# Patient Record
Sex: Female | Born: 1995 | Race: Black or African American | Hispanic: Refuse to answer | Smoking: Current every day smoker
Health system: Southern US, Community
[De-identification: ages and names within clinical notes are randomized; demographics above are authoritative.]

## PROBLEM LIST (undated history)

## (undated) ENCOUNTER — Inpatient Hospital Stay (HOSPITAL_COMMUNITY): Payer: Self-pay

## (undated) ENCOUNTER — Emergency Department (HOSPITAL_COMMUNITY): Discharge status: Absent without leave | Payer: Medicaid Other

## (undated) DIAGNOSIS — O99345 Other mental disorders complicating the puerperium: Secondary | ICD-10-CM

## (undated) DIAGNOSIS — A64 Unspecified sexually transmitted disease: Secondary | ICD-10-CM

## (undated) DIAGNOSIS — F53 Postpartum depression: Secondary | ICD-10-CM

## (undated) DIAGNOSIS — Z889 Allergy status to unspecified drugs, medicaments and biological substances status: Secondary | ICD-10-CM

## (undated) HISTORY — PX: COSMETIC SURGERY: SHX468

---
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
## 2013-07-18 HISTORY — PX: WISDOM TOOTH EXTRACTION: SHX21

## 2013-08-19 ENCOUNTER — Emergency Department (HOSPITAL_COMMUNITY)
Admission: EM | Admit: 2013-08-19 | Discharge: 2013-08-20 | Disposition: A | Discharge status: Home / Self Care | Payer: Medicaid Other | Attending: Emergency Medicine | Admitting: Emergency Medicine

## 2013-08-19 ENCOUNTER — Encounter (HOSPITAL_COMMUNITY): Payer: Self-pay | Admitting: Emergency Medicine

## 2013-08-19 DIAGNOSIS — R109 Unspecified abdominal pain: Secondary | ICD-10-CM | POA: Insufficient documentation

## 2013-08-19 DIAGNOSIS — K08109 Complete loss of teeth, unspecified cause, unspecified class: Secondary | ICD-10-CM | POA: Insufficient documentation

## 2013-08-19 DIAGNOSIS — Z79899 Other long term (current) drug therapy: Secondary | ICD-10-CM | POA: Insufficient documentation

## 2013-08-19 DIAGNOSIS — K089 Disorder of teeth and supporting structures, unspecified: Secondary | ICD-10-CM | POA: Insufficient documentation

## 2013-08-19 DIAGNOSIS — K5900 Constipation, unspecified: Secondary | ICD-10-CM | POA: Insufficient documentation

## 2013-08-19 DIAGNOSIS — Z3202 Encounter for pregnancy test, result negative: Secondary | ICD-10-CM | POA: Insufficient documentation

## 2013-08-19 LAB — PREGNANCY, URINE: PREG TEST UR: NEGATIVE

## 2013-08-19 NOTE — ED Provider Notes (Signed)
CSN: [id number]     Arrival date & time 08/19/13  2233 History   First MD Initiated Contact with Patient 08/19/13 2317     Chief Complaint  Patient presents with  . Abdominal Pain  . Constipation     (Consider location/radiation/quality/duration/timing/severity/associated sxs/prior Treatment) HPI Hx per PT - presents with on and off ABD pains, sharp and aching, mid ABD and sometimes R sided, lasts for 1-2 minutes at a time, strong pain when it is presents.  Has had constipation for a long time, takes medications per PCP.  PT expresses worry about using Miralax daily.  She also expresses concern about her Gallbladder b/c her mother had her GB removed.  No F/C. Some intermittent nausea no emesis. Last BM 3 days ago with not a hard stool. No blood in stools. No pain right now. Taking vicodin currently for dental pain, recently had wisdom teeth removed.    Past Medical History  Diagnosis Date  . Postpartum depression    Past Surgical History  Procedure Laterality Date  . Wisdom tooth extraction Bilateral 07/2013    x4   History reviewed. No pertinent family history. History  Substance Use Topics  . Smoking status: Never Smoker   . Smokeless tobacco: Never Used  . Alcohol Use: No   OB History   Grav Para Term Preterm Abortions TAB SAB Ect Mult Living   1 1 1  0      1     Review of Systems  Constitutional: Negative for fever and chills.  Eyes: Negative for visual disturbance.  Respiratory: Negative for shortness of breath.   Cardiovascular: Negative for chest pain.  Gastrointestinal: Positive for abdominal pain. Negative for vomiting.  Genitourinary: Negative for dysuria.  Musculoskeletal: Negative for back pain.  Skin: Negative for rash.  Neurological: Negative for headaches.  All other systems reviewed and are negative.      Allergies  Review of patient's allergies indicates no known allergies.  Home Medications   Current Outpatient Rx  Name  Route  Sig  Dispense   Refill  . HYDROcodone-acetaminophen (NORCO/VICODIN) 5-325 MG per tablet   Oral   Take 1 tablet by mouth every 6 (six) hours as needed for moderate pain.         Marland Kitchen. ibuprofen (ADVIL,MOTRIN) 200 MG tablet   Oral   Take 800 mg by mouth every 6 (six) hours as needed for moderate pain.          BP 121/82  Pulse 74  Temp(Src) 98.9 F (37.2 C) (Oral)  Resp 16  SpO2 100% Physical Exam  Constitutional: She is oriented to person, place, and time. She appears well-developed and well-nourished.  HENT:  Head: Normocephalic and atraumatic.  Mouth/Throat: Oropharynx is clear and moist.  Eyes: EOM are normal. Pupils are equal, round, and reactive to light. No scleral icterus.  Neck: Neck supple.  Cardiovascular: Normal rate, regular rhythm and intact distal pulses.   Pulmonary/Chest: Effort normal and breath sounds normal. No respiratory distress.  Abdominal: Soft. Bowel sounds are normal. She exhibits no distension and no mass. There is no tenderness. There is no rebound and no guarding.  No CVAT  Musculoskeletal: Normal range of motion. She exhibits no edema.  Neurological: She is alert and oriented to person, place, and time. No cranial nerve deficit.  Skin: Skin is warm and dry.    ED Course  Procedures (including critical care time) Labs Review Labs Reviewed  URINALYSIS, ROUTINE W REFLEX MICROSCOPIC - Abnormal; Notable for  the following:    Hgb urine dipstick MODERATE (*)    All other components within normal limits  PREGNANCY, URINE  URINE MICROSCOPIC-ADD ON   Imaging Review No results found.  No current pain, abdominal tenderness or acute ABD.   Plan d/c home, continue Miralax as needed, PT agrees to ween herself from vicodin and take motrin as needed.  Return precautions provided and verbalized as understood.   MDM   Dx: Abdominal pain, h/o constipation  Urinalysis obtained and reviewed, no UTI.  No acute abdomen or indication for imaging at this time.  Vital signs  and nursing notes reviewed and considered.   Sunnie Nielsen, MD 08/20/13 [phone]

## 2013-08-19 NOTE — ED Notes (Signed)
MD at bedside. 

## 2013-08-19 NOTE — ED Notes (Signed)
Pt reports "shocking" pain to lower abdomen with constipation, states LBM unknown "but it's been a long while" was given medication by PCP for relief but has been unable to have a bowel movement. Pt reports nausea, denies vomiting. Pt also reports painful "bumps that pop and come back" to vaginal area. Pt a&o x4, NAD noted at this time.

## 2013-08-20 LAB — URINALYSIS, ROUTINE W REFLEX MICROSCOPIC
Bilirubin Urine: NEGATIVE
GLUCOSE, UA: NEGATIVE mg/dL
Ketones, ur: NEGATIVE mg/dL
Leukocytes, UA: NEGATIVE
Nitrite: NEGATIVE
PH: 7.5 (ref 5.0–8.0)
Protein, ur: NEGATIVE mg/dL
SPECIFIC GRAVITY, URINE: 1.022 (ref 1.005–1.030)
UROBILINOGEN UA: 1 mg/dL (ref 0.0–1.0)

## 2013-08-20 LAB — URINE MICROSCOPIC-ADD ON

## 2013-08-20 NOTE — Discharge Instructions (Signed)
Abdominal Pain, Adult °Many things can cause abdominal pain. Usually, abdominal pain is not caused by a disease and will improve without treatment. It can often be observed and treated at home. Your health care provider will do a physical exam and possibly order blood tests and X-rays to help determine the seriousness of your pain. However, in many cases, more time must pass before a clear cause of the pain can be found. Before that point, your health care provider may not know if you need more testing or further treatment. °HOME CARE INSTRUCTIONS  °Monitor your abdominal pain for any changes. The following actions may help to alleviate any discomfort you are experiencing: °· Only take over-the-counter or prescription medicines as directed by your health care provider. °· Do not take laxatives unless directed to do so by your health care provider. °· Try a clear liquid diet (broth, tea, or water) as directed by your health care provider. Slowly move to a bland diet as tolerated. °SEEK MEDICAL CARE IF: °· You have unexplained abdominal pain. °· You have abdominal pain associated with nausea or diarrhea. °· You have pain when you urinate or have a bowel movement. °· You experience abdominal pain that wakes you in the night. °· You have abdominal pain that is worsened or improved by eating food. °· You have abdominal pain that is worsened with eating fatty foods. °SEEK IMMEDIATE MEDICAL CARE IF:  °· Your pain does not go away within 2 hours. °· You have a fever. °· You keep throwing up (vomiting). °· Your pain is felt only in portions of the abdomen, such as the right side or the left lower portion of the abdomen. °· You pass bloody or black tarry stools. °MAKE SURE YOU: °· Understand these instructions.   °· Will watch your condition.   °· Will get help right away if you are not doing well or get worse.   °Document Released: 02/13/2005 Document Revised: 02/24/2013 Document Reviewed: 01/13/2013 °Exit[employer]® Patient  Information ©2014 [employer]. ° °Abdominal Pain, Women °Abdominal (stomach, pelvic, or belly) pain can be caused by many things. It is important to tell your doctor: °· The location of the pain. °· Does it come and go or is it present all the time? °· Are there things that start the pain (eating certain foods, exercise)? °· Are there other symptoms associated with the pain (fever, nausea, vomiting, diarrhea)? °All of this is helpful to know when trying to find the cause of the pain. °CAUSES  °· Stomach: virus or bacteria infection, or ulcer. °· Intestine: appendicitis (inflamed appendix), regional ileitis (Crohn's disease), ulcerative colitis (inflamed colon), irritable bowel syndrome, diverticulitis (inflamed diverticulum of the colon), or cancer of the stomach or intestine. °· Gallbladder disease or stones in the gallbladder. °· Kidney disease, kidney stones, or infection. °· Pancreas infection or cancer. °· Fibromyalgia (pain disorder). °· Diseases of the female organs: °· Uterus: fibroid (non-cancerous) tumors or infection. °· Fallopian tubes: infection or tubal pregnancy. °· Ovary: cysts or tumors. °· Pelvic adhesions (scar tissue). °· Endometriosis (uterus lining tissue growing in the pelvis and on the pelvic organs). °· Pelvic congestion syndrome (female organs filling up with blood just before the menstrual period). °· Pain with the menstrual period. °· Pain with ovulation (producing an egg). °· Pain with an IUD (intrauterine device, birth control) in the uterus. °· Cancer of the female organs. °· Functional pain (pain not caused by a disease, may improve without treatment). °· Psychological pain. °· Depression. °DIAGNOSIS  °Your doctor   will decide the seriousness of your pain by doing an examination. °· Blood tests. °· X-rays. °· Ultrasound. °· CT scan (computed tomography, special type of X-ray). °· MRI (magnetic resonance imaging). °· Cultures, for infection. °· Barium enema (dye inserted in the large  intestine, to better view it with X-rays). °· Colonoscopy (looking in intestine with a lighted tube). °· Laparoscopy (minor surgery, looking in abdomen with a lighted tube). °· Major abdominal exploratory surgery (looking in abdomen with a large incision). °TREATMENT  °The treatment will depend on the cause of the pain.  °· Many cases can be observed and treated at home. °· Over-the-counter medicines recommended by your caregiver. °· Prescription medicine. °· Antibiotics, for infection. °· Birth control pills, for painful periods or for ovulation pain. °· Hormone treatment, for endometriosis. °· Nerve blocking injections. °· Physical therapy. °· Antidepressants. °· Counseling with a psychologist or psychiatrist. °· Minor or major surgery. °HOME CARE INSTRUCTIONS  °· Do not take laxatives, unless directed by your caregiver. °· Take over-the-counter pain medicine only if ordered by your caregiver. Do not take aspirin because it can cause an upset stomach or bleeding. °· Try a clear liquid diet (broth or water) as ordered by your caregiver. Slowly move to a bland diet, as tolerated, if the pain is related to the stomach or intestine. °· Have a thermometer and take your temperature several times a day, and record it. °· Bed rest and sleep, if it helps the pain. °· Avoid sexual intercourse, if it causes pain. °· Avoid stressful situations. °· Keep your follow-up appointments and tests, as your caregiver orders. °· If the pain does not go away with medicine or surgery, you may try: °· Acupuncture. °· Relaxation exercises (yoga, meditation). °· Group therapy. °· Counseling. °SEEK MEDICAL CARE IF:  °· You notice certain foods cause stomach pain. °· Your home care treatment is not helping your pain. °· You need stronger pain medicine. °· You want your IUD removed. °· You feel faint or lightheaded. °· You develop nausea and vomiting. °· You develop a rash. °· You are having side effects or an allergy to your medicine. °SEEK  IMMEDIATE MEDICAL CARE IF:  °· Your pain does not go away or gets worse. °· You have a fever. °· Your pain is felt only in portions of the abdomen. The right side could possibly be appendicitis. The left lower portion of the abdomen could be colitis or diverticulitis. °· You are passing blood in your stools (bright red or black tarry stools, with or without vomiting). °· You have blood in your urine. °· You develop chills, with or without a fever. °· You pass out. °MAKE SURE YOU:  °· Understand these instructions. °· Will watch your condition. °· Will get help right away if you are not doing well or get worse. °Document Released: 03/03/2007 Document Revised: 07/29/2011 Document Reviewed: 03/23/2009 °ExitCare® Patient Information ©2014 [employer]. ° °

## 2014-01-05 ENCOUNTER — Encounter: Payer: Self-pay | Admitting: Obstetrics & Gynecology

## 2014-01-05 ENCOUNTER — Ambulatory Visit (INDEPENDENT_AMBULATORY_CARE_PROVIDER_SITE_OTHER): Payer: Medicaid Other | Admitting: Obstetrics & Gynecology

## 2014-01-05 VITALS — BP 137/81 | HR 76 | Ht 61.0 in | Wt 196.7 lb

## 2014-01-05 DIAGNOSIS — Z3009 Encounter for other general counseling and advice on contraception: Secondary | ICD-10-CM

## 2014-01-05 DIAGNOSIS — Z3046 Encounter for surveillance of implantable subdermal contraceptive: Secondary | ICD-10-CM

## 2014-01-05 NOTE — Progress Notes (Signed)
Subjective:     Patient ID: Heidi Estes, female   DOB: [birth year], 18 y.o.   MRN: [id number]  HPINexplanon in place for 12 months. Doesn't feel like herself, has recent bleeding, wants removal and no contraceptive for now. Extensively counseled to have expectant management but is adamant about her request so procedure done.   Review of Systems  Constitutional: Negative.   Genitourinary: Positive for menstrual problem. Negative for vaginal discharge and pelvic pain.  Psychiatric/Behavioral: Positive for behavioral problems.       Objective:   Physical Exam  Vitals reviewed. Constitutional: She is oriented to person, place, and time. She appears well-developed. No distress.  Pulmonary/Chest: Effort normal. No respiratory distress.  Neurological: She is alert and oriented to person, place, and time.  Skin: Skin is warm and dry.  Psychiatric: She has a normal mood and affect. Her behavior is normal.   Patient given informed consent for removal of her Nexplanon, time out was performed.  Signed copy in the chart.  Appropriate time out taken. Nexplanon site identified.  Area prepped in usual sterile fashon. One cc of 1% lidocaine was used to anesthetize the area at the distal end of the implant. A small stab incision was made right beside the implant on the distal portion.  The Nexplanon rod was grasped using hemostats and removed without difficulty.  There was less than 3 cc blood loss. There were no complications.   Steri-strips were applied over the small incision.  A pressure bandage was applied to reduce any bruising.  The patient tolerated the procedure well and was given post procedure instructions.    Assessment:     nexplanon removal     Plan:     Report problems or need for Rx contraception.  Adam PhenixJames G Catelyn Friel, MD 01/05/2014

## 2014-01-05 NOTE — Patient Instructions (Signed)
Etonogestrel implant What is this medicine? ETONOGESTREL (et oh noe JES trel) is a contraceptive (birth control) device. It is used to prevent pregnancy. It can be used for up to 3 years. This medicine may be used for other purposes; ask your health care provider or pharmacist if you have questions. COMMON BRAND NAME(S): Implanon, Nexplanon What should I tell my health care provider before I take this medicine? They need to know if you have any of these conditions: -abnormal vaginal bleeding -blood vessel disease or blood clots -cancer of the breast, cervix, or liver -depression -diabetes -gallbladder disease -headaches -heart disease or recent heart attack -high blood pressure -high cholesterol -kidney disease -liver disease -renal disease -seizures -tobacco smoker -an unusual or allergic reaction to etonogestrel, other hormones, anesthetics or antiseptics, medicines, foods, dyes, or preservatives -pregnant or trying to get pregnant -breast-feeding How should I use this medicine? This device is inserted just under the skin on the inner side of your upper arm by a health care professional. Talk to your pediatrician regarding the use of this medicine in children. Special care may be needed. Overdosage: If you think you've taken too much of this medicine contact a poison control center or emergency room at once. Overdosage: If you think you have taken too much of this medicine contact a poison control center or emergency room at once. NOTE: This medicine is only for you. Do not share this medicine with others. What if I miss a dose? This does not apply. What may interact with this medicine? Do not take this medicine with any of the following medications: -amprenavir -bosentan -fosamprenavir This medicine may also interact with the following medications: -barbiturate medicines for inducing sleep or treating seizures -certain medicines for fungal infections like ketoconazole and  itraconazole -griseofulvin -medicines to treat seizures like carbamazepine, felbamate, oxcarbazepine, phenytoin, topiramate -modafinil -phenylbutazone -rifampin -some medicines to treat HIV infection like atazanavir, indinavir, lopinavir, nelfinavir, tipranavir, ritonavir -St. John's wort This list may not describe all possible interactions. Give your health care provider a list of all the medicines, herbs, non-prescription drugs, or dietary supplements you use. Also tell them if you smoke, drink alcohol, or use illegal drugs. Some items may interact with your medicine. What should I watch for while using this medicine? This product does not protect you against HIV infection (AIDS) or other sexually transmitted diseases. You should be able to feel the implant by pressing your fingertips over the skin where it was inserted. Tell your doctor if you cannot feel the implant. What side effects may I notice from receiving this medicine? Side effects that you should report to your doctor or health care professional as soon as possible: -allergic reactions like skin rash, itching or hives, swelling of the face, lips, or tongue -breast lumps -changes in vision -confusion, trouble speaking or understanding -dark urine -depressed mood -general ill feeling or flu-like symptoms -light-colored stools -loss of appetite, nausea -right upper belly pain -severe headaches -severe pain, swelling, or tenderness in the abdomen -shortness of breath, chest pain, swelling in a leg -signs of pregnancy -sudden numbness or weakness of the face, arm or leg -trouble walking, dizziness, loss of balance or coordination -unusual vaginal bleeding, discharge -unusually weak or tired -yellowing of the eyes or skin Side effects that usually do not require medical attention (Report these to your doctor or health care professional if they continue or are bothersome.): -acne -breast pain -changes in  weight -cough -fever or chills -headache -irregular menstrual bleeding -itching, burning, and   vaginal discharge -pain or difficulty passing urine -sore throat This list may not describe all possible side effects. Call your doctor for medical advice about side effects. You may report side effects to FDA at 1-800-FDA-1088. Where should I keep my medicine? This drug is given in a hospital or clinic and will not be stored at home. NOTE: This sheet is a summary. It may not cover all possible information. If you have questions about this medicine, talk to your doctor, pharmacist, or health care provider.  2015, [employer]. (2011-11-11 15:37:45)  

## 2014-01-07 ENCOUNTER — Encounter: Payer: Self-pay | Admitting: General Practice

## 2014-03-04 ENCOUNTER — Emergency Department (HOSPITAL_COMMUNITY)
Admission: EM | Admit: 2014-03-04 | Discharge: 2014-03-04 | Disposition: A | Discharge status: Home / Self Care | Payer: Medicaid Other | Attending: Emergency Medicine | Admitting: Emergency Medicine

## 2014-03-04 ENCOUNTER — Encounter (HOSPITAL_COMMUNITY): Payer: Self-pay | Admitting: Emergency Medicine

## 2014-03-04 DIAGNOSIS — Z202 Contact with and (suspected) exposure to infections with a predominantly sexual mode of transmission: Secondary | ICD-10-CM | POA: Insufficient documentation

## 2014-03-04 DIAGNOSIS — K644 Residual hemorrhoidal skin tags: Secondary | ICD-10-CM | POA: Insufficient documentation

## 2014-03-04 DIAGNOSIS — K5900 Constipation, unspecified: Secondary | ICD-10-CM | POA: Insufficient documentation

## 2014-03-04 DIAGNOSIS — N760 Acute vaginitis: Secondary | ICD-10-CM | POA: Insufficient documentation

## 2014-03-04 DIAGNOSIS — B001 Herpesviral vesicular dermatitis: Secondary | ICD-10-CM | POA: Insufficient documentation

## 2014-03-04 DIAGNOSIS — B9689 Other specified bacterial agents as the cause of diseases classified elsewhere: Secondary | ICD-10-CM

## 2014-03-04 DIAGNOSIS — N898 Other specified noninflammatory disorders of vagina: Secondary | ICD-10-CM

## 2014-03-04 DIAGNOSIS — A6004 Herpesviral vulvovaginitis: Secondary | ICD-10-CM | POA: Diagnosis not present

## 2014-03-04 DIAGNOSIS — Z3202 Encounter for pregnancy test, result negative: Secondary | ICD-10-CM | POA: Insufficient documentation

## 2014-03-04 DIAGNOSIS — A6009 Herpesviral infection of other urogenital tract: Secondary | ICD-10-CM

## 2014-03-04 DIAGNOSIS — Z711 Person with feared health complaint in whom no diagnosis is made: Secondary | ICD-10-CM

## 2014-03-04 LAB — WET PREP, GENITAL
TRICH WET PREP: NONE SEEN
YEAST WET PREP: NONE SEEN

## 2014-03-04 LAB — URINALYSIS, ROUTINE W REFLEX MICROSCOPIC
Bilirubin Urine: NEGATIVE
Glucose, UA: NEGATIVE mg/dL
HGB URINE DIPSTICK: NEGATIVE
Ketones, ur: NEGATIVE mg/dL
Leukocytes, UA: NEGATIVE
NITRITE: NEGATIVE
PROTEIN: NEGATIVE mg/dL
Specific Gravity, Urine: 1.02 (ref 1.005–1.030)
UROBILINOGEN UA: 1 mg/dL (ref 0.0–1.0)
pH: 8 (ref 5.0–8.0)

## 2014-03-04 LAB — POC URINE PREG, ED: PREG TEST UR: NEGATIVE

## 2014-03-04 MED ORDER — CEFTRIAXONE SODIUM 250 MG IJ SOLR
250.0000 mg | Freq: Once | INTRAMUSCULAR | Status: AC
  Administered 2014-03-04: 250 mg via INTRAMUSCULAR
  Filled 2014-03-04: qty 250

## 2014-03-04 MED ORDER — POLYETHYLENE GLYCOL 3350 17 G PO PACK: 17.0000 g | PACK | Freq: Every day | ORAL | Status: DC

## 2014-03-04 MED ORDER — VALACYCLOVIR HCL 1 G PO TABS: 1000.0000 mg | ORAL_TABLET | Freq: Two times a day (BID) | ORAL | Status: AC

## 2014-03-04 MED ORDER — CEFTRIAXONE SODIUM 250 MG IJ SOLR: 250.0000 mg | Freq: Once | INTRAMUSCULAR | Status: DC

## 2014-03-04 MED ORDER — HYDROCORTISONE ACETATE 25 MG RE SUPP: 25.0000 mg | Freq: Two times a day (BID) | RECTAL | Status: DC

## 2014-03-04 MED ORDER — LIDOCAINE HCL 1 % IJ SOLN
INTRAMUSCULAR | Status: AC
  Administered 2014-03-04: 20:00:00
  Filled 2014-03-04: qty 20

## 2014-03-04 MED ORDER — AZITHROMYCIN 250 MG PO TABS
1000.0000 mg | ORAL_TABLET | Freq: Once | ORAL | Status: DC
Start: 2014-03-04 — End: 2014-03-04

## 2014-03-04 MED ORDER — AZITHROMYCIN 250 MG PO TABS
1000.0000 mg | ORAL_TABLET | Freq: Once | ORAL | Status: AC
  Administered 2014-03-04: 1000 mg via ORAL
  Filled 2014-03-04: qty 4

## 2014-03-04 MED ORDER — METRONIDAZOLE 500 MG PO TABS: 500.0000 mg | ORAL_TABLET | Freq: Two times a day (BID) | ORAL | Status: DC

## 2014-03-04 NOTE — Discharge Instructions (Signed)
Follow up with [facility] Department STD clinic for future STD concerns or screenings. This is the recommendation by the CDC for people with multiple sexual partners or hx of STDs. You have been treated for gonorrhea and chlamydia in the ER but the hospital will call you if lab is positive. You were tested for HIV and Syphilis, and the hospital will call you if the lab is positive. You're being treated for herpes given your symptoms, take valtrex as directed for this. For your constipation and recurrent hemorrhoids, increased your fiber and water intake, use miralax daily until you're having regular bowel movements. See the OBGYN listed above for ongoing management of your possible herpes. Return to the ER for changes or worsening symptoms.   Bacterial Vaginosis Bacterial vaginosis is a vaginal infection that occurs when the normal balance of bacteria in the vagina is disrupted. It results from an overgrowth of certain bacteria. This is the most common vaginal infection in women of childbearing age. Treatment is important to prevent complications, especially in pregnant women, as it can cause a premature delivery. CAUSES  Bacterial vaginosis is caused by an increase in harmful bacteria that are normally present in smaller amounts in the vagina. Several different kinds of bacteria can cause bacterial vaginosis. However, the reason that the condition develops is not fully understood. RISK FACTORS Certain activities or behaviors can put you at an increased risk of developing bacterial vaginosis, including:  Having a new sex partner or multiple sex partners.  Douching.  Using an intrauterine device (IUD) for contraception. Women do not get bacterial vaginosis from toilet seats, bedding, swimming pools, or contact with objects around them. SIGNS AND SYMPTOMS  Some women with bacterial vaginosis have no signs or symptoms. Common symptoms include:  Grey vaginal discharge.  A fishlike odor  with discharge, especially after sexual intercourse.  Itching or burning of the vagina and vulva.  Burning or pain with urination. DIAGNOSIS  Your health care provider will take a medical history and examine the vagina for signs of bacterial vaginosis. A sample of vaginal fluid may be taken. Your health care provider will look at this sample under a microscope to check for bacteria and abnormal cells. A vaginal pH test may also be done.  TREATMENT  Bacterial vaginosis may be treated with antibiotic medicines. These may be given in the form of a pill or a vaginal cream. A second round of antibiotics may be prescribed if the condition comes back after treatment.  HOME CARE INSTRUCTIONS   Only take over-the-counter or prescription medicines as directed by your health care provider.  If antibiotic medicine was prescribed, take it as directed. Make sure you finish it even if you start to feel better.  Do not have sex until treatment is completed.  Tell all sexual partners that you have a vaginal infection. They should see their health care provider and be treated if they have problems, such as a mild rash or itching.  Practice safe sex by using condoms and only having one sex partner. SEEK MEDICAL CARE IF:   Your symptoms are not improving after 3 days of treatment.  You have increased discharge or pain.  You have a fever. MAKE SURE YOU:   Understand these instructions.  Will watch your condition.  Will get help right away if you are not doing well or get worse. FOR MORE INFORMATION  Centers for Disease Control and Prevention, Division of STD Prevention: SolutionApps.co.za American Sexual Health Association (ASHA): www.ashastd.org  Document Released: 05/06/2005 Document Revised: 02/24/2013 Document Reviewed: 12/16/2012 [facility] Patient Information 2015 [place], [place]. This information is not intended to replace advice given to you by your health care provider. Make sure you discuss any  questions you have with your health care provider.  Constipation Constipation is when a person:  Poops (has a bowel movement) less than 3 times a week.  Has a hard time pooping.  Has poop that is dry, hard, or bigger than normal. HOME CARE   Eat foods with a lot of fiber in them. This includes fruits, vegetables, beans, and whole grains such as brown rice.  Avoid fatty foods and foods with a lot of sugar. This includes french fries, hamburgers, cookies, candy, and soda.  If you are not getting enough fiber from food, take products with added fiber in them (supplements).  Drink enough fluid to keep your pee (urine) clear or pale yellow.  Exercise on a regular basis, or as told by your doctor.  Go to the restroom when you feel like you need to poop. Do not hold it.  Only take medicine as told by your doctor. Do not take medicines that help you poop (laxatives) without talking to your doctor first. GET HELP RIGHT AWAY IF:   You have bright red blood in your poop (stool).  Your constipation lasts more than 4 days or gets worse.  You have belly (abdominal) or butt (rectal) pain.  You have thin poop (as thin as a pencil).  You lose weight, and it cannot be explained. MAKE SURE YOU:   Understand these instructions.  Will watch your condition.  Will get help right away if you are not doing well or get worse. Document Released: 10/23/2007 Document Revised: 05/11/2013 Document Reviewed: 02/15/2013 [facility] Patient Information 2015 [place]Care, [place]. This information is not intended to replace advice given to you by your health care provider. Make sure you discuss any questions you have with your health care provider.  Hemorrhoids Hemorrhoids are swollen veins around the rectum or anus. There are two types of hemorrhoids:   Internal hemorrhoids. These occur in the veins just inside the rectum. They may poke through to the outside and become irritated and painful.  External  hemorrhoids. These occur in the veins outside the anus and can be felt as a painful swelling or hard lump near the anus. CAUSES  Pregnancy.   Obesity.   Constipation or diarrhea.   Straining to have a bowel movement.   Sitting for long periods on the toilet.  Heavy lifting or other activity that caused you to strain.  Anal intercourse. SYMPTOMS   Pain.   Anal itching or irritation.   Rectal bleeding.   Fecal leakage.   Anal swelling.   One or more lumps around the anus.  DIAGNOSIS  Your caregiver may be able to diagnose hemorrhoids by visual examination. Other examinations or tests that may be performed include:   Examination of the rectal area with a gloved hand (digital rectal exam).   Examination of anal canal using a small tube (scope).   A blood test if you have lost a significant amount of blood.  A test to look inside the colon (sigmoidoscopy or colonoscopy). TREATMENT Most hemorrhoids can be treated at home. However, if symptoms do not seem to be getting better or if you have a lot of rectal bleeding, your caregiver may perform a procedure to help make the hemorrhoids get smaller or remove them completely. Possible treatments include:  Placing a rubber band at the base of the hemorrhoid to cut off the circulation (rubber band ligation).   Injecting a chemical to shrink the hemorrhoid (sclerotherapy).   Using a tool to burn the hemorrhoid (infrared light therapy).   Surgically removing the hemorrhoid (hemorrhoidectomy).   Stapling the hemorrhoid to block blood flow to the tissue (hemorrhoid stapling).  HOME CARE INSTRUCTIONS   Eat foods with fiber, such as whole grains, beans, nuts, fruits, and vegetables. Ask your doctor about taking products with added fiber in them (fibersupplements).  Increase fluid intake. Drink enough water and fluids to keep your urine clear or pale yellow.   Exercise regularly.   Go to the bathroom when  you have the urge to have a bowel movement. Do not wait.   Avoid straining to have bowel movements.   Keep the anal area dry and clean. Use wet toilet paper or moist towelettes after a bowel movement.   Medicated creams and suppositories may be used or applied as directed.   Only take over-the-counter or prescription medicines as directed by your caregiver.   Take warm sitz baths for 15-20 minutes, 3-4 times a day to ease pain and discomfort.   Place ice packs on the hemorrhoids if they are tender and swollen. Using ice packs between sitz baths may be helpful.   Put ice in a plastic bag.   Place a towel between your skin and the bag.   Leave the ice on for 15-20 minutes, 3-4 times a day.   Do not use a donut-shaped pillow or sit on the toilet for long periods. This increases blood pooling and pain.  SEEK MEDICAL CARE IF:  You have increasing pain and swelling that is not controlled by treatment or medicine.  You have uncontrolled bleeding.  You have difficulty or you are unable to have a bowel movement.  You have pain or inflammation outside the area of the hemorrhoids. MAKE SURE YOU:  Understand these instructions.  Will watch your condition.  Will get help right away if you are not doing well or get worse. Document Released: 05/03/2000 Document Revised: 04/22/2012 Document Reviewed: 03/10/2012 [facility]ExitCare Patient Information 2015 [place]ExitCare, [place]LL[place]. This information is not intended to replace advice given to you by your health care provider. Make sure you discuss any questions you have with your health care provider.  Genital Herpes Genital herpes is a sexually transmitted disease. This means that it is a disease passed by having sex with an infected person. There is no cure for genital herpes. The time between attacks can be months to years. The virus may live in a person but produce no problems (symptoms). This infection can be passed to a baby as it travels down the  birth canal (vagina). In a newborn, this can cause central nervous system damage, eye damage, or even death. The virus that causes genital herpes is usually HSV-2 virus. The virus that causes oral herpes is usually HSV-1. The diagnosis (learning what is wrong) is made through culture results. SYMPTOMS  Usually symptoms of pain and itching begin a few days to a week after contact. It first appears as small blisters that progress to small painful ulcers which then scab over and heal after several days. It affects the outer genitalia, birth canal, cervix, penis, anal area, buttocks, and thighs. HOME CARE INSTRUCTIONS   Keep ulcerated areas dry and clean.  Take medications as directed. Antiviral medications can speed up healing. They will not prevent recurrences or cure this  infection. These medications can also be taken for suppression if there are frequent recurrences.  While the infection is active, it is contagious. Avoid all sexual contact during active infections.  Condoms may help prevent spread of the herpes virus.  Practice safe sex.  Wash your hands thoroughly after touching the genital area.  Avoid touching your eyes after touching your genital area.  Inform your caregiver if you have had genital herpes and become pregnant. It is your responsibility to insure a safe outcome for your baby in this pregnancy.  Only take over-the-counter or prescription medicines for pain, discomfort, or fever as directed by your caregiver. SEEK MEDICAL CARE IF:   You have a recurrence of this infection.  You do not respond to medications and are not improving.  You have new sources of pain or discharge which have changed from the original infection.  You have an oral temperature above 102 F (38.9 C).  You develop abdominal pain.  You develop eye pain or signs of eye infection. Document Released: 05/03/2000 Document Revised: 07/29/2011 Document Reviewed: 05/24/2009 [facility] Patient  Information 2015 [place], [place]. This information is not intended to replace advice given to you by your health care provider. Make sure you discuss any questions you have with your health care provider.

## 2014-03-04 NOTE — ED Notes (Signed)
PA at bedside Pt alert and oriented x4. Respirations even and unlabored, bilateral symmetrical rise and fall of chest. Skin warm and dry. In no acute distress. Denies needs.   

## 2014-03-04 NOTE — ED Notes (Signed)
Pt c/o brown and white vaginal discharge without odor, swollen anus for couple of days now that she has noticed after having protection couple weeks ago.  Pt also noticed today bumps on her upper lip.  Pt states that she has been urinating more frequently than usual.

## 2014-03-04 NOTE — ED Provider Notes (Signed)
CSN: [id number]     Arrival date & time 03/04/14  1610 History   First MD Initiated Contact with Patient 03/04/14 1739     Chief Complaint  Patient presents with  . Vaginal Discharge  . buttock swollen   . bumps on lip      (Consider location/radiation/quality/duration/timing/severity/associated sxs/prior Treatment) HPI Comments: Heidi Estes is a [age] y.o. female with a PMHx of postpartum depression, who presents to the ED with complaints of 2 weeks of brownish white vaginal discharge as well as "bumps" on her labia x1 week and a concern for STD. Pt states she was recently sexually active with a new partner that started 2 wks ago, and during one sexual encounter she noticed he had removed the condom. Vaginal and oral penetration only, and these were unprotected. No other sexual partners in last 1 yr. States she has had the vaginal discharge since starting to have sex with this partner, denies odor to the discharge, denies vaginal itching or dyspareunia. The labial "bumps" occasionally tingle and burn, and she noticed them 1 week ago. She also noticed on her mouth she was getting cold sores intermittently which get better on their own. She also reports that she's been constipated recently and sometimes she has anal itching and a swollen area near her anus that will occasionally bleed on the toilet paper (bright red), but this resolves on it's own. Denies fevers, chills, CP, SOB, abd pain, n/v/d, obstipation, melena, dysuria, hematuria, urinary complaints, vaginal itching, myalgias or arthralgias. Denies anal penetration or painful bowel movements.   Patient is a 18 y.o. female presenting with vaginal discharge. The history is provided by the patient. No language interpreter was used.  Vaginal Discharge Quality:  Manson PasseyBrown and white Severity:  Mild Onset quality:  Gradual Duration:  2 weeks Timing:  Constant Progression:  Unchanged Chronicity:  New Context: spontaneously   Relieved by:  None  tried Worsened by:  Nothing tried Ineffective treatments:  None tried Associated symptoms: genital lesions   Associated symptoms: no abdominal pain, no dyspareunia, no dysuria, no fever, no nausea, no urinary frequency, no urinary hesitancy, no urinary incontinence, no vaginal itching and no vomiting   Risk factors: new sexual partner and unprotected sex     Past Medical History  Diagnosis Date  . Postpartum depression    Past Surgical History  Procedure Laterality Date  . Wisdom tooth extraction Bilateral 07/2013    x4   No family history on file. History  Substance Use Topics  . Smoking status: Never Smoker   . Smokeless tobacco: Never Used  . Alcohol Use: Yes     Comment: social   OB History   Grav Para Term Preterm Abortions TAB SAB Ect Mult Living   1 1 1  0      1     Review of Systems  Constitutional: Negative for fever and chills.  HENT: Positive for mouth sores. Negative for facial swelling.   Respiratory: Negative for shortness of breath.   Cardiovascular: Negative for chest pain.  Gastrointestinal: Positive for constipation and anal bleeding. Negative for nausea, vomiting, abdominal pain, diarrhea, blood in stool and rectal pain.  Genitourinary: Positive for vaginal discharge and genital sores. Negative for bladder incontinence, dysuria, hesitancy, urgency, hematuria, flank pain, vaginal bleeding, vaginal pain, pelvic pain and dyspareunia.  Musculoskeletal: Negative for arthralgias, back pain and myalgias.  Skin: Negative for color change.  Neurological: Negative for weakness, light-headedness and numbness.  Hematological: Negative for adenopathy.  10 Systems reviewed and are negative for acute change except as noted in the HPI.   Allergies  Review of patient's allergies indicates no known allergies.  Home Medications   Prior to Admission medications   Not on File   BP 146/94  Pulse 88  Temp(Src) 98.7 F (37.1 C) (Oral)  Resp 16  SpO2 99%  LMP  02/02/2014 Physical Exam  Nursing note and vitals reviewed. Constitutional: She is oriented to person, place, and time. Vital signs are normal. She appears well-developed and well-nourished. No distress.  Afebrile, nontoxic, well appearing  HENT:  Head: Normocephalic and atraumatic.  Mouth/Throat: Uvula is midline, oropharynx is clear and moist and mucous membranes are normal. Oral lesions present.    One cold sore at angle of R lips  Eyes: Conjunctivae and EOM are normal. Right eye exhibits no discharge. Left eye exhibits no discharge.  Neck: Normal range of motion. Neck supple.  Cardiovascular: Normal rate, regular rhythm, normal heart sounds and intact distal pulses.  Exam reveals no gallop and no friction rub.   No murmur heard. Pulmonary/Chest: Effort normal and breath sounds normal. No respiratory distress. She has no decreased breath sounds. She has no wheezes. She has no rhonchi. She has no rales.  Abdominal: Soft. Normal appearance and bowel sounds are normal. She exhibits no distension. There is no tenderness. There is no rigidity, no rebound, no guarding, no CVA tenderness, no tenderness at McBurney's point and negative Murphy's sign.  Soft, NTND, +BS throughout, no r/g/r, no CVA TTP, neg murphy's, neg mcburney's  Genitourinary: Uterus normal. Rectal exam shows external hemorrhoid (old hemorrhoidal tags). Rectal exam shows no internal hemorrhoid, no fissure, no mass, no tenderness and anal tone normal. Pelvic exam was performed with patient supine. There is lesion on the right labia. There is no rash, tenderness or injury on the right labia. There is lesion on the left labia. There is no rash or injury on the left labia. Cervix exhibits discharge and friability. Cervix exhibits no motion tenderness. Right adnexum displays no mass, no tenderness and no fullness. Left adnexum displays no mass, no tenderness and no fullness. No erythema, tenderness or bleeding around the vagina. Vaginal  discharge found.  Several small vesicular lesions noted to labia majora and mons, shaved labia. No rashes or tenderness to external genitalia. No erythema, injury, or tenderness to vaginal mucosa. Mild white discharge within vaginal vault, no bleeding within vaginal vault. No adnexal masses, tenderness, or fullness. No CMT, mild cervical friability and white discharge from cervical os. Uterus non-deviated, mobile, nonTTP, and without enlargement.  Old hemorrhoidal tissue noted at anus, no protrusion with valsalva, no internal hemorrhoids palpated, no anal fissures. No masses or rectal tenderness. No fecal mass. Good anal tone.  Musculoskeletal: Normal range of motion.  Neurological: She is alert and oriented to person, place, and time. She has normal strength. No sensory deficit.  Skin: Skin is warm, dry and intact. No rash noted.  Psychiatric: She has a normal mood and affect.    ED Course  Procedures (including critical care time) Labs Review Labs Reviewed  WET PREP, GENITAL - Abnormal; Notable for the following:    Clue Cells Wet Prep HPF POC FEW (*)    WBC, Wet Prep HPF POC FEW (*)    All other components within normal limits  GC/CHLAMYDIA PROBE AMP  URINALYSIS, ROUTINE W REFLEX MICROSCOPIC  RPR  HIV ANTIBODY (ROUTINE TESTING)  POC URINE PREG, ED    Imaging Review No results found.  EKG Interpretation None      MDM   Final diagnoses:  Vaginal discharge  BV (bacterial vaginosis)  Constipation, unspecified constipation type  Hemorrhoidal skin tag  Herpes genitalis in women  Concern about STD in female without diagnosis    17y/o female with vaginal discharge complaint. Also having "bumps" on labia that tingle, and some oral cold sores. Concern for HSV given pt's sexual history of recent unprotected sex and symptoms. Will rx for valtrex 1g BID x10d. Vaginal exam showing discharge, wet prep pending. Given empiric treatment for GC/CT here given her recent unprotected sex.  Old hemorrhoidal tissue noted, no rectal mass or concern for proctitis. Rx for anusol given hemorrhoidal tissue and complaints, but no hemorrhoids today. Encouraged fluid intake and fiber intake, and miralax for constipation. Doubt PID. Doubt need for imaging today. Will await wet prep and urine.  8:28 PM U/A WNL. Upreg neg. Wet prep revealing clue cells, will treat for BV. Remaining STD panel pending. Discussed f/up with OBGYN for possible herpes, but will start valtrex today. I explained the diagnosis and have given explicit precautions to return to the ER including for any other new or worsening symptoms. The patient understands and accepts the medical plan as it's been dictated and I have answered their questions. Discharge instructions concerning home care and prescriptions have been given. The patient is STABLE and is discharged to home in good condition.   BP 147/89  Pulse 95  Temp(Src) 98.7 F (37.1 C) (Oral)  Resp 17  SpO2 100%  LMP 02/02/2014  Meds ordered this encounter  Medications  . azithromycin (ZITHROMAX) tablet 1,000 mg    Sig:   . cefTRIAXone (ROCEPHIN) injection 250 mg    Sig:     Order Specific Question:  Antibiotic Indication:    Answer:  STD  . lidocaine (XYLOCAINE) 1 % (with pres) injection    Sig:     Bulala, Lilibeth   : cabinet override  . metroNIDAZOLE (FLAGYL) 500 MG tablet    Sig: Take 1 tablet (500 mg total) by mouth 2 (two) times daily. One po bid x 7 days    Dispense:  14 tablet    Refill:  0    Order Specific Question:  Supervising Provider    Answer:  Eber Hong D [[id number]]  . polyethylene glycol (MIRALAX / GLYCOLAX) packet    Sig: Take 17 g by mouth daily.    Dispense:  14 each    Refill:  0    Order Specific Question:  Supervising Provider    Answer:  Eber Hong D [[id number]]  . hydrocortisone (ANUSOL-HC) 25 MG suppository    Sig: Place 1 suppository (25 mg total) rectally 2 (two) times daily. For 7 days    Dispense:  14 suppository    Refill:   0    Order Specific Question:  Supervising Provider    Answer:  Eber Hong D [[id number]]  . valACYclovir (VALTREX) 1000 MG tablet    Sig: Take 1 tablet (1,000 mg total) by mouth 2 (two) times daily. x10 days    Dispense:  20 tablet    Refill:  0    Order Specific Question:  Supervising Provider    Answer:  Vida Roller [address] Gizelle Whetsel Camprubi-Soms, PA-C 03/04/14 2045

## 2014-03-05 LAB — GC/CHLAMYDIA PROBE AMP
CT Probe RNA: POSITIVE — AB
GC Probe RNA: NEGATIVE

## 2014-03-05 LAB — HIV ANTIBODY (ROUTINE TESTING W REFLEX): HIV 1&2 Ab, 4th Generation: NONREACTIVE

## 2014-03-05 LAB — RPR

## 2014-03-05 NOTE — ED Provider Notes (Signed)
Medical screening examination/treatment/procedure(s) were performed by non-physician practitioner and as supervising physician I was immediately available for consultation/collaboration.  Bayler Nehring L Cadden Elizondo, MD 03/05/14 0102 

## 2014-03-06 ENCOUNTER — Telehealth (HOSPITAL_BASED_OUTPATIENT_CLINIC_OR_DEPARTMENT_OTHER): Payer: Self-pay | Admitting: Emergency Medicine

## 2014-03-06 NOTE — Telephone Encounter (Signed)
pt returned call, ID verified, Notified of positive Chlamydia culture and that treatment was given while in ED. STD instructions provided, patient verbalized understanding.

## 2014-03-06 NOTE — Telephone Encounter (Signed)
Positive Chlamydia culture Treated with Zithromax and Rocephin per protocol MD [facility] faxed  Attempt to contact patient, left voicemail to return call to office #

## 2014-03-21 ENCOUNTER — Encounter (HOSPITAL_COMMUNITY): Payer: Self-pay | Admitting: Emergency Medicine

## 2014-04-24 ENCOUNTER — Encounter (HOSPITAL_COMMUNITY): Payer: Self-pay

## 2014-04-24 ENCOUNTER — Emergency Department (HOSPITAL_COMMUNITY)
Admission: EM | Admit: 2014-04-24 | Discharge: 2014-04-24 | Disposition: A | Discharge status: Home / Self Care | Payer: Medicaid Other | Attending: Emergency Medicine | Admitting: Emergency Medicine

## 2014-04-24 DIAGNOSIS — Z202 Contact with and (suspected) exposure to infections with a predominantly sexual mode of transmission: Secondary | ICD-10-CM | POA: Diagnosis not present

## 2014-04-24 DIAGNOSIS — Z8659 Personal history of other mental and behavioral disorders: Secondary | ICD-10-CM | POA: Insufficient documentation

## 2014-04-24 DIAGNOSIS — N898 Other specified noninflammatory disorders of vagina: Secondary | ICD-10-CM | POA: Diagnosis not present

## 2014-04-24 DIAGNOSIS — L293 Anogenital pruritus, unspecified: Secondary | ICD-10-CM | POA: Diagnosis present

## 2014-04-24 DIAGNOSIS — Z3202 Encounter for pregnancy test, result negative: Secondary | ICD-10-CM | POA: Diagnosis not present

## 2014-04-24 DIAGNOSIS — Z792 Long term (current) use of antibiotics: Secondary | ICD-10-CM | POA: Insufficient documentation

## 2014-04-24 DIAGNOSIS — Z8619 Personal history of other infectious and parasitic diseases: Secondary | ICD-10-CM | POA: Diagnosis not present

## 2014-04-24 DIAGNOSIS — Z79899 Other long term (current) drug therapy: Secondary | ICD-10-CM | POA: Insufficient documentation

## 2014-04-24 DIAGNOSIS — Z7952 Long term (current) use of systemic steroids: Secondary | ICD-10-CM | POA: Diagnosis not present

## 2014-04-24 LAB — WET PREP, GENITAL
Clue Cells Wet Prep HPF POC: NONE SEEN
TRICH WET PREP: NONE SEEN
Yeast Wet Prep HPF POC: NONE SEEN

## 2014-04-24 LAB — URINALYSIS, ROUTINE W REFLEX MICROSCOPIC
BILIRUBIN URINE: NEGATIVE
Glucose, UA: NEGATIVE mg/dL
Hgb urine dipstick: NEGATIVE
KETONES UR: NEGATIVE mg/dL
Nitrite: NEGATIVE
Protein, ur: NEGATIVE mg/dL
Specific Gravity, Urine: 1.013 (ref 1.005–1.030)
Urobilinogen, UA: 0.2 mg/dL (ref 0.0–1.0)
pH: 7 (ref 5.0–8.0)

## 2014-04-24 LAB — URINE MICROSCOPIC-ADD ON

## 2014-04-24 LAB — POC URINE PREG, ED: PREG TEST UR: NEGATIVE

## 2014-04-24 LAB — HIV ANTIBODY (ROUTINE TESTING W REFLEX): HIV: NONREACTIVE

## 2014-04-24 MED ORDER — CEFTRIAXONE SODIUM 250 MG IJ SOLR
250.0000 mg | Freq: Once | INTRAMUSCULAR | Status: AC
  Administered 2014-04-24: 250 mg via INTRAMUSCULAR
  Filled 2014-04-24: qty 250

## 2014-04-24 MED ORDER — METRONIDAZOLE 500 MG PO TABS
2000.0000 mg | ORAL_TABLET | ORAL | Status: AC
  Administered 2014-04-24: 2000 mg via ORAL
  Filled 2014-04-24: qty 4

## 2014-04-24 MED ORDER — LIDOCAINE HCL 1 % IJ SOLN
INTRAMUSCULAR | Status: AC
  Administered 2014-04-24: 2 mL
  Filled 2014-04-24: qty 20

## 2014-04-24 MED ORDER — AZITHROMYCIN 250 MG PO TABS
1000.0000 mg | ORAL_TABLET | Freq: Once | ORAL | Status: AC
  Administered 2014-04-24: 1000 mg via ORAL
  Filled 2014-04-24: qty 4

## 2014-04-24 MED ORDER — DOXYCYCLINE HYCLATE 100 MG PO CAPS: 100.0000 mg | ORAL_CAPSULE | Freq: Two times a day (BID) | ORAL | Status: AC

## 2014-04-24 NOTE — ED Notes (Signed)
Before walking pt back to the room, she reports that she did suffer from postpartum depression and she feels like those symptoms are coming back as well. Nurse made aware.

## 2014-04-24 NOTE — ED Notes (Signed)
Pt presents with c/o vaginal irritation and exposure to STD. Pt reports she had unprotected sex approx one day ago and wants to be checked for an STD. Treated for chlamydia approx one month ago. Pt denies any vaginal discharge at this time.

## 2014-04-24 NOTE — ED Notes (Signed)
Pelvic cart set up at bedside  

## 2014-04-24 NOTE — ED Provider Notes (Signed)
CSN: [id number]     Arrival date & time 04/24/14  1634 History   First MD Initiated Contact with Patient 04/24/14 1646     Chief Complaint  Patient presents with  . Vaginal irritation    . Exposure to STD     (Consider location/radiation/quality/duration/timing/severity/associated sxs/prior Treatment) Patient is a 18 y.o. female presenting with female genitourinary complaint. The history is provided by the patient.  Female GU Problem This is a new problem. The current episode started more than 2 days ago. The problem occurs constantly. The problem has not changed since onset.Nothing aggravates the symptoms. Nothing relieves the symptoms. She has tried nothing for the symptoms. The treatment provided no relief.    Past Medical History  Diagnosis Date  . Postpartum depression    Past Surgical History  Procedure Laterality Date  . Wisdom tooth extraction Bilateral 07/2013    x4   No family history on file. History  Substance Use Topics  . Smoking status: Never Smoker   . Smokeless tobacco: Never Used  . Alcohol Use: Yes     Comment: social   OB History    Gravida Para Term Preterm AB TAB SAB Ectopic Multiple Living   1 1 1  0      1     Review of Systems  Constitutional: Negative for fever and fatigue.  HENT: Negative for congestion and drooling.   Eyes: Negative for pain.  Respiratory: Negative for cough.   Gastrointestinal: Negative for nausea, vomiting and diarrhea.  Genitourinary: Positive for vaginal discharge and vaginal pain. Negative for dysuria and hematuria.  Musculoskeletal: Negative for back pain, gait problem and neck pain.  Skin: Negative for color change.  Neurological: Negative for dizziness.  Hematological: Negative for adenopathy.  Psychiatric/Behavioral: Negative for behavioral problems.  All other systems reviewed and are negative.     Allergies  Review of patient's allergies indicates no known allergies.  Home Medications   Prior to  Admission medications   Medication Sig Start Date End Date Taking? Authorizing Provider  hydrocortisone (ANUSOL-HC) 25 MG suppository Place 1 suppository (25 mg total) rectally 2 (two) times daily. For 7 days 03/04/14   Donnita FallsMercedes Strupp Camprubi-Soms, PA-C  metroNIDAZOLE (FLAGYL) 500 MG tablet Take 1 tablet (500 mg total) by mouth 2 (two) times daily. One po bid x 7 days 03/04/14   Donnita FallsMercedes Strupp Camprubi-Soms, PA-C  polyethylene glycol (MIRALAX / GLYCOLAX) packet Take 17 g by mouth daily. 03/04/14   Mercedes Strupp Camprubi-Soms, PA-C   BP 136/95 mmHg  Pulse 85  Temp(Src) 97.9 F (36.6 C) (Oral)  Resp 18  SpO2 100%  LMP 04/13/2014 (Approximate) Physical Exam  Constitutional: She is oriented to person, place, and time. She appears well-developed and well-nourished.  HENT:  Head: Normocephalic and atraumatic.  Mouth/Throat: Oropharynx is clear and moist. No oropharyngeal exudate.  Eyes: Conjunctivae and EOM are normal. Pupils are equal, round, and reactive to light.  Neck: Normal range of motion. Neck supple.  Cardiovascular: Normal rate, regular rhythm, normal heart sounds and intact distal pulses.  Exam reveals no gallop and no friction rub.   No murmur heard. Pulmonary/Chest: Effort normal and breath sounds normal. No respiratory distress. She has no wheezes.  Abdominal: Soft. Bowel sounds are normal. There is no tenderness. There is no rebound and no guarding.  Genitourinary:  Normal-appearing external vagina. Normal-appearing cervix. Small amount of milky thick fluid in the posterior fornix. Os closed. No cervical motion tenderness. Mild diffuse tenderness during the bimanual.  Musculoskeletal:  Normal range of motion. She exhibits no edema or tenderness.  Neurological: She is alert and oriented to person, place, and time.  Skin: Skin is warm and dry.  Psychiatric: She has a normal mood and affect. Her behavior is normal.  Nursing note and vitals reviewed.   ED Course  Procedures  (including critical care time) Labs Review Labs Reviewed  WET PREP, GENITAL - Abnormal; Notable for the following:    WBC, Wet Prep HPF POC TOO NUMEROUS TO COUNT (*)    All other components within normal limits  URINALYSIS, ROUTINE W REFLEX MICROSCOPIC - Abnormal; Notable for the following:    APPearance CLOUDY (*)    Leukocytes, UA SMALL (*)    All other components within normal limits  GC/CHLAMYDIA PROBE AMP  URINE MICROSCOPIC-ADD ON  HIV ANTIBODY (ROUTINE TESTING)  POC URINE PREG, ED    Imaging Review No results found.   EKG Interpretation None      MDM   Final diagnoses:  Vaginal discharge    5:15 PM 18 y.o. female with a self-reported history of chlamydia which was treated one month ago who presents with vaginal discharge and vaginal irritation 1 week. She notes that she had unprotected sex yesterday with a known partner. She is afebrile and vital signs are unremarkable here. She would like impaired treatment for STDs. I also offered HIV screening and she would like that as well. Will perform pelvic exam and screening urinalysis.  6:00 PM: Given mild pain during the bimanual exam and intermittent lower pelvic pain which she has endorsed over the last week, will cover for PID with doxycycline. I have discussed the diagnosis/risks/treatment options with the patient and believe the pt to be eligible for discharge home to follow-up with his pcp. We also discussed returning to the ED immediately if new or worsening sx occur. We discussed the sx which are most concerning (e.g., worsening pain, fever, continued vag d/c) that necessitate immediate return. Medications administered to the patient during their visit and any new prescriptions provided to the patient are listed below.  Medications given during this visit Medications  cefTRIAXone (ROCEPHIN) injection 250 mg (250 mg Intramuscular Given 04/24/14 1750)  metroNIDAZOLE (FLAGYL) tablet 2,000 mg (2,000 mg Oral Given 04/24/14  1750)  azithromycin (ZITHROMAX) tablet 1,000 mg (1,000 mg Oral Given 04/24/14 1750)  lidocaine (XYLOCAINE) 1 % (with pres) injection (2 mLs  Given 04/24/14 1751)    New Prescriptions   DOXYCYCLINE (VIBRAMYCIN) 100 MG CAPSULE    Take 1 capsule (100 mg total) by mouth 2 (two) times daily. One po bid x 14 days     Purvis SheffieldForrest Shakirah Kirkey, MD 04/24/14 [phone]

## 2014-04-25 LAB — GC/CHLAMYDIA PROBE AMP
CT Probe RNA: NEGATIVE
GC Probe RNA: NEGATIVE

## 2014-05-16 ENCOUNTER — Inpatient Hospital Stay (HOSPITAL_COMMUNITY)
Admission: AD | Admit: 2014-05-16 | Discharge: 2014-05-16 | Disposition: A | Discharge status: Home / Self Care | Payer: Medicaid Other | Source: Ambulatory Visit | Attending: Obstetrics & Gynecology | Admitting: Obstetrics & Gynecology

## 2014-05-16 ENCOUNTER — Encounter (HOSPITAL_COMMUNITY): Payer: Self-pay | Admitting: *Deleted

## 2014-05-16 DIAGNOSIS — B373 Candidiasis of vulva and vagina: Secondary | ICD-10-CM | POA: Diagnosis not present

## 2014-05-16 DIAGNOSIS — N899 Noninflammatory disorder of vagina, unspecified: Secondary | ICD-10-CM | POA: Diagnosis present

## 2014-05-16 DIAGNOSIS — B3731 Acute candidiasis of vulva and vagina: Secondary | ICD-10-CM

## 2014-05-16 DIAGNOSIS — B370 Candidal stomatitis: Secondary | ICD-10-CM | POA: Insufficient documentation

## 2014-05-16 LAB — WET PREP, GENITAL
CLUE CELLS WET PREP: NONE SEEN
TRICH WET PREP: NONE SEEN
Yeast Wet Prep HPF POC: NONE SEEN

## 2014-05-16 LAB — URINALYSIS, ROUTINE W REFLEX MICROSCOPIC
Glucose, UA: NEGATIVE mg/dL
KETONES UR: 40 mg/dL — AB
NITRITE: NEGATIVE
PROTEIN: NEGATIVE mg/dL
Specific Gravity, Urine: >1.03 — ABNORMAL HIGH (ref 1.005–1.030)
UROBILINOGEN UA: 0.2 mg/dL (ref 0.0–1.0)
pH: 6 (ref 5.0–8.0)

## 2014-05-16 LAB — URINE MICROSCOPIC-ADD ON

## 2014-05-16 LAB — GLUCOSE, CAPILLARY: GLUCOSE-CAPILLARY: 70 mg/dL (ref 70–99)

## 2014-05-16 LAB — HCG, SERUM, QUALITATIVE: Preg, Serum: NEGATIVE

## 2014-05-16 MED ORDER — FLUCONAZOLE 100 MG PO TABS: ORAL_TABLET | ORAL | Status: DC

## 2014-05-16 NOTE — MAU Provider Note (Signed)
Chief Complaint: Diarrhea; Vaginal Discharge; and Possible Pregnancy   First Provider Initiated Contact with Patient 05/16/14 1948     SUBJECTIVE HPI: Heidi Estes is a 18 y.o. G1P1001 who presents to maternity admissions reporting menses late by 1 week, pain with white patches in her mouth, and vaginal itching with thick white discharge.  She also has diarrhea, with family members recently sick with GI virus.  She is concerned that she may have thrush and a vaginal yeast infection.  She was positive in October for chlamydia and she and her partner were treated.  She was treated on 12/6 for possible PID with antibiotics and has been taking a course of Doxycyline to complete this treatment.  She reports being told she was borderline diabetic by a primary care provider within the last year.  She denies vaginal bleeding, urinary symptoms, h/a, dizziness, or fever/chills.  .   Past Medical History  Diagnosis Date  . Postpartum depression    Past Surgical History  Procedure Laterality Date  . Wisdom tooth extraction Bilateral 07/2013    x4   History   Social History  . Marital Status: Single    Spouse Name: N/A    Number of Children: N/A  . Years of Education: N/A   Occupational History  . Not on file.   Social History Main Topics  . Smoking status: Never Smoker   . Smokeless tobacco: Never Used  . Alcohol Use: No     Comment: social  . Drug Use: No  . Sexual Activity: Yes    Birth Control/ Protection: Condom, None   Other Topics Concern  . Not on file   Social History Narrative   No current facility-administered medications on file prior to encounter.   No current outpatient prescriptions on file prior to encounter.   No Known Allergies  ROS: Pertinent items in HPI  OBJECTIVE Blood pressure 123/89, pulse 90, temperature 99.9 F (37.7 C), temperature source Oral, resp. rate 16, last menstrual period 04/09/2014. GENERAL: Well-developed, well-nourished female in no  acute distress.  HEENT: Normocephalic Mouth: palate with mild erythema, tiny flecks of white which are mobile, with increased erythema underneath HEART: normal rate RESP: normal effort ABDOMEN: Soft, non-tender EXTREMITIES: Nontender, no edema NEURO: Alert and oriented Pelvic exam: Cervix pink, visually closed, without lesion, moderate amount thick white discharge, vaginal walls and external genitalia normal Bimanual exam: Cervix 0/long/high, firm, anterior, neg CMT, uterus nontender, nonenlarged, adnexa without tenderness, enlargement, or mass  LAB RESULTS Results for orders placed or performed during the hospital encounter of 05/16/14 (from the past 24 hour(s))  Urinalysis, Routine w reflex microscopic     Status: Abnormal   Collection Time: 05/16/14  6:55 PM  Result Value Ref Range   Color, Urine YELLOW YELLOW   APPearance CLOUDY (A) CLEAR   Specific Gravity, Urine >1.030 (H) 1.005 - 1.030   pH 6.0 5.0 - 8.0   Glucose, UA NEGATIVE NEGATIVE mg/dL   Hgb urine dipstick SMALL (A) NEGATIVE   Bilirubin Urine SMALL (A) NEGATIVE   Ketones, ur 40 (A) NEGATIVE mg/dL   Protein, ur NEGATIVE NEGATIVE mg/dL   Urobilinogen, UA 0.2 0.0 - 1.0 mg/dL   Nitrite NEGATIVE NEGATIVE   Leukocytes, UA TRACE (A) NEGATIVE  Urine microscopic-add on     Status: Abnormal   Collection Time: 05/16/14  6:55 PM  Result Value Ref Range   Squamous Epithelial / LPF MANY (A) RARE   WBC, UA 7-10 <3 WBC/hpf   RBC /  HPF 0-2 <3 RBC/hpf   Bacteria, UA MANY (A) RARE   Urine-Other MUCOUS PRESENT   Wet prep, genital     Status: Abnormal   Collection Time: 05/16/14  8:30 PM  Result Value Ref Range   Yeast Wet Prep HPF POC NONE SEEN NONE SEEN   Trich, Wet Prep NONE SEEN NONE SEEN   Clue Cells Wet Prep HPF POC NONE SEEN NONE SEEN   WBC, Wet Prep HPF POC FEW (A) NONE SEEN  Glucose, capillary     Status: None   Collection Time: 05/16/14  8:56 PM  Result Value Ref Range   Glucose-Capillary 70 70 - 99 mg/dL    ASSESSMENT 1. Oral thrush   2. Vaginal candidiasis     PLAN Discharge home Urine sent for culture Qualitative preg test pending, provider to call tonight if positive    Medication List    TAKE these medications        fluconazole 100 MG tablet  Commonly known as:  DIFLUCAN  Take 1 tablet daily for 3 days.       Follow-up Information    Please follow up.   Why:  With your Gyn provider as needed      Please follow up.   Why:  With your dental provider if oral symptoms persist      Sharen CounterLisa Leftwich-Kirby Certified Nurse-Midwife 05/16/2014  9:14 PM

## 2014-05-16 NOTE — MAU Note (Addendum)
Noticing that she has 'thrush' in her mouth.  White patches and burning in mouth for about a month.  Also noticing vaginal burning  And chunky white cheesy d/c for a month also. Diarrhea started yesterday. 6 or 7 since start, no fever, no vomiting. Everyone at home has been sick and throwing up, thinks they have a virus. Also realized she is a couple wks late with her cycle, neg HPT

## 2014-05-16 NOTE — MAU Note (Signed)
Pt also treated a month ago for Chlamydia.

## 2014-05-16 NOTE — Discharge Instructions (Signed)
Monilial Vaginitis Vaginitis in a soreness, swelling and redness (inflammation) of the vagina and vulva. Monilial vaginitis is not a sexually transmitted infection. CAUSES  Yeast vaginitis is caused by yeast (candida) that is normally found in your vagina. With a yeast infection, the candida has overgrown in number to a point that upsets the chemical balance. SYMPTOMS   White, thick vaginal discharge.  Swelling, itching, redness and irritation of the vagina and possibly the lips of the vagina (vulva).  Burning or painful urination.  Painful intercourse. DIAGNOSIS  Things that may contribute to monilial vaginitis are:  Postmenopausal and virginal states.  Pregnancy.  Infections.  Being tired, sick or stressed, especially if you had monilial vaginitis in the past.  Diabetes. Good control will help lower the chance.  Birth control pills.  Tight fitting garments.  Using bubble bath, feminine sprays, douches or deodorant tampons.  Taking certain medications that kill germs (antibiotics).  Sporadic recurrence can occur if you become ill. TREATMENT  Your caregiver will give you medication.  There are several kinds of anti monilial vaginal creams and suppositories specific for monilial vaginitis. For recurrent yeast infections, use a suppository or cream in the vagina 2 times a week, or as directed.  Anti-monilial or steroid cream for the itching or irritation of the vulva may also be used. Get your caregiver's permission.  Painting the vagina with methylene blue solution may help if the monilial cream does not work.  Eating yogurt may help prevent monilial vaginitis. HOME CARE INSTRUCTIONS   Finish all medication as prescribed.  Do not have sex until treatment is completed or after your caregiver tells you it is okay.  Take warm sitz baths.  Do not douche.  Do not use tampons, especially scented ones.  Wear cotton underwear.  Avoid tight pants and panty  hose.  Tell your sexual partner that you have a yeast infection. They should go to their caregiver if they have symptoms such as mild rash or itching.  Your sexual partner should be treated as well if your infection is difficult to eliminate.  Practice safer sex. Use condoms.  Some vaginal medications cause latex condoms to fail. Vaginal medications that harm condoms are:  Cleocin cream.  Butoconazole (Femstat).  Terconazole (Terazol) vaginal suppository.  Miconazole (Monistat) (may be purchased over the counter). SEEK MEDICAL CARE IF:   You have a temperature by mouth above 102 F (38.9 C).  The infection is getting worse after 2 days of treatment.  The infection is not getting better after 3 days of treatment.  You develop blisters in or around your vagina.  You develop vaginal bleeding, and it is not your menstrual period.  You have pain when you urinate.  You develop intestinal problems.  You have pain with sexual intercourse. Document Released: 02/13/2005 Document Revised: 07/29/2011 Document Reviewed: 10/28/2008 [facility] Patient Information 2015 [place], [place]. This information is not intended to replace advice given to you by your health care provider. Make sure you discuss any questions you have with your health care provider. Thrush, Adult  Ginette Pitman, also called oral candidiasis, is a fungal infection that develops in the mouth and throat and on the tongue. It causes white patches to form on the mouth and tongue. Ginette Pitman is most common in older adults, but it can occur at any age.  Many cases of thrush are mild, but this infection can also be more serious. Ginette Pitman can be a recurring problem for people who have chronic illnesses or who take medicines  that limit the body's ability to fight infection. Because these people have difficulty fighting infections, the fungus that causes thrush can spread throughout the body. This can cause life-threatening blood or organ  infections. CAUSES  Ginette Pitmanhrush is usually caused by a yeast called Candida albicans. This fungus is normally present in small amounts in the mouth and on other mucous membranes. It usually causes no harm. However, when conditions are present that allow the fungus to grow uncontrolled, it invades surrounding tissues and becomes an infection. Less often, other Candida species can also lead to thrush.  RISK FACTORS Ginette Pitmanhrush is more likely to develop in the following people:  People with an impaired ability to fight infection (weakened immune system).   Older adults.   People with HIV.   People with diabetes.   People with dry mouth (xerostomia).   Pregnant women.   People with poor dental care, especially those who have false teeth.   People who use antibiotic medicines.  SIGNS AND SYMPTOMS  Ginette Pitmanhrush can be a mild infection that causes no symptoms. If symptoms develop, they may include:   A burning feeling in the mouth and throat. This can occur at the start of a thrush infection.   White patches that adhere to the mouth and tongue. The tissue around the patches may be red, raw, and painful. If rubbed (during tooth brushing, for example), the patches and the tissue of the mouth may bleed easily.   A bad taste in the mouth or difficulty tasting foods.   Cottony feeling in the mouth.   Pain during eating and swallowing. DIAGNOSIS  Your health care provider can usually diagnose thrush by looking in your mouth and asking you questions about your health.  TREATMENT  Medicines that help prevent the growth of fungi (antifungals) are the standard treatment for thrush. These medicines are either applied directly to the affected area (topical) or swallowed (oral). The treatment will depend on the severity of the condition.  Mild Thrush Mild cases of thrush may clear up with the use of an antifungal mouth rinse or lozenges. Treatment usually lasts about 14 days.  Moderate to Severe  Thrush  More severe thrush infections that have spread to the esophagus are treated with an oral antifungal medicine. A topical antifungal medicine may also be used.   For some severe infections, a treatment period longer than 14 days may be needed.   Oral antifungal medicines are almost never used during pregnancy because the fetus may be harmed. However, if a pregnant woman has a rare, severe thrush infection that has spread to her blood, oral antifungal medicines may be used. In this case, the risk of harm to the mother and fetus from the severe thrush infection may be greater than the risk posed by the use of antifungal medicines.  Persistent or Recurrent Thrush For cases of thrush that do not go away or keep coming back, treatment may involve the following:   Treatment may be needed twice as long as the symptoms last.   Treatment will include both oral and topical antifungal medicines.   People with weakened immune systems can take an antifungal medicine on a continuous basis to prevent thrush infections.  It is important to treat conditions that make you more likely to get thrush, such as diabetes or HIV.  HOME CARE INSTRUCTIONS   Only take over-the-counter or prescription medicine as directed by your health care provider. Talk to your health care provider about an over-the-counter medicine called gentian  violet, which kills bacteria and fungi.   Eat plain, unflavored yogurt as directed by your health care provider. Check the label to make sure the yogurt contains live cultures. This yogurt can help healthy bacteria grow in the mouth that can stop the growth of the fungus that causes thrush.   Try these measures to help reduce the discomfort of thrush:   Drink cold liquids such as water or iced tea.   Try flavored ice treats or frozen juices.   Eat foods that are easy to swallow, such as gelatin, ice cream, or custard.   If the patches in your mouth are painful, try  drinking from a straw.   Rinse your mouth several times a day with a warm saltwater rinse. You can make the saltwater mixture with 1 tsp (6 g) of salt in 8 fl oz (0.2 L) of warm water.   If you wear dentures, remove the dentures before going to bed, brush them vigorously, and soak them in a cleaning solution as directed by your health care provider.   Women who are breastfeeding should clean their nipples with an antifungal medicine as directed by their health care provider. Dry the nipples after breastfeeding. Applying lanolin-containing body lotion may help relieve nipple soreness.  SEEK MEDICAL CARE IF:  Your symptoms are getting worse or are not improving within 7 days of starting treatment.   You have symptoms of spreading infection, such as white patches on the skin outside of the mouth.   You are nursing and you have redness, burning, or pain in the nipples that is not relieved with treatment.  MAKE SURE YOU:  Understand these instructions.  Will watch your condition.  Will get help right away if you are not doing well or get worse. Document Released: 01/30/2004 Document Revised: 02/24/2013 Document Reviewed: 12/07/2012 [facility] Patient Information 2015 [place]. This information is not intended to replace advice given to you by your health care provider. Make sure you discuss any questions you have with your health care provider.

## 2014-05-17 LAB — GC/CHLAMYDIA PROBE AMP
CT Probe RNA: NEGATIVE
GC PROBE AMP APTIMA: NEGATIVE

## 2014-05-17 LAB — URINE CULTURE: Colony Count: >=100000

## 2014-05-20 NOTE — L&D Delivery Note (Signed)
Delivery Note At 4:31 PM a viable female was delivered via Vaginal, Spontaneous Delivery (Presentation: Left Occiput Anterior).  APGAR: , ; weight  .   Placenta status: Intact, Spontaneous.  Cord: 3 vessels with the following complications: None.   Nuchal and body x 1 on delivery. Easily reduced.  Anesthesia: Epidural  Episiotomy: None Lacerations: None Suture Repair: n/a Est. Blood Loss (mL): 200  Mom to postpartum.  Baby to Couplet care / Skin to Skin.  Mickie Hillieran McKeag 03/04/2015, 4:44 PM

## 2014-07-09 ENCOUNTER — Emergency Department (HOSPITAL_COMMUNITY): Payer: Medicaid Other

## 2014-07-09 ENCOUNTER — Emergency Department (HOSPITAL_COMMUNITY)
Admission: EM | Admit: 2014-07-09 | Discharge: 2014-07-09 | Disposition: A | Discharge status: Home / Self Care | Payer: Medicaid Other | Attending: Emergency Medicine | Admitting: Emergency Medicine

## 2014-07-09 ENCOUNTER — Encounter (HOSPITAL_COMMUNITY): Payer: Self-pay | Admitting: Emergency Medicine

## 2014-07-09 DIAGNOSIS — O23591 Infection of other part of genital tract in pregnancy, first trimester: Secondary | ICD-10-CM | POA: Diagnosis not present

## 2014-07-09 DIAGNOSIS — Z3490 Encounter for supervision of normal pregnancy, unspecified, unspecified trimester: Secondary | ICD-10-CM

## 2014-07-09 DIAGNOSIS — Z3A01 Less than 8 weeks gestation of pregnancy: Secondary | ICD-10-CM | POA: Insufficient documentation

## 2014-07-09 DIAGNOSIS — O23511 Infections of cervix in pregnancy, first trimester: Secondary | ICD-10-CM | POA: Diagnosis not present

## 2014-07-09 DIAGNOSIS — R8271 Bacteriuria: Secondary | ICD-10-CM

## 2014-07-09 DIAGNOSIS — O9989 Other specified diseases and conditions complicating pregnancy, childbirth and the puerperium: Secondary | ICD-10-CM | POA: Diagnosis present

## 2014-07-09 DIAGNOSIS — R102 Pelvic and perineal pain: Secondary | ICD-10-CM

## 2014-07-09 DIAGNOSIS — N72 Inflammatory disease of cervix uteri: Secondary | ICD-10-CM

## 2014-07-09 DIAGNOSIS — Z79899 Other long term (current) drug therapy: Secondary | ICD-10-CM | POA: Diagnosis not present

## 2014-07-09 LAB — URINALYSIS, ROUTINE W REFLEX MICROSCOPIC
BILIRUBIN URINE: NEGATIVE
GLUCOSE, UA: NEGATIVE mg/dL
Hgb urine dipstick: NEGATIVE
KETONES UR: NEGATIVE mg/dL
Nitrite: NEGATIVE
PROTEIN: NEGATIVE mg/dL
SPECIFIC GRAVITY, URINE: 1.019 (ref 1.005–1.030)
Urobilinogen, UA: 0.2 mg/dL (ref 0.0–1.0)
pH: 7 (ref 5.0–8.0)

## 2014-07-09 LAB — CBC WITH DIFFERENTIAL/PLATELET
Basophils Absolute: 0 10*3/uL (ref 0.0–0.1)
Basophils Relative: 0 % (ref 0–1)
Eosinophils Absolute: 0.2 10*3/uL (ref 0.0–0.7)
Eosinophils Relative: 4 % (ref 0–5)
HCT: 39.2 % (ref 36.0–46.0)
Hemoglobin: 13.4 g/dL (ref 12.0–15.0)
LYMPHS PCT: 38 % (ref 12–46)
Lymphs Abs: 1.7 10*3/uL (ref 0.7–4.0)
MCH: 30.6 pg (ref 26.0–34.0)
MCHC: 34.2 g/dL (ref 30.0–36.0)
MCV: 89.5 fL (ref 78.0–100.0)
MONO ABS: 0.5 10*3/uL (ref 0.1–1.0)
MONOS PCT: 11 % (ref 3–12)
Neutro Abs: 2.1 10*3/uL (ref 1.7–7.7)
Neutrophils Relative %: 47 % (ref 43–77)
PLATELETS: 190 10*3/uL (ref 150–400)
RBC: 4.38 MIL/uL (ref 3.87–5.11)
RDW: 12.8 % (ref 11.5–15.5)
WBC: 4.6 10*3/uL (ref 4.0–10.5)

## 2014-07-09 LAB — COMPREHENSIVE METABOLIC PANEL WITH GFR
ALT: 12 U/L (ref 0–35)
AST: 15 U/L (ref 0–37)
Albumin: 3.8 g/dL (ref 3.5–5.2)
Alkaline Phosphatase: 34 U/L — ABNORMAL LOW (ref 39–117)
Anion gap: 6 (ref 5–15)
BUN: <5 mg/dL — ABNORMAL LOW (ref 6–23)
CO2: 24 mmol/L (ref 19–32)
Calcium: 9.2 mg/dL (ref 8.4–10.5)
Chloride: 104 mmol/L (ref 96–112)
Creatinine, Ser: 0.71 mg/dL (ref 0.50–1.10)
GFR calc Af Amer: >90 mL/min
GFR calc non Af Amer: >90 mL/min
Glucose, Bld: 104 mg/dL — ABNORMAL HIGH (ref 70–99)
Potassium: 3.6 mmol/L (ref 3.5–5.1)
Sodium: 134 mmol/L — ABNORMAL LOW (ref 135–145)
Total Bilirubin: 0.5 mg/dL (ref 0.3–1.2)
Total Protein: 7 g/dL (ref 6.0–8.3)

## 2014-07-09 LAB — URINE MICROSCOPIC-ADD ON

## 2014-07-09 LAB — LIPASE, BLOOD: Lipase: 24 U/L (ref 11–59)

## 2014-07-09 LAB — WET PREP, GENITAL
Clue Cells Wet Prep HPF POC: NONE SEEN
Trich, Wet Prep: NONE SEEN
Yeast Wet Prep HPF POC: NONE SEEN

## 2014-07-09 LAB — HCG, QUANTITATIVE, PREGNANCY: hCG, Beta Chain, Quant, S: 78462 m[IU]/mL — ABNORMAL HIGH

## 2014-07-09 LAB — I-STAT BETA HCG BLOOD, ED (MC, WL, AP ONLY)

## 2014-07-09 LAB — POC URINE PREG, ED: Preg Test, Ur: POSITIVE — AB

## 2014-07-09 MED ORDER — NITROFURANTOIN MONOHYD MACRO 100 MG PO CAPS: 100.0000 mg | ORAL_CAPSULE | Freq: Two times a day (BID) | ORAL | Status: DC

## 2014-07-09 MED ORDER — LIDOCAINE HCL (PF) 1 % IJ SOLN
2.0000 mL | Freq: Once | INTRAMUSCULAR | Status: AC
  Administered 2014-07-09: 0.9 mL
  Filled 2014-07-09: qty 5

## 2014-07-09 MED ORDER — CEFTRIAXONE SODIUM 250 MG IJ SOLR
250.0000 mg | Freq: Once | INTRAMUSCULAR | Status: AC
  Administered 2014-07-09: 250 mg via INTRAMUSCULAR
  Filled 2014-07-09: qty 250

## 2014-07-09 MED ORDER — AZITHROMYCIN 1 G PO PACK
1.0000 g | PACK | Freq: Once | ORAL | Status: AC
  Administered 2014-07-09: 1 g via ORAL
  Filled 2014-07-09: qty 1

## 2014-07-09 MED ORDER — ACETAMINOPHEN 500 MG PO TABS
1000.0000 mg | ORAL_TABLET | Freq: Once | ORAL | Status: AC
  Administered 2014-07-09: 1000 mg via ORAL
  Filled 2014-07-09: qty 2

## 2014-07-09 NOTE — ED Notes (Addendum)
Pt has given urine sample if needed.

## 2014-07-09 NOTE — ED Notes (Signed)
Pt from home c/o lower abdominal pain several months. She reports anal itching, vaginal discharge, and "ulcers" to vaginal area. She reports unprotected sexual intercourse with one person "this past week".

## 2014-07-09 NOTE — Discharge Instructions (Signed)
Pelvic Pain °Female pelvic pain can be caused by many different things and start from a variety of places. Pelvic pain refers to pain that is located in the lower half of the abdomen and between your hips. The pain may occur over a short period of time (acute) or may be reoccurring (chronic). The cause of pelvic pain may be related to disorders affecting the female reproductive organs (gynecologic), but it may also be related to the bladder, kidney stones, an intestinal complication, or muscle or skeletal problems. Getting help right away for pelvic pain is important, especially if there has been severe, sharp, or a sudden onset of unusual pain. It is also important to get help right away because some types of pelvic pain can be life threatening.  °CAUSES  °Below are only some of the causes of pelvic pain. The causes of pelvic pain can be in one of several categories.  °· Gynecologic. °· Pelvic inflammatory disease. °· Sexually transmitted infection. °· Ovarian cyst or a twisted ovarian ligament (ovarian torsion). °· Uterine lining that grows outside the uterus (endometriosis). °· Fibroids, cysts, or tumors. °· Ovulation. °· Pregnancy. °· Pregnancy that occurs outside the uterus (ectopic pregnancy). °· Miscarriage. °· Labor. °· Abruption of the placenta or ruptured uterus. °· Infection. °· Uterine infection (endometritis). °· Bladder infection. °· Diverticulitis. °· Miscarriage related to a uterine infection (septic abortion). °· Bladder. °· Inflammation of the bladder (cystitis). °· Kidney stone(s). °· Gastrointestinal. °· Constipation. °· Diverticulitis. °· Neurologic. °· Trauma. °· Feeling pelvic pain because of mental or emotional causes (psychosomatic). °· Cancers of the bowel or pelvis. °EVALUATION  °Your caregiver will want to take a careful history of your concerns. This includes recent changes in your health, a careful gynecologic history of your periods (menses), and a sexual history. Obtaining your family  history and medical history is also important. Your caregiver may suggest a pelvic exam. A pelvic exam will help identify the location and severity of the pain. It also helps in the evaluation of which organ system may be involved. In order to identify the cause of the pelvic pain and be properly treated, your caregiver may order tests. These tests may include:  °· A pregnancy test. °· Pelvic ultrasonography. °· An X-ray exam of the abdomen. °· A urinalysis or evaluation of vaginal discharge. °· Blood tests. °HOME CARE INSTRUCTIONS  °· Only take over-the-counter or prescription medicines for pain, discomfort, or fever as directed by your caregiver.   °· Rest as directed by your caregiver.   °· Eat a balanced diet.   °· Drink enough fluids to make your urine clear or pale yellow, or as directed.   °· Avoid sexual intercourse if it causes pain.   °· Apply warm or cold compresses to the lower abdomen depending on which one helps the pain.   °· Avoid stressful situations.   °· Keep a journal of your pelvic pain. Write down when it started, where the pain is located, and if there are things that seem to be associated with the pain, such as food or your menstrual cycle. °· Follow up with your caregiver as directed.   °SEEK MEDICAL CARE IF: °· Your medicine does not help your pain. °· You have abnormal vaginal discharge. °SEEK IMMEDIATE MEDICAL CARE IF:  °· You have heavy bleeding from the vagina.   °· Your pelvic pain increases.   °· You feel light-headed or faint.   °· You have chills.   °· You have pain with urination or blood in your urine.   °· You have uncontrolled diarrhea   or vomiting.   You have a fever or persistent symptoms for more than 3 days.  You have a fever and your symptoms suddenly get worse.   You are being physically or sexually abused.  MAKE SURE YOU:  Understand these instructions.  Will watch your condition.  Will get help if you are not doing well or get worse. Document Released:  04/02/2004 Document Revised: 09/20/2013 Document Reviewed: 08/26/2011 [facility] Patient Information 2015 [place], [place]. This information is not intended to replace advice given to you by your health care provider. Make sure you discuss any questions you have with your health care provider.  Abdominal Pain During Pregnancy Abdominal pain is common in pregnancy. Most of the time, it does not cause harm. There are many causes of abdominal pain. Some causes are more serious than others. Some of the causes of abdominal pain in pregnancy are easily diagnosed. Occasionally, the diagnosis takes time to understand. Other times, the cause is not determined. Abdominal pain can be a sign that something is very wrong with the pregnancy, or the pain may have nothing to do with the pregnancy at all. For this reason, always tell your health care provider if you have any abdominal discomfort. HOME CARE INSTRUCTIONS  Monitor your abdominal pain for any changes. The following actions may help to alleviate any discomfort you are experiencing:  Do not have sexual intercourse or put anything in your vagina until your symptoms go away completely.  Get plenty of rest until your pain improves.  Drink clear fluids if you feel nauseous. Avoid solid food as long as you are uncomfortable or nauseous.  Only take over-the-counter or prescription medicine as directed by your health care provider.  Keep all follow-up appointments with your health care provider. SEEK IMMEDIATE MEDICAL CARE IF:  You are bleeding, leaking fluid, or passing tissue from the vagina.  You have increasing pain or cramping.  You have persistent vomiting.  You have painful or bloody urination.  You have a fever.  You notice a decrease in your baby's movements.  You have extreme weakness or feel faint.  You have shortness of breath, with or without abdominal pain.  You develop a severe headache with abdominal pain.  You have abnormal  vaginal discharge with abdominal pain.  You have persistent diarrhea.  You have abdominal pain that continues even after rest, or gets worse. MAKE SURE YOU:   Understand these instructions.  Will watch your condition.  Will get help right away if you are not doing well or get worse. Document Released: 05/06/2005 Document Revised: 02/24/2013 Document Reviewed: 12/03/2012 [facility] Patient Information 2015 [place], [place]. This information is not intended to replace advice given to you by your health care provider. Make sure you discuss any questions you have with your health care provider.  Prenatal Care  WHAT IS PRENATAL CARE?  Prenatal care means health care during your pregnancy, before your baby is born. It is very important to take care of yourself and your baby during your pregnancy by:   Getting early prenatal care. If you know you are pregnant, or think you might be pregnant, call your health care provider as soon as possible. Schedule a visit for a prenatal exam.  Getting regular prenatal care. Follow your health care provider's schedule for blood and other necessary tests. Do not miss appointments.  Doing everything you can to keep yourself and your baby healthy during your pregnancy.  Getting complete care. Prenatal care should include evaluation of the medical,  dietary, educational, psychological, and social needs of you and your significant other. The medical and genetic history of your family and the family of your baby's father should be discussed with your health care provider.  Discussing with your health care provider:  Prescription, over-the-counter, and herbal medicines that you take.  Any history of substance abuse, alcohol use, smoking, and illegal drug use.  Any history of domestic abuse and violence.  Immunizations you have received.  Your nutrition and diet.  The amount of exercise you do.  Any environmental and occupational hazards to which you are  exposed.  History of sexually transmitted infections for both you and your partner.  Previous pregnancies you have had. WHY IS PRENATAL CARE SO IMPORTANT?  By regularly seeing your health care provider, you help ensure that problems can be identified early so that they can be treated as soon as possible. Other problems might be prevented. Many studies have shown that early and regular prenatal care is important for the health of mothers and their babies.  HOW CAN I TAKE CARE OF MYSELF WHILE I AM PREGNANT?  Here are ways to take care of yourself and your baby:   Start or continue taking your multivitamin with 400 micrograms (mcg) of folic acid every day.  Get early and regular prenatal care. It is very important to see a health care provider during your pregnancy. Your health care provider will check at each visit to make sure that you and your baby are healthy. If there are any problems, action can be taken right away to help you and your baby.  Eat a healthy diet that includes:  Fruits.  Vegetables.  Foods low in saturated fat.  Whole grains.  Calcium-rich foods, such as milk, yogurt, and hard cheeses.  Drink 6-8 glasses of liquids a day.  Unless your health care provider tells you not to, try to be physically active for 30 minutes, most days of the week. If you are pressed for time, you can get your activity in through 10-minute segments, three times a day.  Do not smoke, drink alcohol, or use drugs. These can cause long-term damage to your baby. Talk with your health care provider about steps to take to stop smoking. Talk with a member of your faith community, a counselor, a trusted friend, or your health care provider if you are concerned about your alcohol or drug use.  Ask your health care provider before taking any medicine, even over-the-counter medicines. Some medicines are not safe to take during pregnancy.  Get plenty of rest and sleep.  Avoid hot tubs and saunas during  pregnancy.  Do not have X-rays taken unless absolutely necessary and with the recommendation of your health care provider. A lead shield can be placed on your abdomen to protect your baby when X-rays are taken in other parts of your body.  Do not empty the cat litter when you are pregnant. It may contain a parasite that causes an infection called toxoplasmosis, which can cause birth defects. Also, use gloves when working in garden areas used by cats.  Do not eat uncooked or undercooked meats or fish.  Do not eat soft, mold-ripened cheeses (Brie, Camembert, and chevre) or soft, blue-veined cheese (Danish blue and Roquefort).  Stay away from toxic chemicals like:  Insecticides.  Solvents (some cleaners or paint thinners).  Lead.  Mercury.  Sexual intercourse may continue until the end of the pregnancy, unless you have a medical problem or there is a problem  with the pregnancy and your health care provider tells you not to.  Do not wear high-heel shoes, especially during the second half of the pregnancy. You can lose your balance and fall.  Do not take long trips, unless absolutely necessary. Be sure to see your health care provider before going on the trip.  Do not sit in one position for more than 2 hours when on a trip.  Take a copy of your medical records when going on a trip. Know where a hospital is located in the city you are visiting, in case of an emergency.  Most dangerous household products will have pregnancy warnings on their labels. Ask your health care provider about products if you are unsure.  Limit or eliminate your caffeine intake from coffee, tea, sodas, medicines, and chocolate.  Many women continue working through pregnancy. Staying active might help you stay healthier. If you have a question about the safety or the hours you work at your particular job, talk with your health care provider.  Get informed:  Read books.  Watch videos.  Go to childbirth  classes for you and your significant other.  Talk with experienced moms.  Ask your health care provider about childbirth education classes for you and your partner. Classes can help you and your partner prepare for the birth of your baby.  Ask about a baby doctor (pediatrician) and methods and pain medicine for labor, delivery, and possible cesarean delivery. HOW OFTEN SHOULD I SEE MY HEALTH CARE PROVIDER DURING PREGNANCY?  Your health care provider will give you a schedule for your prenatal visits. You will have visits more often as you get closer to the end of your pregnancy. An average pregnancy lasts about 40 weeks.  A typical schedule includes visiting your health care provider:   About once each month during your first 6 months of pregnancy.  Every 2 weeks during the next 2 months.  Weekly in the last month, until the delivery date. Your health care provider will probably want to see you more often if:  You are older than 35 years.  Your pregnancy is high risk because you have certain health problems or problems with the pregnancy, such as:  Diabetes.  High blood pressure.  The baby is not growing on schedule, according to the dates of the pregnancy. Your health care provider will do special tests to make sure you and your baby are not having any serious problems. WHAT HAPPENS DURING PRENATAL VISITS?   At your first prenatal visit, your health care provider will do a physical exam and talk to you about your health history and the health history of your partner and your family. Your health care provider will be able to tell you what date to expect your baby to be born on.  Your first physical exam will include checks of your blood pressure, measurements of your height and weight, and an exam of your pelvic organs. Your health care provider will do a Pap test if you have not had one recently and will do cultures of your cervix to make sure there is no infection.  At each  prenatal visit, there will be tests of your blood, urine, blood pressure, weight, and the progress of the baby will be checked.  At your later prenatal visits, your health care provider will check how you are doing and how your baby is developing. You may have a number of tests done as your pregnancy progresses.  Ultrasound exams are often used  to check on your baby's growth and health.  You may have more urine and blood tests, as well as special tests, if needed. These may include amniocentesis to examine fluid in the pregnancy sac, stress tests to check how the baby responds to contractions, or a biophysical profile to measure your baby's well-being. Your health care provider will explain the tests and why they are necessary.  You should be tested for high blood sugar (gestational diabetes) between the 24th and 28th weeks of your pregnancy.  You should discuss with your health care provider your plans to breastfeed or bottle-feed your baby.  Each visit is also a chance for you to learn about staying healthy during pregnancy and to ask questions. Document Released: 05/09/2003 Document Revised: 05/11/2013 Document Reviewed: 07/21/2013 [facility] Patient Information 2015 [place]. This information is not intended to replace advice given to you by your health care provider. Make sure you discuss any questions you have with your health care provider.  Asymptomatic Bacteriuria Asymptomatic bacteriuria is the presence of a large number of bacteria in your urine without the usual symptoms of burning or frequent urination. The following conditions increase the risk of asymptomatic bacteriuria:  Diabetes mellitus.  Advanced age.  Pregnancy in the first trimester.  Kidney stones.  Kidney transplants.  Leaky kidney tube valve in young children (reflux). Treatment for this condition is not needed in most people and can lead to other problems such as too much yeast and growth of resistant  bacteria. However, some people, such as pregnant women, do need treatment to prevent kidney infection. Asymptomatic bacteriuria in pregnancy is also associated with fetal growth restriction, premature labor, and newborn death. HOME CARE INSTRUCTIONS Monitor your condition for any changes. The following actions may help to relieve any discomfort you are feeling:  Drink enough water and fluids to keep your urine clear or pale yellow. Go to the bathroom more often to keep your bladder empty.  Keep the area around your vagina and rectum clean. Wipe yourself from front to back after urinating. SEEK IMMEDIATE MEDICAL CARE IF:  You develop signs of an infection such as:  Burning with urination.  Frequency of voiding.  Back pain.  Fever.  You have blood in the urine.  You develop a fever. MAKE SURE YOU:  Understand these instructions.  Will watch your condition.  Will get help right away if you are not doing well or get worse. Document Released: 05/06/2005 Document Revised: 09/20/2013 Document Reviewed: 10/26/2012 [facility] Patient Information 2015 [place], [place]. This information is not intended to replace advice given to you by your health care provider. Make sure you discuss any questions you have with your health care provider.

## 2014-07-09 NOTE — ED Provider Notes (Signed)
CSN: [id number]     Arrival date & time 07/09/14  1344 History   First MD Initiated Contact with Patient 07/09/14 1543     Chief Complaint  Patient presents with  . Abdominal Pain     (Consider location/radiation/quality/duration/timing/severity/associated sxs/prior Treatment) HPI Marvie Herro is a 19 year old female who presents the ER complaining of missing her period for over a month, lower mild abdominal pain for the past several months, vaginal discharge and rash for 3 weeks, dysuria for the past 3 weeks, nausea without vomiting for the past 3 weeks and bilateral joint pain in her arms and legs for the past 3 weeks. Patient denies fever, vomiting, abdominal pain, chest pain, shortness of breath. Patient states she has one current sexual partner.   Past Medical History  Diagnosis Date  . Postpartum depression    Past Surgical History  Procedure Laterality Date  . Wisdom tooth extraction Bilateral 07/2013    x4   No family history on file. History  Substance Use Topics  . Smoking status: Never Smoker   . Smokeless tobacco: Never Used  . Alcohol Use: No     Comment: social   OB History    Gravida Para Term Preterm AB TAB SAB Ectopic Multiple Living   1 1 1  0      1     Review of Systems  Constitutional: Negative for fever.  HENT: Negative for trouble swallowing.   Eyes: Negative for visual disturbance.  Respiratory: Negative for shortness of breath.   Cardiovascular: Negative for chest pain.  Gastrointestinal: Positive for nausea and abdominal pain. Negative for vomiting and diarrhea.  Genitourinary: Positive for vaginal discharge. Negative for dysuria and vaginal bleeding.  Musculoskeletal: Negative for neck pain.  Skin: Negative for rash.  Neurological: Negative for dizziness, weakness and numbness.  Psychiatric/Behavioral: Negative.       Allergies  Review of patient's allergies indicates no known allergies.  Home Medications   Prior to Admission  medications   Medication Sig Start Date End Date Taking? Authorizing Provider  fluconazole (DIFLUCAN) 100 MG tablet Take 1 tablet daily for 3 days. Patient not taking: Reported on 07/09/2014 05/16/14   Wilmer Floor Leftwich-Kirby, CNM  nitrofurantoin, macrocrystal-monohydrate, (MACROBID) 100 MG capsule Take 1 capsule (100 mg total) by mouth 2 (two) times daily. X 5 days 07/09/14   Monte Fantasia, PA-C   BP 117/87 mmHg  Pulse 80  Temp(Src) 98.4 F (36.9 C) (Oral)  Resp 18  SpO2 99%  LMP 05/19/2014 Physical Exam  Constitutional: She is oriented to person, place, and time. She appears well-developed and well-nourished. No distress.  HENT:  Head: Normocephalic and atraumatic.  Mouth/Throat: Oropharynx is clear and moist. No oropharyngeal exudate.  Eyes: Right eye exhibits no discharge. Left eye exhibits no discharge. No scleral icterus.  Neck: Normal range of motion.  Cardiovascular: Normal rate, regular rhythm and normal heart sounds.   No murmur heard. Pulmonary/Chest: Effort normal and breath sounds normal. No respiratory distress.  Abdominal: Soft. There is tenderness in the suprapubic area. There is no rigidity, no rebound, no guarding, no tenderness at McBurney's point and negative Murphy's sign.  Mild, diffuse suprapubic tenderness.  Genitourinary: Pelvic exam was performed with patient supine. No labial fusion. There is rash on the right labia. There is no tenderness, lesion or injury on the right labia. There is rash on the left labia. There is no tenderness, lesion or injury on the left labia. Cervix exhibits no motion tenderness and no friability. Right  adnexum displays no mass, no tenderness and no fullness. Left adnexum displays tenderness. Left adnexum displays no mass and no fullness. No erythema, tenderness or bleeding in the vagina. No foreign body around the vagina. No signs of injury around the vagina. Vaginal discharge found.  No cervical motion tenderness. Mild left adnexal  tenderness noted. Moderate amount of green colored discharge from cervical os. Chaperone present during entire pelvic exam.  Musculoskeletal: Normal range of motion. She exhibits no edema or tenderness.  Neurological: She is alert and oriented to person, place, and time. She has normal strength. No cranial nerve deficit or sensory deficit. Gait abnormal. Coordination normal. GCS eye subscore is 4. GCS verbal subscore is 5. GCS motor subscore is 6.  Skin: Skin is warm and dry. No rash noted. She is not diaphoretic.  Psychiatric: She has a normal mood and affect.    ED Course  Procedures (including critical care time) Labs Review Labs Reviewed  WET PREP, GENITAL - Abnormal; Notable for the following:    WBC, Wet Prep HPF POC MANY (*)    All other components within normal limits  COMPREHENSIVE METABOLIC PANEL - Abnormal; Notable for the following:    Sodium 134 (*)    Glucose, Bld 104 (*)    BUN <5 (*)    Alkaline Phosphatase 34 (*)    All other components within normal limits  URINALYSIS, ROUTINE W REFLEX MICROSCOPIC - Abnormal; Notable for the following:    APPearance CLOUDY (*)    Leukocytes, UA LARGE (*)    All other components within normal limits  URINE MICROSCOPIC-ADD ON - Abnormal; Notable for the following:    Squamous Epithelial / LPF MANY (*)    Bacteria, UA MANY (*)    All other components within normal limits  HCG, QUANTITATIVE, PREGNANCY - Abnormal; Notable for the following:    hCG, Beta Chain, Quant, S [address]109 (*)    All other components within normal limits  POC URINE PREG, ED - Abnormal; Notable for the following:    Preg Test, Ur POSITIVE (*)    All other components within normal limits  I-STAT BETA HCG BLOOD, ED ([facility], [facility], [facility] ONLY) - Abnormal; Notable for the following:    I-stat hCG, quantitative >2000.0 (*)    All other components within normal limits  URINE CULTURE  CBC WITH DIFFERENTIAL/PLATELET  LIPASE, BLOOD  HIV ANTIBODY (ROUTINE TESTING)  RPR   GC/CHLAMYDIA PROBE AMP ([facility])    Imaging Review US Ob Comp Less 14 Wks  07/09/2014   CLINICAL DATA:  First trimester pregnancy with pelvic pain  EXAM: OBSTETRIC <14 WK [place] AND TRANSVAGINAL OB  US DOPPLER ULTRASOUND OF OVARIES  TECHNIQUE: Both transabdominal and transvaginal ultrasound examinations were performed for complete evaluation of the gestation as well as the maternal uterus, adnexal regions, and pelvic cul-de-sac. Transvaginal technique was performed to assess early pregnancy.  Color and duplex Doppler ultrasound was utilized to evaluate blood flow to the ovaries.  COMPARISON:  None.  FINDINGS: Intrauterine gestational sac: Visualized/normal in shape.  Yolk sac:  Present  Embryo:  Present  Cardiac Activity: Present  Heart Rate: 155 bpm  CRL:   12  mm   7 w 3 d                  [place] EDC: 02/22/2015  Maternal uterus/adnexae: No subchorionic hemorrhage. Ovaries are normal in show normal blood flow. Trace free fluid in the pelvis.  Pulsed Doppler evaluation of both ovaries demonstrates  normal appearing low-resistance arterial and venous waveforms.  IMPRESSION: Normal study demonstrating live intrauterine gestation   Electronically Signed   By: Esperanza Heir M.D.   On: 07/09/2014 19:48   US Ob Transvaginal  07/09/2014   CLINICAL DATA:  First trimester pregnancy with pelvic pain  EXAM: OBSTETRIC <14 WK [place] AND TRANSVAGINAL OB  US DOPPLER ULTRASOUND OF OVARIES  TECHNIQUE: Both transabdominal and transvaginal ultrasound examinations were performed for complete evaluation of the gestation as well as the maternal uterus, adnexal regions, and pelvic cul-de-sac. Transvaginal technique was performed to assess early pregnancy.  Color and duplex Doppler ultrasound was utilized to evaluate blood flow to the ovaries.  COMPARISON:  None.  FINDINGS: Intrauterine gestational sac: Visualized/normal in shape.  Yolk sac:  Present  Embryo:  Present  Cardiac Activity: Present  Heart Rate: 155 bpm  CRL:   12  mm   7 w  3 d                  [place] EDC: 02/22/2015  Maternal uterus/adnexae: No subchorionic hemorrhage. Ovaries are normal in show normal blood flow. Trace free fluid in the pelvis.  Pulsed Doppler evaluation of both ovaries demonstrates normal appearing low-resistance arterial and venous waveforms.  IMPRESSION: Normal study demonstrating live intrauterine gestation   Electronically Signed   By: Esperanza Heir M.D.   On: 07/09/2014 19:48   [place] Art/ven Flow Abd Pelv Doppler  07/09/2014   CLINICAL DATA:  First trimester pregnancy with pelvic pain  EXAM: OBSTETRIC <14 WK [place] AND TRANSVAGINAL OB  US DOPPLER ULTRASOUND OF OVARIES  TECHNIQUE: Both transabdominal and transvaginal ultrasound examinations were performed for complete evaluation of the gestation as well as the maternal uterus, adnexal regions, and pelvic cul-de-sac. Transvaginal technique was performed to assess early pregnancy.  Color and duplex Doppler ultrasound was utilized to evaluate blood flow to the ovaries.  COMPARISON:  None.  FINDINGS: Intrauterine gestational sac: Visualized/normal in shape.  Yolk sac:  Present  Embryo:  Present  Cardiac Activity: Present  Heart Rate: 155 bpm  CRL:   12  mm   7 w 3 d                  [place] EDC: 02/22/2015  Maternal uterus/adnexae: No subchorionic hemorrhage. Ovaries are normal in show normal blood flow. Trace free fluid in the pelvis.  Pulsed Doppler evaluation of both ovaries demonstrates normal appearing low-resistance arterial and venous waveforms.  IMPRESSION: Normal study demonstrating live intrauterine gestation   Electronically Signed   By: Esperanza Heir M.D.   On: 07/09/2014 19:48     EKG Interpretation None      MDM   Final diagnoses:  Left adnexal tenderness  Intrauterine pregnancy  Cervicitis  Asymptomatic bacteriuria    Patient here for diffuse lower mild abdominal pain for the past month along with some generalized constitutional symptoms over the past month and stating she has missed her  period last month. Patient noted to be pregnant on urine pregnancy. Pelvic exam remarkable for cervicitis--Will treat patient empirically and send culture for GC Chlamydia based on pelvic exam, leukocytes on wet prep and urine, bacteria in urine. Based on patient's mild lower abdominal pain and mild adnexal tenderness, patient is followed up with ultrasound OB less than 14 weeks which had an impression of a normal study demonstrating live IUP with no ovarian torsion.  Patient's constitutional symptoms thought to be due to her first trimester pregnancy which she was noted to be  7 weeks and 3 days. This correlated well with her beta hCG. Patient's symptoms treated in the ER, throughout her stay patient is afebrile, non-tachycardic, nontachypneic, normotensive, non-hypoxic, well-appearing and in no acute distress. Patient does have nonspecific rash noted on GU exam. No concern for genital herpes. Patient treated for asymptomatic bacteriuria, discharged and strongly encouraged to follow-up with OB/GYN. I discussed return precautions with patient, patient verbalizes understanding and agreement of this plan. I encouraged patient to call or return to the ER should she have any questions or concerns.  BP 117/87 mmHg  Pulse 80  Temp(Src) 98.4 F (36.9 C) (Oral)  Resp 18  SpO2 99%  LMP 05/19/2014  Signed,  Ladona MowJoe Torah Pinnock, PA-C 1:34 AM  Patient discussed with Dr. Mirian MoMatthew Gentry, MD   Monte FantasiaJoseph W Nitzia Perren, PA-C 07/10/14 0134  Mirian MoMatthew Gentry, MD 07/10/14 [phone]

## 2014-07-09 NOTE — ED Notes (Signed)
Ultrasound at bedside

## 2014-07-10 LAB — RPR: RPR Ser Ql: NONREACTIVE

## 2014-07-11 LAB — GC/CHLAMYDIA PROBE AMP (~~LOC~~) NOT AT ARMC
Chlamydia: NEGATIVE
NEISSERIA GONORRHEA: NEGATIVE

## 2014-07-11 LAB — HIV ANTIBODY (ROUTINE TESTING W REFLEX): HIV Screen 4th Generation wRfx: NONREACTIVE

## 2014-07-11 LAB — URINE CULTURE: Colony Count: 70000

## 2014-08-24 ENCOUNTER — Emergency Department (HOSPITAL_COMMUNITY)
Admission: EM | Admit: 2014-08-24 | Discharge: 2014-08-24 | Disposition: A | Discharge status: Home / Self Care | Payer: Medicaid Other | Attending: Emergency Medicine | Admitting: Emergency Medicine

## 2014-08-24 ENCOUNTER — Encounter (HOSPITAL_COMMUNITY): Payer: Self-pay

## 2014-08-24 DIAGNOSIS — O9989 Other specified diseases and conditions complicating pregnancy, childbirth and the puerperium: Secondary | ICD-10-CM | POA: Diagnosis not present

## 2014-08-24 DIAGNOSIS — Z8659 Personal history of other mental and behavioral disorders: Secondary | ICD-10-CM | POA: Insufficient documentation

## 2014-08-24 DIAGNOSIS — Z3A12 12 weeks gestation of pregnancy: Secondary | ICD-10-CM | POA: Diagnosis not present

## 2014-08-24 DIAGNOSIS — N898 Other specified noninflammatory disorders of vagina: Secondary | ICD-10-CM

## 2014-08-24 DIAGNOSIS — Z79899 Other long term (current) drug therapy: Secondary | ICD-10-CM | POA: Diagnosis not present

## 2014-08-24 DIAGNOSIS — O26891 Other specified pregnancy related conditions, first trimester: Secondary | ICD-10-CM

## 2014-08-24 LAB — URINALYSIS, ROUTINE W REFLEX MICROSCOPIC
Bilirubin Urine: NEGATIVE
Glucose, UA: NEGATIVE mg/dL
Hgb urine dipstick: NEGATIVE
Ketones, ur: NEGATIVE mg/dL
Nitrite: NEGATIVE
Protein, ur: NEGATIVE mg/dL
Specific Gravity, Urine: 1.023 (ref 1.005–1.030)
Urobilinogen, UA: 0.2 mg/dL (ref 0.0–1.0)
pH: 6.5 (ref 5.0–8.0)

## 2014-08-24 LAB — URINE MICROSCOPIC-ADD ON

## 2014-08-24 LAB — WET PREP, GENITAL: Trich, Wet Prep: NONE SEEN

## 2014-08-24 LAB — PREGNANCY, URINE: Preg Test, Ur: POSITIVE — AB

## 2014-08-24 MED ORDER — LIDOCAINE HCL (PF) 1 % IJ SOLN
INTRAMUSCULAR | Status: AC
  Administered 2014-08-24: 1.2 mL
  Filled 2014-08-24: qty 5

## 2014-08-24 MED ORDER — CEFTRIAXONE SODIUM 250 MG IJ SOLR
250.0000 mg | Freq: Once | INTRAMUSCULAR | Status: AC
  Administered 2014-08-24: 250 mg via INTRAMUSCULAR
  Filled 2014-08-24: qty 250

## 2014-08-24 MED ORDER — AZITHROMYCIN 250 MG PO TABS
1000.0000 mg | ORAL_TABLET | Freq: Once | ORAL | Status: AC
  Administered 2014-08-24: 1000 mg via ORAL
  Filled 2014-08-24: qty 4

## 2014-08-24 NOTE — Discharge Instructions (Signed)
Return here as needed.  Follow-up with the clinic provided.  °

## 2014-08-24 NOTE — ED Provider Notes (Signed)
CSN: [id number]     Arrival date & time 08/24/14  1500 History   First MD Initiated Contact with Patient 08/24/14 1633     Chief Complaint  Patient presents with  . Pelvic Pain  . Anal Itching  . Vaginal Itching     (Consider location/radiation/quality/duration/timing/severity/associated sxs/prior Treatment) HPI  [name] is a 19 yo female G2P1 who presents to the ED with anal and vaginal itching and a white/brown discharge and positional, sharp, intermittent pelvic pain. She says the itching has been going on for a couple months, but two days ago she though she noticed worms in her stool. She says her son at home also has complained of anal itching. She has tried cortisone cream for the anal itching and it has provided some relief. She endorses history of hemorrhoids. She says she has noticed a white discharge for several days, sometimes brown when she wipes.The pelvic pain occurs mostly when she stands or changes position and is bilateral. She says it is sharp but not severe. She has tried OTC cream for the vaginal itching that she has used in the past with yeast infections. She endorses nausea and constipation. Denies fever, chills, diarrhea, headache, weakness, syncope, chest pain, shortness of breath or vaginal bleeding with this pregnancy. She thinks she is about 12 weeks but has not been to an OB yet.   She denies sexual activity since finding out she was pregnant Feb 20. She says she has been tested for STIs since that time. She endorses history of chlamydia infection within the last 6 months and said she did not finish her medication or return for follow up for that infection.   Past Medical History  Diagnosis Date  . Postpartum depression    Past Surgical History  Procedure Laterality Date  . Wisdom tooth extraction Bilateral 07/2013    x4   Family History  Problem Relation Age of Onset  . Asthma Mother    History  Substance Use Topics  . Smoking status: Never Smoker   .  Smokeless tobacco: Never Used  . Alcohol Use: No     Comment: social   OB History    Gravida Para Term Preterm AB TAB SAB Ectopic Multiple Living   2 1 1  0      1     Review of Systems All other systems negative except as documented in the HPI. All pertinent positives and negatives as reviewed in the HPI.    Allergies  Review of patient's allergies indicates no known allergies.  Home Medications   Prior to Admission medications   Medication Sig Start Date End Date Taking? Authorizing Provider  Prenatal Vit-Fe Fumarate-FA (MULTIVITAMIN-PRENATAL) 27-0.8 MG TABS tablet Take 1 tablet by mouth daily at 12 noon.   Yes Historical Provider, MD  fluconazole (DIFLUCAN) 100 MG tablet Take 1 tablet daily for 3 days. Patient not taking: Reported on 07/09/2014 05/16/14   Wilmer FloorLisa A Leftwich-Kirby, CNM  nitrofurantoin, macrocrystal-monohydrate, (MACROBID) 100 MG capsule Take 1 capsule (100 mg total) by mouth 2 (two) times daily. X 5 days Patient not taking: Reported on 08/24/2014 07/09/14   Ladona MowJoe Mintz, PA-C   BP 127/66 mmHg  Pulse 87  Temp(Src) 98.1 F (36.7 C) (Oral)  Resp 18  SpO2 100%  LMP 05/19/2014 Physical Exam  Constitutional: She is oriented to person, place, and time. She appears well-developed and well-nourished.  HENT:  Head: Normocephalic and atraumatic.  Mouth/Throat: Oropharynx is clear and moist.  Eyes: Pupils are equal, round,  and reactive to light.  Neck: Normal range of motion. Neck supple. No thyromegaly present.  Cardiovascular: Normal rate, regular rhythm and normal heart sounds.  Exam reveals no gallop and no friction rub.   No murmur heard. Pulmonary/Chest: Effort normal and breath sounds normal. No respiratory distress. She has no wheezes.  Abdominal: Soft. Bowel sounds are normal. There is no tenderness.  Gravid.  Genitourinary: Rectum normal. Rectal exam shows no external hemorrhoid. There is rash on the right labia. There is rash on the left labia. Uterus is enlarged.  Cervix exhibits no motion tenderness. Right adnexum displays no tenderness. Left adnexum displays no tenderness. No erythema or bleeding in the vagina. Vaginal discharge found.  Neurological: She is alert and oriented to person, place, and time. She exhibits normal muscle tone. Coordination normal.  Skin: Skin is warm and dry. No rash noted. No erythema.  Nursing note and vitals reviewed.   ED Course  Procedures (including critical care time) Labs Review Labs Reviewed  WET PREP, GENITAL - Abnormal; Notable for the following:    Yeast Wet Prep HPF POC FEW (*)    Clue Cells Wet Prep HPF POC FEW (*)    WBC, Wet Prep HPF POC MODERATE (*)    All other components within normal limits  URINALYSIS, ROUTINE W REFLEX MICROSCOPIC - Abnormal; Notable for the following:    APPearance CLOUDY (*)    Leukocytes, UA LARGE (*)    All other components within normal limits  URINE MICROSCOPIC-ADD ON - Abnormal; Notable for the following:    Squamous Epithelial / LPF MANY (*)    Bacteria, UA MANY (*)    All other components within normal limits  PREGNANCY, URINE - Abnormal; Notable for the following:    Preg Test, Ur POSITIVE (*)    All other components within normal limits  URINE CULTURE  RPR  GC/CHLAMYDIA PROBE AMP ([place])     Patient will be treated for possible STD.  [facility].  Told to return here as needed.  The patient is not having any current abdominal pain.  Charlestine Night, PA-C 08/25/14 0109  Elwin Mocha, MD 08/31/14 [phone]

## 2014-08-24 NOTE — ED Notes (Addendum)
Patient states she has been having anal itching and states she was seen for it before and was told she had hemorrhoids. Patient states she has increased itching at night and saw white worms in her stool. Patient also reports tht she saw a small amount of blood on the toilet paper after wiping. Patient also c/o vaginal itching and white/tan vaginal discharge. Pt. States she is approx [redacted] weeks pregnant. Patient alsc/o bilateral lower abdominal pain.

## 2014-08-25 LAB — GC/CHLAMYDIA PROBE AMP (~~LOC~~) NOT AT ARMC
Chlamydia: NEGATIVE
Neisseria Gonorrhea: NEGATIVE

## 2014-08-26 LAB — RPR: RPR Ser Ql: NONREACTIVE

## 2014-08-26 LAB — URINE CULTURE: Colony Count: >=100000

## 2014-09-05 ENCOUNTER — Other Ambulatory Visit (HOSPITAL_COMMUNITY): Payer: Self-pay | Admitting: Urology

## 2014-09-05 DIAGNOSIS — Z3689 Encounter for other specified antenatal screening: Secondary | ICD-10-CM

## 2014-09-05 LAB — OB RESULTS CONSOLE RUBELLA ANTIBODY, IGM: RUBELLA: IMMUNE

## 2014-09-05 LAB — OB RESULTS CONSOLE ANTIBODY SCREEN: Antibody Screen: NEGATIVE

## 2014-09-05 LAB — OB RESULTS CONSOLE GC/CHLAMYDIA
CHLAMYDIA, DNA PROBE: NEGATIVE
Gonorrhea: NEGATIVE

## 2014-09-05 LAB — OB RESULTS CONSOLE ABO/RH: RH Type: POSITIVE

## 2014-09-05 LAB — OB RESULTS CONSOLE HIV ANTIBODY (ROUTINE TESTING): HIV: NONREACTIVE

## 2014-09-05 LAB — OB RESULTS CONSOLE HEPATITIS B SURFACE ANTIGEN: Hepatitis B Surface Ag: NEGATIVE

## 2014-09-05 LAB — OB RESULTS CONSOLE RPR: RPR: NONREACTIVE

## 2014-09-20 ENCOUNTER — Inpatient Hospital Stay (HOSPITAL_COMMUNITY)
Admission: AD | Admit: 2014-09-20 | Discharge: 2014-09-20 | Disposition: A | Discharge status: Home / Self Care | Payer: Medicaid Other | Source: Ambulatory Visit | Attending: Family Medicine | Admitting: Family Medicine

## 2014-09-20 ENCOUNTER — Encounter (HOSPITAL_COMMUNITY): Payer: Self-pay | Admitting: *Deleted

## 2014-09-20 DIAGNOSIS — O23592 Infection of other part of genital tract in pregnancy, second trimester: Secondary | ICD-10-CM | POA: Insufficient documentation

## 2014-09-20 DIAGNOSIS — B9689 Other specified bacterial agents as the cause of diseases classified elsewhere: Secondary | ICD-10-CM | POA: Insufficient documentation

## 2014-09-20 DIAGNOSIS — N898 Other specified noninflammatory disorders of vagina: Secondary | ICD-10-CM

## 2014-09-20 DIAGNOSIS — Z3A17 17 weeks gestation of pregnancy: Secondary | ICD-10-CM | POA: Diagnosis not present

## 2014-09-20 DIAGNOSIS — O368120 Decreased fetal movements, second trimester, not applicable or unspecified: Secondary | ICD-10-CM | POA: Diagnosis present

## 2014-09-20 DIAGNOSIS — N760 Acute vaginitis: Secondary | ICD-10-CM | POA: Diagnosis not present

## 2014-09-20 LAB — WET PREP, GENITAL
Trich, Wet Prep: NONE SEEN
YEAST WET PREP: NONE SEEN

## 2014-09-20 MED ORDER — METRONIDAZOLE 500 MG PO TABS: 500.0000 mg | ORAL_TABLET | Freq: Two times a day (BID) | ORAL | Status: DC

## 2014-09-20 NOTE — MAU Note (Signed)
Urine in lab 

## 2014-09-20 NOTE — MAU Note (Signed)
Had felt movement last wk, now not feeling it.  Been having headaches when she wake up, hasn't taken anything, doesn't really like to take meds, 'just fight it off herself'.

## 2014-09-20 NOTE — MAU Note (Signed)
Pt says she has burning in her vagina.

## 2014-09-20 NOTE — MAU Provider Note (Signed)
History     CSN: [id number]  Arrival date and time: 09/20/14 1759   First Provider Initiated Contact with Patient 09/20/14 1902      Chief Complaint  Patient presents with  . Headache  . Decreased Fetal Movement   HPI  [name] is a 19 y.o. G2P1001 at [redacted]w[redacted]d here with report of intermittent headaches since becoming pregnant.  Headaches are present upon waking.  Also concerned because she has not felt baby move today.  [facility]  No report of problems during the pregnancy.  Pt denies headache at this time.  Primary concern is burning on vaginal area and vaginal lesions off an on since birth of last child.  Reports boyfriend told her he was exposed to herpes in a past relationship.    Past Medical History  Diagnosis Date  . Postpartum depression     Past Surgical History  Procedure Laterality Date  . Wisdom tooth extraction Bilateral 07/2013    x4    Family History  Problem Relation Age of Onset  . Asthma Mother     History  Substance Use Topics  . Smoking status: Never Smoker   . Smokeless tobacco: Never Used  . Alcohol Use: No     Comment: social    Allergies: No Known Allergies  Prescriptions prior to admission  Medication Sig Dispense Refill Last Dose  . Prenatal Vit-Fe Fumarate-FA (PRENATAL MULTIVITAMIN) TABS tablet Take 1 tablet by mouth daily at 12 noon.   Past Week at Unknown time  . fluconazole (DIFLUCAN) 100 MG tablet Take 1 tablet daily for 3 days. (Patient not taking: Reported on 07/09/2014) 3 tablet 0 Completed Course at Unknown time  . nitrofurantoin, macrocrystal-monohydrate, (MACROBID) 100 MG capsule Take 1 capsule (100 mg total) by mouth 2 (two) times daily. X 5 days (Patient not taking: Reported on 08/24/2014) 10 capsule 0 Not Taking at Unknown time    Review of Systems  Constitutional: Negative for fever and chills.  Gastrointestinal: Negative for nausea and vomiting.  Genitourinary: Negative for  dysuria and urgency.       Vaginal burning  Neurological: Positive for headaches.  All other systems reviewed and are negative.  Physical Exam   Blood pressure 117/76, pulse 80, temperature 98.6 F (37 C), temperature source Oral, resp. rate 18, height 5' 0.5" (1.537 m), weight 90.719 kg (200 lb), last menstrual period 05/19/2014.  Physical Exam  Constitutional: She is oriented to person, place, and time. She appears well-developed and well-nourished.  HENT:  Head: Normocephalic.  Eyes: EOM are normal. Pupils are equal, round, and reactive to light.  Neck: Normal range of motion. Neck supple.  Cardiovascular: Normal rate, regular rhythm and normal heart sounds.   Respiratory: Effort normal and breath sounds normal.  Genitourinary:    There is lesion on the right labia. There is no lesion on the left labia. No bleeding in the vagina. No vaginal discharge found.  Neurological: She is alert and oriented to person, place, and time.  Skin: Skin is warm and dry.   Doppler FHR 136 MAU Course  Procedures  Results for orders placed or performed during the hospital encounter of 09/20/14 (from the past 24 hour(s))  Wet prep, genital     Status: Abnormal   Collection Time: 09/20/14  7:10 PM  Result Value Ref Range   Yeast Wet Prep HPF POC NONE SEEN NONE SEEN   Trich, Wet Prep NONE SEEN NONE SEEN   Clue  Cells Wet Prep HPF POC FEW (A) NONE SEEN   WBC, Wet Prep HPF POC FEW (A) NONE SEEN     Assessment and Plan  19 y.o. G2P1001 at 5724w5d IUP Vaginal Lesions Bacterial Vaginosis  Plan:  Discharge to home RX Flagyl 500 BID x 7 days HSV culture pending GC/CT pending HIV/RPR pending  Marlis EdelsonWalidah N Karim, CNM

## 2014-09-21 LAB — HIV ANTIBODY (ROUTINE TESTING W REFLEX): HIV Screen 4th Generation wRfx: NONREACTIVE

## 2014-09-21 LAB — GC/CHLAMYDIA PROBE AMP (~~LOC~~) NOT AT ARMC
Chlamydia: NEGATIVE
Neisseria Gonorrhea: NEGATIVE

## 2014-09-21 LAB — RPR: RPR Ser Ql: NONREACTIVE

## 2014-09-21 LAB — HSV(HERPES SMPLX)ABS-I+II(IGG+IGM)-BLD
HSV 1 Glycoprotein G Ab, IgG: <0.91 index (ref 0.00–0.90)
HSV 2 Glycoprotein G Ab, IgG: <0.91 index (ref 0.00–0.90)
HSVI/II Comb IgM: <0.91 Ratio (ref 0.00–0.90)

## 2014-09-22 LAB — HERPES SIMPLEX VIRUS CULTURE: Culture: NOT DETECTED

## 2014-09-28 ENCOUNTER — Ambulatory Visit (HOSPITAL_COMMUNITY)
Admission: RE | Admit: 2014-09-28 | Discharge: 2014-09-28 | Disposition: A | Discharge status: Home / Self Care | Payer: Medicaid Other | Source: Ambulatory Visit | Attending: Urology | Admitting: Urology

## 2014-09-28 ENCOUNTER — Other Ambulatory Visit (HOSPITAL_COMMUNITY): Payer: Self-pay | Admitting: Urology

## 2014-09-28 DIAGNOSIS — Z3689 Encounter for other specified antenatal screening: Secondary | ICD-10-CM | POA: Insufficient documentation

## 2014-09-28 DIAGNOSIS — Z36 Encounter for antenatal screening of mother: Secondary | ICD-10-CM | POA: Insufficient documentation

## 2014-09-28 DIAGNOSIS — O99212 Obesity complicating pregnancy, second trimester: Secondary | ICD-10-CM | POA: Insufficient documentation

## 2014-09-28 DIAGNOSIS — O99213 Obesity complicating pregnancy, third trimester: Secondary | ICD-10-CM | POA: Insufficient documentation

## 2014-09-28 DIAGNOSIS — Z3A18 18 weeks gestation of pregnancy: Secondary | ICD-10-CM | POA: Insufficient documentation

## 2014-10-31 ENCOUNTER — Encounter (HOSPITAL_COMMUNITY): Payer: Self-pay | Admitting: *Deleted

## 2014-10-31 ENCOUNTER — Inpatient Hospital Stay (HOSPITAL_COMMUNITY)
Admission: AD | Admit: 2014-10-31 | Discharge: 2014-10-31 | Disposition: A | Discharge status: Home / Self Care | Payer: Medicaid Other | Source: Ambulatory Visit | Attending: Obstetrics and Gynecology | Admitting: Obstetrics and Gynecology

## 2014-10-31 DIAGNOSIS — Z3A23 23 weeks gestation of pregnancy: Secondary | ICD-10-CM | POA: Diagnosis not present

## 2014-10-31 DIAGNOSIS — M549 Dorsalgia, unspecified: Secondary | ICD-10-CM | POA: Diagnosis not present

## 2014-10-31 DIAGNOSIS — R109 Unspecified abdominal pain: Secondary | ICD-10-CM | POA: Diagnosis present

## 2014-10-31 DIAGNOSIS — O9989 Other specified diseases and conditions complicating pregnancy, childbirth and the puerperium: Secondary | ICD-10-CM | POA: Insufficient documentation

## 2014-10-31 HISTORY — DX: Allergy status to unspecified drugs, medicaments and biological substances: Z88.9

## 2014-10-31 LAB — URINALYSIS, ROUTINE W REFLEX MICROSCOPIC
Bilirubin Urine: NEGATIVE
Glucose, UA: NEGATIVE mg/dL
Hgb urine dipstick: NEGATIVE
KETONES UR: 40 mg/dL — AB
Nitrite: NEGATIVE
PH: 6 (ref 5.0–8.0)
PROTEIN: 30 mg/dL — AB
Specific Gravity, Urine: 1.025 (ref 1.005–1.030)
UROBILINOGEN UA: 0.2 mg/dL (ref 0.0–1.0)

## 2014-10-31 LAB — URINE MICROSCOPIC-ADD ON

## 2014-10-31 NOTE — MAU Note (Signed)
C/o lower abdominal and lower back pain since she started her job;

## 2014-10-31 NOTE — MAU Provider Note (Signed)
  History   G2P1001 in with c/o of back and abd pain when she is at work. 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 This has been going on for a while now she states.  CSN: [id number]  Arrival date and time: 10/31/14 1507   None     Chief Complaint  Patient presents with  . Abdominal Pain  . Back Pain   HPI  OB History    Gravida Para Term Preterm AB TAB SAB Ectopic Multiple Living   2 1 1  0      1      Past Medical History  Diagnosis Date  . Postpartum depression     Past Surgical History  Procedure Laterality Date  . Wisdom tooth extraction Bilateral 07/2013    x4    Family History  Problem Relation Age of Onset  . Asthma Mother     History  Substance Use Topics  . Smoking status: Never Smoker   . Smokeless tobacco: Never Used  . Alcohol Use: No     Comment: social    Allergies: No Known Allergies  Prescriptions prior to admission  Medication Sig Dispense Refill Last Dose  . metroNIDAZOLE (FLAGYL) 500 MG tablet Take 1 tablet (500 mg total) by mouth 2 (two) times daily. 14 tablet 0   . Prenatal Vit-Fe Fumarate-FA (PRENATAL MULTIVITAMIN) TABS tablet Take 1 tablet by mouth daily at 12 noon.   Past Week at Unknown time    Review of Systems  Constitutional: Negative.   HENT: Negative.   Eyes: Negative.   Respiratory: Negative.   Cardiovascular: Negative.   Gastrointestinal: Positive for abdominal pain.  Genitourinary: Negative.   Musculoskeletal: Positive for back pain.  Skin: Negative.   Neurological: Negative.   Endo/Heme/Allergies: Negative.   Psychiatric/Behavioral: Negative.    Physical Exam   Last menstrual period 05/19/2014.  Physical Exam  Constitutional: She is oriented to person, place, and time. She appears well-developed and well-nourished.  HENT:  Head: Normocephalic.  Eyes: Pupils are equal, round, and reactive to light.  Neck: Normal range of motion.  Cardiovascular: Normal rate, regular rhythm, normal heart  sounds and intact distal pulses.   Respiratory: Effort normal and breath sounds normal.  GI: Soft. Bowel sounds are normal.  Genitourinary: Vagina normal and uterus normal.  Musculoskeletal: Normal range of motion.  Neurological: She is alert and oriented to person, place, and time. She has normal reflexes.  Skin: Skin is warm and dry.  Psychiatric: She has a normal mood and affect. Her behavior is normal. Thought content normal.    MAU Course  Procedures  MDM Back pain in pregnancy  Assessment and Plan  Back ain in pregancy, totalk with primary provider and use prenatal cradle, tylenol for discomfort.  Novie Maggio DARLENE 10/31/2014, 3:30 PM

## 2014-10-31 NOTE — Discharge Instructions (Signed)

## 2015-01-26 LAB — OB RESULTS CONSOLE GC/CHLAMYDIA
Chlamydia: NEGATIVE
GC PROBE AMP, GENITAL: NEGATIVE

## 2015-01-26 LAB — OB RESULTS CONSOLE GBS: GBS: POSITIVE

## 2015-02-08 ENCOUNTER — Inpatient Hospital Stay (HOSPITAL_COMMUNITY)
Admission: AD | Admit: 2015-02-08 | Discharge: 2015-02-08 | Disposition: A | Discharge status: Home / Self Care | Payer: Medicaid Other | Source: Ambulatory Visit | Attending: Family Medicine | Admitting: Family Medicine

## 2015-02-08 ENCOUNTER — Encounter (HOSPITAL_COMMUNITY): Payer: Self-pay | Admitting: *Deleted

## 2015-02-08 DIAGNOSIS — N760 Acute vaginitis: Secondary | ICD-10-CM

## 2015-02-08 DIAGNOSIS — L293 Anogenital pruritus, unspecified: Secondary | ICD-10-CM | POA: Diagnosis present

## 2015-02-08 DIAGNOSIS — Z3A37 37 weeks gestation of pregnancy: Secondary | ICD-10-CM | POA: Insufficient documentation

## 2015-02-08 DIAGNOSIS — O23592 Infection of other part of genital tract in pregnancy, second trimester: Secondary | ICD-10-CM | POA: Diagnosis not present

## 2015-02-08 DIAGNOSIS — O26893 Other specified pregnancy related conditions, third trimester: Secondary | ICD-10-CM | POA: Insufficient documentation

## 2015-02-08 LAB — WET PREP, GENITAL
Clue Cells Wet Prep HPF POC: NONE SEEN
TRICH WET PREP: NONE SEEN
YEAST WET PREP: NONE SEEN

## 2015-02-08 MED ORDER — VALACYCLOVIR HCL 500 MG PO TABS: 500.0000 mg | ORAL_TABLET | Freq: Two times a day (BID) | ORAL | Status: DC

## 2015-02-08 NOTE — MAU Note (Signed)
Pt states that she has been having ongoing cramping for a few days and is now having some brownish discharge. Lost her mucous plug and is having vaginal itching. Pt states that baby has been active. Denies LOF and bleeding.

## 2015-02-08 NOTE — MAU Provider Note (Signed)
History     CSN: [id number]  Arrival date and time: 02/08/15 1256   First Provider Initiated Contact with Patient 02/08/15 1354      CC: vaginal itching  HPI  Patient is 19 y.o. G2P1001 [redacted]w[redacted]d here with complaints of vaginal itching.  Ms. Fromm reports that for the past few days she has experienced vaginal itching. She thinks she may also have some vaginal discharge. She said she also believes she has a herpes outbreak. She reports that she was called by her regular OB provider and told that she does have herpes, but has not been taking Valtrex. She says that she can feel lesions and thinks she is having an outbreak currently. She does not want to take Valtrex because she is worried it will harm her baby.  She also reports that she was previously prescribed Zoloft after developing postpartum depression in her previous pregnancy, but has not been taking this either due to concerns that it might affect her baby. She states that she plans to resume the medication after her delivery.  +FM, denies LOF, VB, contractions.   Past Medical History  Diagnosis Date  . Postpartum depression   . Multiple allergies     Past Surgical History  Procedure Laterality Date  . Wisdom tooth extraction Bilateral 07/2013    x4    Family History  Problem Relation Age of Onset  . Asthma Mother   . Hypertension Mother   . Hypertension Maternal Aunt     Social History  Substance Use Topics  . Smoking status: Never Smoker   . Smokeless tobacco: Never Used  . Alcohol Use: No     Comment: social    Allergies:  Allergies  Allergen Reactions  . Macrobid [Nitrofurantoin Monohyd Macro] Nausea And Vomiting    No prescriptions prior to admission    Review of Systems  Cardiovascular: Positive for leg swelling.  Gastrointestinal: Positive for heartburn, nausea, vomiting, abdominal pain and diarrhea. Negative for constipation.  Genitourinary: Positive for dysuria and frequency. Negative for urgency  and hematuria.  Skin: Positive for rash (thinks she has genital lesions).   Physical Exam   Blood pressure 118/72, pulse 88, temperature 98.1 F (36.7 C), temperature source Oral, resp. rate 18, height  (1.549 m), weight 217 lb (98.431 kg), last menstrual period 05/19/2014.  Physical Exam  Nursing note and vitals reviewed. Constitutional: She is oriented to person, place, and time. She appears well-developed and well-nourished. No distress.  HENT:  Head: Normocephalic and atraumatic.  Cardiovascular: Normal rate.   Respiratory: Effort normal. No respiratory distress.  GI: Soft. She exhibits no distension.  Gravid  Genitourinary: No vaginal discharge found.  Hypopigmented (whitish in appearance) thickened but not hyperkeratinized skin at base of vulva and rectum bilaterally. Non tender. No vesicles or ulcerations noted.   Musculoskeletal: She exhibits no edema or tenderness.  Neurological: She is alert and oriented to person, place, and time. No cranial nerve deficit. Coordination normal.  Skin: Skin is warm and dry.  Psychiatric: She has a normal mood and affect. Her behavior is normal.    MAU Course  Procedures None  MDM Speculum exam - no lesions suggestive of herpes noted NST: reactive Toco quiet Assessment and Plan  A: Patient is 19 y.o. G2P1001 [redacted]w[redacted]d reporting vaginal itching.   P: Discharge home - Reviewed findings and my conclusion - Handout given - Follow-up with OB provider - Discussed that Valtrex is safe during pregnancy and the importance of taking this as prescribed -  Explained importance of taking Zoloft as prescribed to prevent postpartum depression - Consider investigating skin changes further at office visit (punch biopsy vs skin scraping)  Federico Flake, MD PGY-1 [facility] 02/08/2015, 8:53 PM   OB fellow attestation:  I have seen and examined this patient; I agree with above documentation in the resident's note.    DALEIZA BACCHI is a 19 y.o. 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  PE: BP 118/72 mmHg  Pulse 88  Temp(Src) 98.1 F (36.7 C) (Oral)  Resp 18  Ht  (1.549 m)  Wt 217 lb (98.431 kg)  BMI 41.02 kg/m2  LMP 05/19/2014 Gen: calm comfortable, NAD Resp: normal effort, no distress Abd: gravid  ROS, labs, PMH reviewed NST reactive   Plan: - fetal kick counts reinforced, preterm labor precautions - continue routine follow up in OB clinic - Counseled about importance of HSV prophy - Rx for valtrex and zoloft sent - Hypopigmented area is odd but not concerning and needs outpatient work up.   Federico Flake, MD 8:54 PM

## 2015-02-08 NOTE — MAU Note (Signed)
GBS swab not collected.

## 2015-02-08 NOTE — Discharge Instructions (Signed)
It is VERY important to take all your medications every day as prescribed. Please fill the prescription we are giving you today for Valtrex, and take that two times a day. Please begin taking the Zoloft that you have already been prescribed. Continue to take your prenatal multivitamin every day.    Premature Rupture and Preterm Premature Rupture of Membranes A sac made up of membranes surrounds your baby in the womb (uterus). When this sac breaks before contractions or labor starts, it is called premature rupture of membranes (PROM). Rupture of membranes is also known as your water breaking. If this happens before 37 weeks, it is called preterm premature rupture of membranes (PPROM). PPROM is serious. It needs medical care right away. CAUSES  PROM may be caused by the membranes getting weak. This happens at the end of pregnancy. PPROM is often due to an infection, but can be caused by a number of other things.  SIGNS OF PROM OR PPROM  A sudden gush of fluid from the vagina.  A slow leak of fluid from the vagina.  Your underwear stay wet. WHAT TO DO IF YOU THINK YOUR WATER BROKE Call your doctor right away. You will need to go to the hospital to get checked right away. WHAT HAPPENS IF YOU ARE TOLD YOU HAVE PROM OR PPROM? You will have tests done at the hospital. If you have PROM, you may be given medicine to start labor (induced). This may happen if you are not having contractions within 24 hours of your water breaking. If you have PPROM and are not having contractions, you may be given medicine to start labor. It will depend on how far along you are in your pregnancy. If you have PPROM, you:  And your baby will be watched closely for signs of infection or other problems.  May be given an antibiotic medicine. This can stop an infection from starting.  May be given a steroid medicine. This can help the lungs to develop faster.  May be given a medicine to stop early labor (preterm  labor).  May be told to stay in bed except to use the restroom (bed rest).  May be given medicine to start labor. This can happen if there are problems with you or the baby. Your treatment will depend on many factors. Document Released: 08/02/2008 Document Revised: 01/06/2013 Document Reviewed: 08/25/2012 [facility] Patient Information 2015 [place], [place]. This information is not intended to replace advice given to you by your health care provider. Make sure you discuss any questions you have with your health care provider. Postpartum Depression and Baby Blues The postpartum period begins right after the birth of a baby. During this time, there is often a great amount of joy and excitement. It is also a time of many changes in the life of the parents. Regardless of how many times a mother gives birth, each child brings new challenges and dynamics to the family. It is not unusual to have feelings of excitement along with confusing shifts in moods, emotions, and thoughts. All mothers are at risk of developing postpartum depression or the "baby blues." These mood changes can occur right after giving birth, or they may occur many months after giving birth. The baby blues or postpartum depression can be mild or severe. Additionally, postpartum depression can go away rather quickly, or it can be a long-term condition.  CAUSES Raised hormone levels and the rapid drop in those levels are thought to be a main cause of postpartum depression  and the baby blues. A number of hormones change during and after pregnancy. Estrogen and progesterone usually decrease right after the delivery of your baby. The levels of thyroid hormone and various cortisol steroids also rapidly drop. Other factors that play a role in these mood changes include major life events and genetics.  RISK FACTORS If you have any of the following risks for the baby blues or postpartum depression, know what symptoms to watch out for during the  postpartum period. Risk factors that may increase the likelihood of getting the baby blues or postpartum depression include:  Having a personal or family history of depression.   Having depression while being pregnant.   Having premenstrual mood issues or mood issues related to oral contraceptives.  Having a lot of life stress.   Having marital conflict.   Lacking a social support network.   Having a baby with special needs.   Having health problems, such as diabetes.  SIGNS AND SYMPTOMS Symptoms of baby blues include:  Brief changes in mood, such as going from extreme happiness to sadness.  Decreased concentration.   Difficulty sleeping.   Crying spells, tearfulness.   Irritability.   Anxiety.  Symptoms of postpartum depression typically begin within the first month after giving birth. These symptoms include:  Difficulty sleeping or excessive sleepiness.   Marked weight loss.   Agitation.   Feelings of worthlessness.   Lack of interest in activity or food.  Postpartum psychosis is a very serious condition and can be dangerous. Fortunately, it is rare. Displaying any of the following symptoms is cause for immediate medical attention. Symptoms of postpartum psychosis include:   Hallucinations and delusions.   Bizarre or disorganized behavior.   Confusion or disorientation.  DIAGNOSIS  A diagnosis is made by an evaluation of your symptoms. There are no medical or lab tests that lead to a diagnosis, but there are various questionnaires that a health care provider may use to identify those with the baby blues, postpartum depression, or psychosis. Often, a screening tool called the [place] Postnatal Depression Scale is used to diagnose depression in the postpartum period.  TREATMENT The baby blues usually goes away on its own in 1-2 weeks. Social support is often all that is needed. You will be encouraged to get adequate sleep and rest.  Occasionally, you may be given medicines to help you sleep.  Postpartum depression requires treatment because it can last several months or longer if it is not treated. Treatment may include individual or group therapy, medicine, or both to address any social, physiological, and psychological factors that may play a role in the depression. Regular exercise, a healthy diet, rest, and social support may also be strongly recommended.  Postpartum psychosis is more serious and needs treatment right away. Hospitalization is often needed. HOME CARE INSTRUCTIONS  Get as much rest as you can. Nap when the baby sleeps.   Exercise regularly. Some women find yoga and walking to be beneficial.   Eat a balanced and nourishing diet.   Do little things that you enjoy. Have a cup of tea, take a bubble bath, read your favorite magazine, or listen to your favorite music.  Avoid alcohol.   Ask for help with household chores, cooking, grocery shopping, or running errands as needed. Do not try to do everything.   Talk to people close to you about how you are feeling. Get support from your partner, family members, friends, or other new moms.  Try to stay positive in  how you think. Think about the things you are grateful for.   Do not spend a lot of time alone.   Only take over-the-counter or prescription medicine as directed by your health care provider.  Keep all your postpartum appointments.   Let your health care provider know if you have any concerns.  SEEK MEDICAL CARE IF: You are having a reaction to or problems with your medicine. SEEK IMMEDIATE MEDICAL CARE IF:  You have suicidal feelings.   You think you may harm the baby or someone else. MAKE SURE YOU:  Understand these instructions.  Will watch your condition.  Will get help right away if you are not doing well or get worse. Document Released: 02/08/2004 Document Revised: 05/11/2013 Document Reviewed: 02/15/2013 [facility]  Patient Information 2015 [place], [place]. This information is not intended to replace advice given to you by your health care provider. Make sure you discuss any questions you have with your health care provider. Herpes During and After Pregnancy Genital herpes is a sexually transmitted infection (STI). It is caused by a virus and can be very serious during pregnancy. The greatest concern is passing the virus to the fetus and newborn. The virus is more likely to be passed to the newborn during delivery if the mother becomes infected for the first time late in her pregnancy (primary infection). It is less common for the virus to pass to the newborn if the mother had herpes before becoming pregnant. This is because antibodies against the virus develop over a period of time. These antibodies help protect the baby. Lastly, the infection can pass to the fetus through the placenta. This can happen if the mother gets herpes for the first time in the first 3 months of her pregnancy (first trimester). This can possibly cause a miscarriage or birth defects in the baby. TREATMENT DURING PREGNANCY Medicines may be prescribed that are safe for the mother and the fetus. The medicine can lessen symptoms or prevent a recurrence of the infection. If the infection happened before becoming pregnant, medicine may be prescribed in the last 4 weeks of the pregnancy. This can prevent a recurrent infection at the time of delivery.  RECOMMENDED DELIVERY METHOD If an active, recurrent infection is present at the time delivery, the baby should be delivered by cesarean delivery. This is because the virus can pass to the baby through an infected birth canal. This can cause severe problems for the baby. If the infection happens for the first time late in the pregnancy, the caregiver may also recommend a cesarean delivery. With a new infection, the body has not had the time to build up enough antibodies against the virus to protect the baby  from getting the infection. Even if the birth canal does not have visible sores (lesions) from the herpes virus, there is still that chance that the virus can spread to the baby. A cesarean delivery should be done if there is any signs or feeling of an infection being present in the genital area. Cesarean delivery is not recommended for women with a history of herpes infection but no evidence of active genital lesions at the time of delivery. Lesions that have crusted fully are considered healed and not active. BREASTFEEDING  Women infected with genital herpes can breastfeed their baby. The virus will not be present in the breast milk. If lesions are present on the breast, the baby should not breastfeed from the affected breast(s). HOW TO PREVENT PASSING HERPES TO YOUR BABY AFTER DELIVERY  Wash your hands with soap and water often and before touching your baby.  If you have an outbreak, keep the area clean and covered.  Try to avoid physical and stressful situations that may bring on an outbreak. SEEK IMMEDIATE MEDICAL CARE IF:   You have an outbreak during pregnancy and cannot urinate.  You have an outbreak anytime during your pregnancy and especially in the last 3 months of the pregnancy.  You think you are having an allergic reaction or side effects from the medicine you are taking. Document Released: 08/12/2000 Document Revised: 07/29/2011 Document Reviewed: 04/16/2011 [facility] Patient Information 2015 [place], [place]. This information is not intended to replace advice given to you by your health care provider. Make sure you discuss any questions you have with your health care provider.

## 2015-02-08 NOTE — MAU Note (Signed)
Urine in lab 

## 2015-02-08 NOTE — MAU Note (Signed)
Faculty to assess patient.

## 2015-02-28 ENCOUNTER — Other Ambulatory Visit (HOSPITAL_COMMUNITY): Payer: Self-pay | Admitting: Nurse Practitioner

## 2015-02-28 ENCOUNTER — Ambulatory Visit (HOSPITAL_COMMUNITY)
Admission: RE | Admit: 2015-02-28 | Discharge: 2015-02-28 | Disposition: A | Discharge status: Home / Self Care | Payer: Medicaid Other | Source: Ambulatory Visit | Attending: Nurse Practitioner | Admitting: Nurse Practitioner

## 2015-02-28 DIAGNOSIS — Z3A40 40 weeks gestation of pregnancy: Secondary | ICD-10-CM | POA: Diagnosis not present

## 2015-02-28 DIAGNOSIS — Z36 Encounter for antenatal screening of mother: Secondary | ICD-10-CM | POA: Insufficient documentation

## 2015-02-28 DIAGNOSIS — Z369 Encounter for antenatal screening, unspecified: Secondary | ICD-10-CM

## 2015-02-28 DIAGNOSIS — O480 Post-term pregnancy: Secondary | ICD-10-CM | POA: Diagnosis not present

## 2015-03-02 ENCOUNTER — Telehealth (HOSPITAL_COMMUNITY): Payer: Self-pay | Admitting: *Deleted

## 2015-03-02 NOTE — Telephone Encounter (Signed)
Preadmission screen  

## 2015-03-04 ENCOUNTER — Inpatient Hospital Stay (HOSPITAL_COMMUNITY)
Admission: RE | Admit: 2015-03-04 | Discharge: 2015-03-06 | DRG: 775 | Disposition: A | Discharge status: Home / Self Care | Payer: Medicaid Other | Source: Ambulatory Visit | Attending: Obstetrics and Gynecology | Admitting: Obstetrics and Gynecology

## 2015-03-04 ENCOUNTER — Inpatient Hospital Stay (HOSPITAL_COMMUNITY): Payer: Medicaid Other | Admitting: Anesthesiology

## 2015-03-04 ENCOUNTER — Encounter (HOSPITAL_COMMUNITY): Payer: Self-pay

## 2015-03-04 DIAGNOSIS — O99824 Streptococcus B carrier state complicating childbirth: Secondary | ICD-10-CM | POA: Diagnosis present

## 2015-03-04 DIAGNOSIS — O480 Post-term pregnancy: Principal | ICD-10-CM | POA: Diagnosis present

## 2015-03-04 DIAGNOSIS — O134 Gestational [pregnancy-induced] hypertension without significant proteinuria, complicating childbirth: Secondary | ICD-10-CM | POA: Diagnosis present

## 2015-03-04 DIAGNOSIS — Z8249 Family history of ischemic heart disease and other diseases of the circulatory system: Secondary | ICD-10-CM

## 2015-03-04 DIAGNOSIS — Z825 Family history of asthma and other chronic lower respiratory diseases: Secondary | ICD-10-CM | POA: Diagnosis not present

## 2015-03-04 DIAGNOSIS — Z3A41 41 weeks gestation of pregnancy: Secondary | ICD-10-CM | POA: Diagnosis not present

## 2015-03-04 LAB — COMPREHENSIVE METABOLIC PANEL
ALT: 13 U/L — ABNORMAL LOW (ref 14–54)
AST: 17 U/L (ref 15–41)
Albumin: 3 g/dL — ABNORMAL LOW (ref 3.5–5.0)
Alkaline Phosphatase: 149 U/L — ABNORMAL HIGH (ref 38–126)
Anion gap: 9 (ref 5–15)
BILIRUBIN TOTAL: 0.8 mg/dL (ref 0.3–1.2)
CHLORIDE: 105 mmol/L (ref 101–111)
CO2: 21 mmol/L — ABNORMAL LOW (ref 22–32)
CREATININE: 0.53 mg/dL (ref 0.44–1.00)
Calcium: 8.7 mg/dL — ABNORMAL LOW (ref 8.9–10.3)
Glucose, Bld: 83 mg/dL (ref 65–99)
POTASSIUM: 3.7 mmol/L (ref 3.5–5.1)
Sodium: 135 mmol/L (ref 135–145)
TOTAL PROTEIN: 6.8 g/dL (ref 6.5–8.1)

## 2015-03-04 LAB — CBC
HEMATOCRIT: 41.2 % (ref 36.0–46.0)
HEMOGLOBIN: 14.3 g/dL (ref 12.0–15.0)
MCH: 30.4 pg (ref 26.0–34.0)
MCHC: 34.7 g/dL (ref 30.0–36.0)
MCV: 87.7 fL (ref 78.0–100.0)
Platelets: 176 10*3/uL (ref 150–400)
RBC: 4.7 MIL/uL (ref 3.87–5.11)
RDW: 14.8 % (ref 11.5–15.5)
WBC: 7.6 10*3/uL (ref 4.0–10.5)

## 2015-03-04 LAB — RPR: RPR Ser Ql: NONREACTIVE

## 2015-03-04 LAB — TYPE AND SCREEN
ABO/RH(D): O POS
Antibody Screen: NEGATIVE

## 2015-03-04 LAB — HIV ANTIBODY (ROUTINE TESTING W REFLEX): HIV SCREEN 4TH GENERATION: NONREACTIVE

## 2015-03-04 MED ORDER — ONDANSETRON HCL 4 MG/2ML IJ SOLN: 4.0000 mg | Freq: Four times a day (QID) | INTRAMUSCULAR | Status: DC | PRN

## 2015-03-04 MED ORDER — LIDOCAINE HCL (PF) 1 % IJ SOLN
INTRAMUSCULAR | Status: DC | PRN
  Administered 2015-03-04 (×2): 3 mL via EPIDURAL

## 2015-03-04 MED ORDER — MISOPROSTOL 25 MCG QUARTER TABLET
25.0000 ug | ORAL_TABLET | ORAL | Status: DC
  Administered 2015-03-04 (×2): 25 ug via VAGINAL
  Filled 2015-03-04 (×2): qty 0.25

## 2015-03-04 MED ORDER — FENTANYL CITRATE (PF) 100 MCG/2ML IJ SOLN
100.0000 ug | INTRAMUSCULAR | Status: DC | PRN
  Administered 2015-03-04 (×2): 100 ug via INTRAVENOUS
  Filled 2015-03-04 (×2): qty 2

## 2015-03-04 MED ORDER — IBUPROFEN 600 MG PO TABS
600.0000 mg | ORAL_TABLET | Freq: Four times a day (QID) | ORAL | Status: DC
  Administered 2015-03-04 – 2015-03-06 (×8): 600 mg via ORAL
  Filled 2015-03-04 (×8): qty 1

## 2015-03-04 MED ORDER — WITCH HAZEL-GLYCERIN EX PADS: 1.0000 "application " | MEDICATED_PAD | CUTANEOUS | Status: DC | PRN

## 2015-03-04 MED ORDER — PENICILLIN G POTASSIUM 5000000 UNITS IJ SOLR
2.5000 10*6.[IU] | INTRAVENOUS | Status: DC
  Filled 2015-03-04 (×7): qty 2.5

## 2015-03-04 MED ORDER — LIDOCAINE HCL (PF) 1 % IJ SOLN: 30.0000 mL | INTRAMUSCULAR | Status: DC | PRN

## 2015-03-04 MED ORDER — BENZOCAINE-MENTHOL 20-0.5 % EX AERO
1.0000 "application " | INHALATION_SPRAY | CUTANEOUS | Status: DC | PRN
  Administered 2015-03-04: 1 via TOPICAL
  Filled 2015-03-04: qty 56

## 2015-03-04 MED ORDER — ACETAMINOPHEN 325 MG PO TABS: 650.0000 mg | ORAL_TABLET | ORAL | Status: DC | PRN

## 2015-03-04 MED ORDER — TERBUTALINE SULFATE 1 MG/ML IJ SOLN: 0.2500 mg | Freq: Once | INTRAMUSCULAR | Status: DC | PRN

## 2015-03-04 MED ORDER — ZOLPIDEM TARTRATE 5 MG PO TABS: 5.0000 mg | ORAL_TABLET | Freq: Every evening | ORAL | Status: DC | PRN

## 2015-03-04 MED ORDER — TETANUS-DIPHTH-ACELL PERTUSSIS 5-2.5-18.5 LF-MCG/0.5 IM SUSP: 0.5000 mL | Freq: Once | INTRAMUSCULAR | Status: DC

## 2015-03-04 MED ORDER — CITRIC ACID-SODIUM CITRATE 334-500 MG/5ML PO SOLN: 30.0000 mL | ORAL | Status: DC | PRN

## 2015-03-04 MED ORDER — ONDANSETRON HCL 4 MG PO TABS: 4.0000 mg | ORAL_TABLET | ORAL | Status: DC | PRN

## 2015-03-04 MED ORDER — LACTATED RINGERS IV SOLN: 500.0000 mL | INTRAVENOUS | Status: DC | PRN

## 2015-03-04 MED ORDER — LANOLIN HYDROUS EX OINT: TOPICAL_OINTMENT | CUTANEOUS | Status: DC | PRN

## 2015-03-04 MED ORDER — ONDANSETRON HCL 4 MG/2ML IJ SOLN: 4.0000 mg | INTRAMUSCULAR | Status: DC | PRN

## 2015-03-04 MED ORDER — SENNOSIDES-DOCUSATE SODIUM 8.6-50 MG PO TABS
2.0000 | ORAL_TABLET | ORAL | Status: DC
  Administered 2015-03-04 – 2015-03-05 (×2): 2 via ORAL
  Filled 2015-03-04 (×2): qty 2

## 2015-03-04 MED ORDER — EPHEDRINE 5 MG/ML INJ: 10.0000 mg | INTRAVENOUS | Status: DC | PRN

## 2015-03-04 MED ORDER — FENTANYL 2.5 MCG/ML BUPIVACAINE 1/10 % EPIDURAL INFUSION (WH - ANES)
14.0000 mL/h | INTRAMUSCULAR | Status: DC | PRN
  Administered 2015-03-04 (×2): 14 mL/h via EPIDURAL
  Filled 2015-03-04: qty 125

## 2015-03-04 MED ORDER — LACTATED RINGERS IV SOLN
INTRAVENOUS | Status: DC
  Administered 2015-03-04: 11:00:00 via INTRAVENOUS

## 2015-03-04 MED ORDER — DIPHENHYDRAMINE HCL 25 MG PO CAPS: 25.0000 mg | ORAL_CAPSULE | Freq: Four times a day (QID) | ORAL | Status: DC | PRN

## 2015-03-04 MED ORDER — DIBUCAINE 1 % RE OINT: 1.0000 "application " | TOPICAL_OINTMENT | RECTAL | Status: DC | PRN

## 2015-03-04 MED ORDER — OXYTOCIN 40 UNITS IN LACTATED RINGERS INFUSION - SIMPLE MED
1.0000 m[IU]/min | INTRAVENOUS | Status: DC
  Administered 2015-03-04: 2 m[IU]/min via INTRAVENOUS

## 2015-03-04 MED ORDER — DEXTROSE 5 % IV SOLN
5.0000 10*6.[IU] | Freq: Once | INTRAVENOUS | Status: AC
  Administered 2015-03-04: 5 10*6.[IU] via INTRAVENOUS
  Filled 2015-03-04: qty 5

## 2015-03-04 MED ORDER — VALACYCLOVIR HCL 500 MG PO TABS
500.0000 mg | ORAL_TABLET | Freq: Two times a day (BID) | ORAL | Status: DC
  Administered 2015-03-04 – 2015-03-06 (×4): 500 mg via ORAL
  Filled 2015-03-04 (×6): qty 1

## 2015-03-04 MED ORDER — DIPHENHYDRAMINE HCL 50 MG/ML IJ SOLN: 12.5000 mg | INTRAMUSCULAR | Status: DC | PRN

## 2015-03-04 MED ORDER — OXYTOCIN 40 UNITS IN LACTATED RINGERS INFUSION - SIMPLE MED
62.5000 mL/h | INTRAVENOUS | Status: DC
Start: 2015-03-04 — End: 2015-03-04
  Filled 2015-03-04: qty 1000

## 2015-03-04 MED ORDER — PRENATAL MULTIVITAMIN CH
1.0000 | ORAL_TABLET | Freq: Every day | ORAL | Status: DC
  Administered 2015-03-05 – 2015-03-06 (×2): 1 via ORAL
  Filled 2015-03-04 (×2): qty 1

## 2015-03-04 MED ORDER — OXYCODONE-ACETAMINOPHEN 5-325 MG PO TABS
1.0000 | ORAL_TABLET | ORAL | Status: DC | PRN
  Administered 2015-03-04 – 2015-03-06 (×4): 1 via ORAL
  Filled 2015-03-04 (×4): qty 1

## 2015-03-04 MED ORDER — INFLUENZA VAC SPLIT QUAD 0.5 ML IM SUSY
0.5000 mL | PREFILLED_SYRINGE | INTRAMUSCULAR | Status: AC
  Administered 2015-03-05: 0.5 mL via INTRAMUSCULAR
  Filled 2015-03-04: qty 0.5

## 2015-03-04 MED ORDER — SIMETHICONE 80 MG PO CHEW: 80.0000 mg | CHEWABLE_TABLET | ORAL | Status: DC | PRN

## 2015-03-04 MED ORDER — PHENYLEPHRINE 40 MCG/ML (10ML) SYRINGE FOR IV PUSH (FOR BLOOD PRESSURE SUPPORT)
80.0000 ug | PREFILLED_SYRINGE | INTRAVENOUS | Status: DC | PRN
Start: 2015-03-04 — End: 2015-03-04
  Filled 2015-03-04: qty 20

## 2015-03-04 MED ORDER — OXYTOCIN BOLUS FROM INFUSION
500.0000 mL | INTRAVENOUS | Status: DC
Start: 2015-03-04 — End: 2015-03-04
  Administered 2015-03-04: 500 mL via INTRAVENOUS

## 2015-03-04 MED ORDER — OXYTOCIN 40 UNITS IN LACTATED RINGERS INFUSION - SIMPLE MED: 62.5000 mL/h | INTRAVENOUS | Status: DC | PRN

## 2015-03-04 MED ORDER — OXYCODONE-ACETAMINOPHEN 5-325 MG PO TABS: 2.0000 | ORAL_TABLET | ORAL | Status: DC | PRN

## 2015-03-04 MED ORDER — OXYCODONE-ACETAMINOPHEN 5-325 MG PO TABS: 1.0000 | ORAL_TABLET | ORAL | Status: DC | PRN

## 2015-03-04 NOTE — Anesthesia Preprocedure Evaluation (Addendum)
Anesthesia Evaluation  Patient identified by MRN, date of birth, ID band Patient awake    Reviewed: Allergy & Precautions, NPO status , Patient's Chart, lab work & pertinent test results  Airway Mallampati: II  TM Distance: >3 FB Neck ROM: Full    Dental  (+) Teeth Intact   Pulmonary neg pulmonary ROS,    breath sounds clear to auscultation       Cardiovascular negative cardio ROS   Rhythm:Regular Rate:Normal     Neuro/Psych PSYCHIATRIC DISORDERS Depression negative neurological ROS     GI/Hepatic negative GI ROS, Neg liver ROS,   Endo/Other  negative endocrine ROS  Renal/GU negative Renal ROS  negative genitourinary   Musculoskeletal negative musculoskeletal ROS (+)   Abdominal   Peds negative pediatric ROS (+)  Hematology negative hematology ROS (+)   Anesthesia Other Findings   Reproductive/Obstetrics (+) Pregnancy                            Lab Results  Component Value Date   WBC 7.6 03/04/2015   HGB 14.3 03/04/2015   HCT 41.2 03/04/2015   MCV 87.7 03/04/2015   PLT 176 03/04/2015   No results found for: INR, PROTIME   Anesthesia Physical Anesthesia Plan  ASA: III  Anesthesia Plan: Epidural   Post-op Pain Management:    Induction:   Airway Management Planned:   Additional Equipment:   Intra-op Plan:   Post-operative Plan:   Informed Consent: I have reviewed the patients History and Physical, chart, labs and discussed the procedure including the risks, benefits and alternatives for the proposed anesthesia with the patient or authorized representative who has indicated his/her understanding and acceptance.     Plan Discussed with:   Anesthesia Plan Comments: (Patient identified. Risks/Benefits/Options discussed with patient including but not limited to bleeding, infection, nerve damage, paralysis, failed block, incomplete pain control, headache, blood pressure  changes, nausea, vomiting, reactions to medications, itching and postpartum back pain. Confirmed with bedside nurse the patient's most recent platelet count. Confirmed with patient that they are not currently taking any anticoagulation, have any bleeding history or any family history of bleeding disorders. Patient expressed understanding and wished to proceed. All questions were answered. Sterile technique was used throughout the entire procedure. Please see nursing notes for vital signs. Test dose was given through epidural catheter and negative prior to continuing to dose epidural or start infusion. Warning signs of high block given to the patient including shortness of breath, tingling/numbness in hands, complete motor block, or any concerning symptoms with instructions to call for help. Patient was given instructions on fall risk and not to get out of bed. All questions and concerns addressed with instructions to call with any issues or inadequate analgesia.    )        Anesthesia Quick Evaluation

## 2015-03-04 NOTE — Progress Notes (Signed)
Heidi KnifeJustasia D Estes is a 19 y.o. G2P1001 at [redacted]w[redacted]d admitted for induction of labor due to Post dates. Due date 02/23/15.  Subjective: Patient doing well. No issues at this time. Loved ones at bedside.  Objective: BP 139/94 mmHg  Pulse 84  Temp(Src) 98.4 F (36.9 C) (Oral)  Resp 18  Ht 5\' 1"  (1.549 m)  Wt 100.245 kg (221 lb)  BMI 41.78 kg/m2  LMP 05/19/2014    FHT:  FHR: 135 bpm, variability: moderate,  accelerations:  Present,  decelerations:  Absent UC:   irregular, every 3-816min  SVE:   Dilation: 3.5 Effacement (%): 50 Station: -2 Exam by:: K. Booker, cnm  Pitocin initiated   Labs: Lab Results  Component Value Date   WBC 7.6 03/04/2015   HGB 14.3 03/04/2015   HCT 41.2 03/04/2015   MCV 87.7 03/04/2015   PLT 176 03/04/2015    Assessment / Plan: IOL for postdates. starting Pit now  Labor: Progressing normally Fetal Wellbeing:  Category I Pain Control:  epidural desired Pre-eclampsia: no\ I/D:  pos Anticipated MOD:  NSVD  Heidi DeltonIan D McKeag, MD,MS,  PGY2 03/04/2015 12:52 PM

## 2015-03-04 NOTE — H&P (Signed)
Heidi KnifeJustasia D Estes is a 19 y.o. female 142P1001 @ 41.2wks presenting for for IOL due to post term. Denies leaking or bldg. No N/V/D, visual disturbances or RUQ pain. [facility] and has been remarkable for 1) hx PPD 2) HSV- no outbreak 3) +GBS  History OB History    Gravida Para Term Preterm AB TAB SAB Ectopic Multiple Living   2 1 1  0      1     Past Medical History  Diagnosis Date  . Postpartum depression   . Multiple allergies    Past Surgical History  Procedure Laterality Date  . Wisdom tooth extraction Bilateral 07/2013    x4   Family History: family history includes Asthma in her mother; Hypertension in her maternal aunt and mother. Social History:  reports that she has never smoked. She has never used smokeless tobacco. She reports that she does not drink alcohol or use illicit drugs.   Prenatal Transfer Tool  Maternal Diabetes: No Genetic Screening: Normal Maternal Ultrasounds/Referrals: Normal Fetal Ultrasounds or other Referrals:  None Maternal Substance Abuse:  No Significant Maternal Medications:  Meds include: Other:  Valtrex Significant Maternal Lab Results:  Lab values include: Group B Strep positive Other Comments:  None  ROS    Height 5\' 1"  (1.549 m), weight 100.245 kg (221 lb), last menstrual period 05/19/2014. Exam Physical Exam  Constitutional: She is oriented to person, place, and time. She appears well-developed.  HENT:  Head: Normocephalic.  Neck: Normal range of motion.  Cardiovascular: Normal rate.   Respiratory: Effort normal.  GI:  EFM 120s, +accels, no decels, occ mi variables Irreg ctx  Musculoskeletal: Normal range of motion.  Neurological: She is alert and oriented to person, place, and time.  Skin: Skin is warm and dry.  Psychiatric: She has a normal mood and affect. Her behavior is normal. Thought content normal.    Prenatal labs: ABO, Rh: O/Positive/-- (04/18 0000) Antibody: Negative (04/18 0000) Rubella:  Immune (04/18 0000) RPR: Non Reactive (05/03 1935)  HBsAg: Negative (04/18 0000)  HIV: Non-reactive (04/18 0000)  GBS: Positive (09/08 0000)   Assessment/Plan: IUP@41 .2wks Unfavorable cx  [facility] cytotec for cx ripening Expectant management Anticipate SVD   SHAW, KIMBERLY CNM 03/04/2015, 4:09 AM

## 2015-03-04 NOTE — Progress Notes (Addendum)
Heidi Estes is a 19 y.o. G2P1001 at 4639w2d admitted for induction of labor due to Post dates. Due date 02/23/15.  Subjective: Doing well. Reports some vaginal itching. At the very end of our visit patient informed us of HSV outbreak ~2wks ago. Has not bee taking valtrex.   Objective: BP 139/100 mmHg  Pulse 71  Temp(Src) 98.5 F (36.9 C) (Oral)  Resp 20  Ht 5\' 1"  (1.549 m)  Wt 100.245 kg (221 lb)  BMI 41.78 kg/m2  SpO2 100%  LMP 05/19/2014   GEN: NAD A/O VAGINAL: no evidence of ulcerations or healing lesions. Bilateral abrasions noted from repeated itching. FHT:  FHR: 145 bpm, variability: moderate,  accelerations:  Present,  decelerations:  Absent UC:   irregular, every 5 minutes SVE:   Dilation: 6.5 Effacement (%): 100 Station: -1 Exam by:: Heidi ClockS. Oklesh RN  Pitocin @ 2 mu/min  Labs: Lab Results  Component Value Date   WBC 7.6 03/04/2015   HGB 14.3 03/04/2015   HCT 41.2 03/04/2015   MCV 87.7 03/04/2015   PLT 176 03/04/2015    Assessment / Plan: Induction of labor due to postterm,  progressing well on pitocin AROM performed ~4:15pm. Clear fluid noted  Labor: Progressing normally Fetal Wellbeing:  Category I Pain Control:  Epidural Pre-eclampsia: CMP and Pro/Cr pending (ordered 2/2 to HTN) I/D:  GBS pos Anticipated MOD:  NSVD  Heidi DeltonIan D McKeag, MD,MS,  PGY2 03/04/2015 4:18 PM   White chronically irritated vulvar tissue bilaterally- pt states she has frequent yeast infections. AROM large amount clear fluid by Heidi Estes, then pt states she is supposed to be taking valtrex, but has not been taking as directed. 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 Does have a fissure in Rt lower labia majora in middle of white irritated tissue.  Discussed w/ Dr. 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 Ok for vaginal birth.  Heidi Estes, CNM,  [facility]NP[facility] 03/04/2015 4:53 PM

## 2015-03-04 NOTE — Anesthesia Procedure Notes (Signed)
Epidural Patient location during procedure: OB Start time: 03/04/2015 3:40 PM End time: 03/04/2015 3:49 PM  Staffing Anesthesiologist: Shona SimpsonHOLLIS, Avalin Briley D Performed by: anesthesiologist   Preanesthetic Checklist Completed: patient identified, site marked, surgical consent, pre-op evaluation, timeout performed, IV checked, risks and benefits discussed and monitors and equipment checked  Epidural Patient position: sitting Prep: DuraPrep Patient monitoring: heart rate, continuous pulse ox and blood pressure Approach: midline Location: L4-L5 Injection technique: LOR saline  Needle:  Needle type: Tuohy  Needle gauge: 18 G Needle length: 9 cm and 9 Catheter type: closed end flexible Catheter size: 20 Guage Test dose: negative and Other  Assessment Events: blood not aspirated, injection not painful, no injection resistance, negative IV test and no paresthesia  Additional Notes LOR @ 7  Patient tolerated the insertion well without complications.Reason for block:procedure for pain

## 2015-03-05 ENCOUNTER — Encounter (HOSPITAL_COMMUNITY): Payer: Self-pay

## 2015-03-05 NOTE — Anesthesia Postprocedure Evaluation (Signed)
Anesthesia Post Note  Patient: Heidi Estes  Procedure(s) Performed: * No procedures listed *  Anesthesia type: Epidural  Patient location: Mother/Baby  Post pain: Pain level controlled  Post assessment: Post-op Vital signs reviewed  Last Vitals:  Filed Vitals:   03/05/15 0605  BP: 147/96  Pulse: 100  Temp: 36.9 C  Resp: 18    Post vital signs: Reviewed  Level of consciousness:alert  Complications: No apparent anesthesia complications

## 2015-03-05 NOTE — Progress Notes (Signed)
CLINICAL SOCIAL WORK MATERNAL/CHILD NOTE  Patient Details  Name: Heidi Estes MRN: [id number] Date of Birth: 03/04/2017  Date: 03/05/2015  Clinical Social Worker Initiating Note: Sowmya SmithDate/ Time Initiated: 03/05/15/1300   Child's Name: Heidi Estes   Legal Guardian:  (Parents Heidi Estes and Heidi MiniumJustasia Urbanek)   Need for Interpreter: None   Date of Referral: 03/04/15   Reason for Referral: Other (Comment)   Referral Source: [facility]   Address: [address] [place], [place] [address]  Phone number:  [phone])   Household Members: Significant Other, Relatives, Minor Children   Natural Supports (not living in the home): Extended Family   Professional Supports:None   Employment: (Both parents are employed)   Type of Work:     Education:     Surveyor, quantityinancial Resources:Medicaid   Other Resources: [employer]   Cultural/Religious Considerations Which May Impact Care:none noted   Strengths: Ability to meet basic needs , Home prepared for child    Risk Factors/Current Problems:  (Hx of PP Depression)   Cognitive State: Alert , Able to Concentrate    Mood/Affect: Happy    CSW Assessment: Acknowledged order for Social Work consult to assess mother's history of PP Depression. MOB was pleasant and receptive to CSW. She is a single parent with one other dependent age 384. Mother acknowledged hx of PP Depression and states that she was treated with zoloft. Informed that the Zoloft was re-started as a preventative. She reports no current symptoms of anxiety or depression. Mother denies any hx of substance abuse. No acute social concerns related at this time. Mother informed of social work Surveyor, miningavailability.   CSW Plan/Description:    Allowed mother to talk about her previous hx with PP Depression, and discussed available resources No current barriers to discharge   Ellesse Antenucci J,  LCSW 03/05/2015, 3:55 PM

## 2015-03-05 NOTE — Lactation Note (Signed)
This note was copied from the chart of Heidi Estes. Lactation Consultation Note  Patient Name: Heidi Midge MiniumJustasia Tadesse ONGEX'BToday's Date: 03/05/2015 Reason for consult: Initial assessment  BF mom that is worried that she is not making enough milk. She was finishing up pumping upon entry and talking to RN about formula. Baby was cueing, MGM suggested that baby be put to breast. Baby was sucking on his lips and tongue back into his mouth but went to breast easily. Mom reported latch was comfortable. Baby fell asleep, mom tried waking baby but he was too sleepy to get a good latch. She wanted to give her pumped milk. Given a curved tip syringe and instructed MGM and maternal great grandmother how to use the syringe. Went over belly size, breast changes/engorgement, nipple care, pumping, manual expression, contacting WIC, and O/P lactation along with support groups. Mom will call as needed for bf help.      Maternal Data Has patient been taught Hand Expression?: Yes Does the patient have breastfeeding experience prior to this delivery?: Yes  Feeding Feeding Type: Breast Fed Length of feed: 10 min  LATCH Score/Interventions Latch: Grasps breast easily, tongue down, lips flanged, rhythmical sucking. Intervention(s): Skin to skin  Audible Swallowing: Spontaneous and intermittent Intervention(s): Hand expression  Type of Nipple: Everted at rest and after stimulation  Comfort (Breast/Nipple): Soft / non-tender     Hold (Positioning): Assistance needed to correctly position infant at breast and maintain latch. Intervention(s): Support Pillows  LATCH Score: 9  Lactation Tools Discussed/Used WIC Program: Yes   Consult Status Consult Status: Follow-up Date: 03/06/15 Follow-up type: In-patient    [name] 03/05/2015, 6:26 PM

## 2015-03-05 NOTE — Progress Notes (Signed)
Post Partum Day 1 Subjective: Eating, drinking, voiding, ambulating well.  +flatus.  Lochia and pain wnl.  Denies dizziness, lightheadedness, or sob. No complaints.  Denies ha, scotomata, ruq/epigastric pain, n/v.    Objective: Blood pressure 147/96, pulse 100, temperature 98.4 F (36.9 C), temperature source Oral, resp. rate 18, height 5\' 1"  (1.549 m), weight 100.245 kg (221 lb), last menstrual period 05/19/2014, SpO2 98 %, unknown if currently breastfeeding.   Physical Exam:  General: alert, cooperative and no distress Lochia: appropriate Uterine Fundus: firm Incision: n/a DVT Evaluation: No evidence of DVT seen on physical exam. Negative Homan's sign. No cords or calf tenderness. No significant calf/ankle edema. 2+ DTRs, no clonus, 1+BLE edema   Recent Labs  03/04/15 0300  HGB 14.3  HCT 41.2    Assessment/Plan: Plan for discharge tomorrow, Breastfeeding and Contraception paragard  OP circ Continue to monitor bp's, CBC/CMP yesterday were normal, urine protein creatinine ratio was never collected as ordered, pt asymptomatic   LOS: 1 day   Marge DuncansBooker, Heidi Estes 03/05/2015, 7:37 AM

## 2015-03-06 MED ORDER — IBUPROFEN 600 MG PO TABS: 600.0000 mg | ORAL_TABLET | Freq: Four times a day (QID) | ORAL | Status: DC

## 2015-03-06 NOTE — Discharge Summary (Signed)
OB Discharge Summary  Patient Name: Heidi Estes DOB: 02-28-1996 MRN: [id number]  Date of admission: 03/04/2015 Delivering MD: Shawna ClampBOOKER, KIMBERLY R   Date of discharge: 03/06/2015  Admitting diagnosis: 41wks, Induction Intrauterine pregnancy: 7480w2d     Secondary diagnosis: Gestational Hypertension     Discharge diagnosis: Term Pregnancy Delivered                                                                                                Post partum procedures:nkne  Augmentation: Pitocin  Complications: None  Hospital course:  Induction of Labor With Vaginal Delivery   19 y.o. yo [id number] at 5080w2d was admitted to the hospital 03/04/2015 for induction of labor.  Indication for induction: Postdates.  Patient had an uncomplicated labor course as follows: Membrane Rupture Time/Date: 4:12 PM ,03/04/2015   Intrapartum Procedures: Episiotomy: None [1]                                         Lacerations:  None [1]  Patient had delivery of a Viable infant.  Information for the patient's newborn:  Heidi Estes [[id number]][[id number]]  Delivery Method: Vaginal, Spontaneous Delivery (Filed from Delivery Summary)   03/04/2015  Details of delivery can be found in separate delivery note.  Patient had a routine postpartum course. Patient is discharged home No discharge date for patient encounter.    Physical exam  Filed Vitals:   03/05/15 0605 03/05/15 0825 03/05/15 1851 03/06/15 0605  BP: 147/96 133/82 123/86 129/69  Pulse: 100 86 83 94  Temp: 98.4 F (36.9 C) 98.2 F (36.8 C) 98.6 F (37 C) 98.5 F (36.9 C)  TempSrc: Oral Oral Oral Oral  Resp: 18 18 16 18   Height:      Weight:      SpO2:   100%    General: alert, cooperative and no distress Lochia: appropriate Uterine Fundus: firm Incision: N/A DVT Evaluation: No evidence of DVT seen on physical exam. Negative Homan's sign. No cords or calf tenderness. Labs: Lab Results  Component Value Date   WBC 7.6  03/04/2015   HGB 14.3 03/04/2015   HCT 41.2 03/04/2015   MCV 87.7 03/04/2015   PLT 176 03/04/2015   CMP Latest Ref Rng 03/04/2015  Glucose 65 - 99 mg/dL 83  BUN 6 - 20 mg/dL <1(H<5(L)  Creatinine 0.860.44 - 1.00 mg/dL 5.780.53  Sodium 469135 - 629145 mmol/L 135  Potassium 3.5 - 5.1 mmol/L 3.7  Chloride 101 - 111 mmol/L 105  CO2 22 - 32 mmol/L 21(L)  Calcium 8.9 - 10.3 mg/dL 5.2(W8.7(L)  Total Protein 6.5 - 8.1 g/dL 6.8  Total Bilirubin 0.3 - 1.2 mg/dL 0.8  Alkaline Phos 38 - 126 U/L 149(H)  AST 15 - 41 U/L 17  ALT 14 - 54 U/L 13(L)    Discharge instruction: per After Visit Summary and "Baby and Me Booklet".  Medications:  Current facility-administered medications:  .  acetaminophen (TYLENOL) tablet 650 mg, 650 mg, Oral, Q4H  PRN, Kathee Delton, MD .  benzocaine-Menthol [facility]) 20-0.5 % topical spray 1 application, 1 application, Topical, PRN, Kathee Delton, MD, 1 application at 03/04/15 1958 .  witch hazel-glycerin (TUCKS) pad 1 application, 1 application, Topical, PRN **AND** dibucaine (NUPERCAINAL) 1 % rectal ointment 1 application, 1 application, Rectal, PRN, Kathee Delton, MD .  diphenhydrAMINE (BENADRYL) capsule 25 mg, 25 mg, Oral, Q6H PRN, Kathee Delton, MD .  ibuprofen (ADVIL,MOTRIN) tablet 600 mg, 600 mg, Oral, 4 times per day, Kathee Delton, MD, 600 mg at 03/05/15 2246 .  lanolin ointment, , Topical, PRN, Kathee Delton, MD .  ondansetron [facility] tablet 4 mg, 4 mg, Oral, Q4H PRN **OR** ondansetron (ZOFRAN) injection 4 mg, 4 mg, Intravenous, Q4H PRN, Kathee Delton, MD .  oxyCODONE-acetaminophen (PERCOCET/ROXICET) 5-325 MG per tablet 1 tablet, 1 tablet, Oral, Q4H PRN, Kathee Delton, MD, 1 tablet at 03/06/15 0302 .  oxytocin (PITOCIN) IV infusion 40 units in LR 1000 mL, 62.5 mL/hr, Intravenous, Continuous PRN, Kathee Delton, MD .  prenatal multivitamin tablet 1 tablet, 1 tablet, Oral, Q1200, Kathee Delton, MD, 1 tablet at 03/05/15 1158 .  senna-docusate (Senokot-S) tablet 2 tablet, 2 tablet, Oral, Q24H,  Kathee Delton, MD, 2 tablet at 03/05/15 2246 .  simethicone (MYLICON) chewable tablet 80 mg, 80 mg, Oral, PRN, Kathee Delton, MD .  valACYclovir (VALTREX) tablet 500 mg, 500 mg, Oral, BID, Kathee Delton, MD, 500 mg at 03/05/15 2246 .  zolpidem (AMBIEN) tablet 5 mg, 5 mg, Oral, QHS PRN, Kathee Delton, MD  Facility-Administered Medications Ordered in Other Encounters:  .  lidocaine (PF) (XYLOCAINE) 1 % injection, , , Anesthesia Intra-op, Shelton Silvas, MD, 3 mL at 03/04/15 1548  Diet: routine diet  Activity: Advance as tolerated. Pelvic rest for 6 weeks.   Outpatient follow up:6 weeks  Postpartum contraception: IUD Paragard  Newborn Data: Live born female  Birth Weight: 7 lb 0.3 oz (3184 g) APGAR: 7, 9  Baby Feeding: Bottle and Breast Disposition:home with mother   03/06/2015 Ferdie Ping, CNM

## 2015-03-06 NOTE — Progress Notes (Signed)
UR chart review completed.  

## 2015-05-25 ENCOUNTER — Emergency Department (HOSPITAL_COMMUNITY)
Admission: EM | Admit: 2015-05-25 | Discharge: 2015-05-25 | Disposition: A | Discharge status: Home / Self Care | Payer: Medicaid Other | Attending: Emergency Medicine | Admitting: Emergency Medicine

## 2015-05-25 ENCOUNTER — Encounter (HOSPITAL_COMMUNITY): Payer: Self-pay | Admitting: Emergency Medicine

## 2015-05-25 DIAGNOSIS — Z8659 Personal history of other mental and behavioral disorders: Secondary | ICD-10-CM | POA: Insufficient documentation

## 2015-05-25 DIAGNOSIS — J069 Acute upper respiratory infection, unspecified: Secondary | ICD-10-CM | POA: Insufficient documentation

## 2015-05-25 DIAGNOSIS — R0981 Nasal congestion: Secondary | ICD-10-CM | POA: Diagnosis present

## 2015-05-25 DIAGNOSIS — Z79899 Other long term (current) drug therapy: Secondary | ICD-10-CM | POA: Diagnosis not present

## 2015-05-25 NOTE — ED Provider Notes (Addendum)
CSN: [id number]     Arrival date & time 05/25/15  4098 History   First MD Initiated Contact with Patient 05/25/15 0920     Chief Complaint  Patient presents with  . diarrhea/congestion     HPI Patient presents to the emergency room with complaints of flu-type symptoms for the past week. Mom brought herself and her 2 children in to be evaluated.  The patient lives at home with her mother who was recently admitted to the hospital for a flu illness. This is What prompted mom to bring herself and her kids in.  They have been coughing and have had some congestion. At Times they have felt warm but no definite fevers. Symptoms started about a week ago. Not Getting worse but they have not completely resolved.  Past Medical History  Diagnosis Date  . Postpartum depression   . Multiple allergies    Past Surgical History  Procedure Laterality Date  . Wisdom tooth extraction Bilateral 07/2013    x4   Family History  Problem Relation Age of Onset  . Asthma Mother   . Hypertension Mother   . Hypertension Maternal Aunt    Social History  Substance Use Topics  . Smoking status: Never Smoker   . Smokeless tobacco: Never Used  . Alcohol Use: No     Comment: social   OB History    Gravida Para Term Preterm AB TAB SAB Ectopic Multiple Living   2 2 2  0     0 2     Review of Systems  All other systems reviewed and are negative.     Allergies  Macrobid  Home Medications   Prior to Admission medications   Medication Sig Start Date End Date Taking? Authorizing Provider  EPINEPHrine (EPIPEN 2-PAK IJ) Inject 1 applicator as directed once as needed (emergency medication).    Historical Provider, MD  ibuprofen (ADVIL,MOTRIN) 600 MG tablet Take 1 tablet (600 mg total) by mouth every 6 (six) hours. 03/06/15   Montez Morita, CNM  Prenatal Vit-Fe Fumarate-FA (PRENATAL MULTIVITAMIN) TABS tablet Take 1 tablet by mouth daily at 12 noon.    Historical Provider, MD  valACYclovir (VALTREX) 500 MG tablet  Take 1 tablet (500 mg total) by mouth 2 (two) times daily. 02/08/15   Marquette Saa, MD   BP 143/97 mmHg  Pulse 88  Temp(Src) 98.2 F (36.8 C) (Oral)  Resp 18  SpO2 99%  LMP 05/25/2015 Physical Exam  Constitutional: She appears well-developed and well-nourished. No distress.  HENT:  Head: Normocephalic and atraumatic.  Right Ear: External ear normal.  Left Ear: External ear normal.  Eyes: Conjunctivae are normal. Right eye exhibits no discharge. Left eye exhibits no discharge. No scleral icterus.  Neck: Neck supple. No tracheal deviation present.  Cardiovascular: Normal rate, regular rhythm and intact distal pulses.   Pulmonary/Chest: Effort normal and breath sounds normal. No stridor. No respiratory distress. She has no wheezes. She has no rales.  Abdominal: Soft. Bowel sounds are normal. She exhibits no distension. There is no tenderness. There is no rebound and no guarding.  Musculoskeletal: She exhibits no edema or tenderness.  Neurological: She is alert. She has normal strength. No cranial nerve deficit (no facial droop, extraocular movements intact, no slurred speech) or sensory deficit. She exhibits normal muscle tone. She displays no seizure activity. Coordination normal.  Skin: Skin is warm and dry. No rash noted.  Psychiatric: She has a normal mood and affect.  Nursing note and vitals reviewed.  ED Course  Procedures (including critical care time)   MDM   Pt's symptoms are suggestive of a viral illness.  Positive flu exposure but sx have been ongoing for 1 week.  Supportive care.  OTC meds PRN.  At this time there does not appear to be any evidence of an acute emergency medical condition and the patient appears stable for discharge with appropriate outpatient follow up.     Linwood DibblesJon Tye Vigo, MD 05/25/15 [id number]  Linwood DibblesJon Shenetta Schnackenberg, MD 05/25/15 1000

## 2015-05-25 NOTE — Discharge Instructions (Signed)
Upper Respiratory Infection, Adult Most upper respiratory infections (URIs) are a viral infection of the air passages leading to the lungs. A URI affects the nose, throat, and upper air passages. The most common type of URI is nasopharyngitis and is typically referred to as "the common cold." URIs run their course and usually go away on their own. Most of the time, a URI does not require medical attention, but sometimes a bacterial infection in the upper airways can follow a viral infection. This is called a secondary infection. Sinus and middle ear infections are common types of secondary upper respiratory infections. Bacterial pneumonia can also complicate a URI. A URI can worsen asthma and chronic obstructive pulmonary disease (COPD). Sometimes, these complications can require emergency medical care and may be life threatening.  CAUSES Almost all URIs are caused by viruses. A virus is a type of germ and can spread from one person to another.  RISKS FACTORS You may be at risk for a URI if:   You smoke.   You have chronic heart or lung disease.  You have a weakened defense (immune) system.   You are very young or very old.   You have nasal allergies or asthma.  You work in crowded or poorly ventilated areas.  You work in health care facilities or schools. SIGNS AND SYMPTOMS  Symptoms typically develop 2-3 days after you come in contact with a cold virus. Most viral URIs last 7-10 days. However, viral URIs from the influenza virus (flu virus) can last 14-18 days and are typically more severe. Symptoms may include:   Runny or stuffy (congested) nose.   Sneezing.   Cough.   Sore throat.   Headache.   Fatigue.   Fever.   Loss of appetite.   Pain in your forehead, behind your eyes, and over your cheekbones (sinus pain).  Muscle aches.  DIAGNOSIS  Your health care provider may diagnose a URI by:  Physical exam.  Tests to check that your symptoms are not due to  another condition such as:  Strep throat.  Sinusitis.  Pneumonia.  Asthma. TREATMENT  A URI goes away on its own with time. It cannot be cured with medicines, but medicines may be prescribed or recommended to relieve symptoms. Medicines may help:  Reduce your fever.  Reduce your cough.  Relieve nasal congestion. HOME CARE INSTRUCTIONS   Take medicines only as directed by your health care provider.   Gargle warm saltwater or take cough drops to comfort your throat as directed by your health care provider.  Use a warm mist humidifier or inhale steam from a shower to increase air moisture. This may make it easier to breathe.  Drink enough fluid to keep your urine clear or pale yellow.   Eat soups and other clear broths and maintain good nutrition.   Rest as needed.   Return to work when your temperature has returned to normal or as your health care provider advises. You may need to stay home longer to avoid infecting others. You can also use a face mask and careful hand washing to prevent spread of the virus.  Increase the usage of your inhaler if you have asthma.   Do not use any tobacco products, including cigarettes, chewing tobacco, or electronic cigarettes. If you need help quitting, ask your health care provider. PREVENTION  The best way to protect yourself from getting a cold is to practice good hygiene.   Avoid oral or hand contact with people with cold   symptoms.   Wash your hands often if contact occurs.  There is no clear evidence that vitamin C, vitamin E, echinacea, or exercise reduces the chance of developing a cold. However, it is always recommended to get plenty of rest, exercise, and practice good nutrition.  SEEK MEDICAL CARE IF:   You are getting worse rather than better.   Your symptoms are not controlled by medicine.   You have chills.  You have worsening shortness of breath.  You have brown or red mucus.  You have yellow or brown nasal  discharge.  You have pain in your face, especially when you bend forward.  You have a fever.  You have swollen neck glands.  You have pain while swallowing.  You have white areas in the back of your throat. SEEK IMMEDIATE MEDICAL CARE IF:   You have severe or persistent:  Headache.  Ear pain.  Sinus pain.  Chest pain.  You have chronic lung disease and any of the following:  Wheezing.  Prolonged cough.  Coughing up blood.  A change in your usual mucus.  You have a stiff neck.  You have changes in your:  Vision.  Hearing.  Thinking.  Mood. MAKE SURE YOU:   Understand these instructions.  Will watch your condition.  Will get help right away if you are not doing well or get worse.   This information is not intended to replace advice given to you by your health care provider. Make sure you discuss any questions you have with your health care provider.   Document Released: 10/30/2000 Document Revised: 09/20/2014 Document Reviewed: 08/11/2013 Elsevier Interactive Patient Education 2016 [employer]  

## 2015-05-25 NOTE — ED Notes (Signed)
Per pt, states she has been having diarrhea and flu like symptoms for over a week

## 2015-07-21 ENCOUNTER — Encounter (HOSPITAL_COMMUNITY): Payer: Self-pay

## 2015-07-21 ENCOUNTER — Emergency Department (HOSPITAL_COMMUNITY)
Admission: EM | Admit: 2015-07-21 | Discharge: 2015-07-21 | Disposition: A | Discharge status: Home / Self Care | Payer: Medicaid Other | Attending: Emergency Medicine | Admitting: Emergency Medicine

## 2015-07-21 DIAGNOSIS — Z8659 Personal history of other mental and behavioral disorders: Secondary | ICD-10-CM | POA: Diagnosis not present

## 2015-07-21 DIAGNOSIS — Z760 Encounter for issue of repeat prescription: Secondary | ICD-10-CM

## 2015-07-21 DIAGNOSIS — N730 Acute parametritis and pelvic cellulitis: Secondary | ICD-10-CM | POA: Diagnosis not present

## 2015-07-21 DIAGNOSIS — B009 Herpesviral infection, unspecified: Secondary | ICD-10-CM | POA: Diagnosis not present

## 2015-07-21 DIAGNOSIS — Z79899 Other long term (current) drug therapy: Secondary | ICD-10-CM | POA: Insufficient documentation

## 2015-07-21 DIAGNOSIS — Z3202 Encounter for pregnancy test, result negative: Secondary | ICD-10-CM | POA: Insufficient documentation

## 2015-07-21 DIAGNOSIS — N898 Other specified noninflammatory disorders of vagina: Secondary | ICD-10-CM | POA: Diagnosis present

## 2015-07-21 DIAGNOSIS — Z791 Long term (current) use of non-steroidal anti-inflammatories (NSAID): Secondary | ICD-10-CM | POA: Diagnosis not present

## 2015-07-21 LAB — URINE MICROSCOPIC-ADD ON

## 2015-07-21 LAB — POC URINE PREG, ED: PREG TEST UR: NEGATIVE

## 2015-07-21 LAB — WET PREP, GENITAL
Clue Cells Wet Prep HPF POC: NONE SEEN
Sperm: NONE SEEN
Trich, Wet Prep: NONE SEEN
Yeast Wet Prep HPF POC: NONE SEEN

## 2015-07-21 LAB — URINALYSIS, ROUTINE W REFLEX MICROSCOPIC
BILIRUBIN URINE: NEGATIVE
Glucose, UA: NEGATIVE mg/dL
HGB URINE DIPSTICK: NEGATIVE
KETONES UR: NEGATIVE mg/dL
Nitrite: NEGATIVE
PROTEIN: NEGATIVE mg/dL
Specific Gravity, Urine: 1.023 (ref 1.005–1.030)
pH: 6.5 (ref 5.0–8.0)

## 2015-07-21 MED ORDER — CEFTRIAXONE SODIUM 250 MG IJ SOLR
250.0000 mg | Freq: Once | INTRAMUSCULAR | Status: AC
  Administered 2015-07-21: 250 mg via INTRAMUSCULAR
  Filled 2015-07-21: qty 250

## 2015-07-21 MED ORDER — VALACYCLOVIR HCL 500 MG PO TABS: 500.0000 mg | ORAL_TABLET | Freq: Two times a day (BID) | ORAL | Status: DC

## 2015-07-21 MED ORDER — LIDOCAINE HCL (PF) 1 % IJ SOLN
INTRAMUSCULAR | Status: AC
  Administered 2015-07-21: 0.9 mL
  Filled 2015-07-21: qty 5

## 2015-07-21 MED ORDER — DOXYCYCLINE HYCLATE 100 MG PO CAPS: 100.0000 mg | ORAL_CAPSULE | Freq: Two times a day (BID) | ORAL | Status: DC

## 2015-07-21 NOTE — ED Provider Notes (Signed)
CSN: [id number]     Arrival date & time 07/21/15  1516 History   First MD Initiated Contact with Patient 07/21/15 2007     Chief Complaint  Patient presents with  . Multiple complaints      HPI  [name] is an 20 y.o. female with history of HSV who presents to the ED for evaluation of vaginal discharge and med refill. Regarding her vaginal discharge, pt states she has noticed a new green vaginal discharge for the past few days. She does endorse some vaginal "burning" and states she "doesn't feel right." she does endorse unprotected intercourse recently. Denies dysuria, urinary frequency/urgency. She states she is late for her period. Denies abdominal pain. Denies fever, chills, n/v/d.   Pt is also requesting refill of her valtrex. She states she has noticed an outbreak in her genital area and states she ran out of her medicine at home.   Past Medical History  Diagnosis Date  . Postpartum depression   . Multiple allergies    Past Surgical History  Procedure Laterality Date  . Wisdom tooth extraction Bilateral 07/2013    x4   Family History  Problem Relation Age of Onset  . Asthma Mother   . Hypertension Mother   . Hypertension Maternal Aunt    Social History  Substance Use Topics  . Smoking status: Never Smoker   . Smokeless tobacco: Never Used  . Alcohol Use: No     Comment: social   OB History    Gravida Para Term Preterm AB TAB SAB Ectopic Multiple Living   2 2 2  0     0 2     Review of Systems  All other systems reviewed and are negative.     Allergies  Macrobid  Home Medications   Prior to Admission medications   Medication Sig Start Date End Date Taking? Authorizing Provider  EPINEPHrine (EPIPEN 2-PAK IJ) Inject 1 applicator as directed once as needed (emergency medication).    Historical Provider, MD  ibuprofen (ADVIL,MOTRIN) 600 MG tablet Take 1 tablet (600 mg total) by mouth every 6 (six) hours. 03/06/15   Montez MoritaMarie D Lawson, CNM  Prenatal Vit-Fe Fumarate-FA  (PRENATAL MULTIVITAMIN) TABS tablet Take 1 tablet by mouth daily at 12 noon.    Historical Provider, MD  valACYclovir (VALTREX) 500 MG tablet Take 1 tablet (500 mg total) by mouth 2 (two) times daily. 02/08/15   Marquette SaaAbigail Joseph Lancaster, MD   BP 137/94 mmHg  Pulse 94  Temp(Src) 98.1 F (36.7 C) (Oral)  Resp 16  SpO2 100%  LMP 06/17/2015 (Approximate) Physical Exam  Constitutional: She is oriented to person, place, and time.  HENT:  Right Ear: External ear normal.  Left Ear: External ear normal.  Nose: Nose normal.  Mouth/Throat: Oropharynx is clear and moist. No oropharyngeal exudate.  Eyes: Conjunctivae and EOM are normal. Pupils are equal, round, and reactive to light.  Neck: Normal range of motion. Neck supple.  Cardiovascular: Normal rate, regular rhythm, normal heart sounds and intact distal pulses.   Pulmonary/Chest: Effort normal and breath sounds normal. No respiratory distress. She has no wheezes. She exhibits no tenderness.  Abdominal: Soft. Bowel sounds are normal. She exhibits no distension. There is no tenderness. There is no rebound and no guarding.  Genitourinary:  Copious yellow-green-white discharge. Cervix non friable. +cervical motion tenderness. No adnexal tenderness. No bleeding.   Musculoskeletal: She exhibits no edema.  Neurological: She is alert and oriented to person, place, and time. No cranial nerve  deficit.  Skin: Skin is warm and dry.  Psychiatric: She has a normal mood and affect.  Nursing note and vitals reviewed.   ED Course  Procedures (including critical care time) Labs Review Labs Reviewed  WET PREP, GENITAL - Abnormal; Notable for the following:    WBC, Wet Prep HPF POC MANY (*)    All other components within normal limits  URINALYSIS, ROUTINE W REFLEX MICROSCOPIC (NOT AT [facility]) - Abnormal; Notable for the following:    Leukocytes, UA SMALL (*)    All other components within normal limits  URINE MICROSCOPIC-ADD ON - Abnormal; Notable for the  following:    Squamous Epithelial / LPF 0-5 (*)    Bacteria, UA FEW (*)    All other components within normal limits  URINE CULTURE  HIV ANTIBODY (ROUTINE TESTING)  RPR  POC URINE PREG, ED  GC/CHLAMYDIA PROBE AMP () NOT AT [facility]    Imaging Review No results found. I have personally reviewed and evaluated these images and lab results as part of my medical decision-making.   EKG Interpretation None      MDM   Final diagnoses:  PID (acute pelvic inflammatory disease)  Encounter for medication refill  HSV (herpes simplex virus) infection    Given discharge with CMT will treat as PID. IM rocephin given in the ED. Rx for doxy given as well. HIV, RPR, GC/chlamydia sent. UA negative. Per pt request rx for Valtrex given as well. Instructed to f/u with PCP. Encouraged safe sex practices and abstinence until tx completed. ER return precautions given.    Carlene Coria, PA-C 07/21/15 2204  Melene Plan, DO 07/21/15 2207

## 2015-07-21 NOTE — Discharge Instructions (Signed)
You were seen in the ER today for evaluation of vaginal discharge. We will treat you for PID. We will call you if any of your other tests come back positive. I also gave you a refill of Valtrex as you requested. Please follow up with your primary care provider within one week. Please also inform all sexual partners that you are being treated and they should get tested/treated as well. Return to the ER for new or worsening symptoms.   Pelvic Inflammatory Disease Pelvic inflammatory disease (PID) refers to an infection in some or all of the female organs. The infection can be in the uterus, ovaries, fallopian tubes, or the surrounding tissues in the pelvis. PID can cause abdominal or pelvic pain that comes on suddenly (acute pelvic pain). PID is a serious infection because it can lead to lasting (chronic) pelvic pain or the inability to have children (infertility). CAUSES This condition is most often caused by an infection that is spread during sexual contact. However, the infection can also be caused by the normal bacteria that are found in the vaginal tissues if these bacteria travel upward into the reproductive organs. PID can also occur following:  The birth of a baby.  A miscarriage.  An abortion.  Major pelvic surgery.  The use of an intrauterine device (IUD).  A sexual assault. RISK FACTORS This condition is more likely to develop in women who:  Are younger than 22 years of age.  Are [facility].  Use nonbarrier contraception.  Have multiple sexual partners.  Have sex with someone who has symptoms of an STD (sexually transmitted disease).  Use oral contraception. At times, certain behaviors can also increase the possibility of getting PID, such as:  Using a vaginal douche.  Having an IUD in place. SYMPTOMS Symptoms of this condition include:  Abdominal or pelvic pain.  Fever.  Chills.  Abnormal vaginal discharge.  Abnormal uterine  bleeding.  Unusual pain shortly after the end of a menstrual period.  Painful urination.  Pain with sexual intercourse.  Nausea and vomiting. DIAGNOSIS To diagnose this condition, your health care provider will do a physical exam and take your medical history. A pelvic exam typically reveals great tenderness in the uterus and the surrounding pelvic tissues. You may also have tests, such as:  Lab tests, including a pregnancy test, blood tests, and urine test.  Culture tests of the vagina and cervix to check for an STD.  Ultrasound.  A laparoscopic procedure to look inside the pelvis.  Examining vaginal secretions under a microscope. TREATMENT Treatment for this condition may involve one or more approaches.  Antibiotic medicines may be prescribed to be taken by mouth.  Sexual partners may need to be treated if the infection is caused by an STD.  For more severe cases, hospitalization may be needed to give antibiotics directly into a vein through an IV tube.  Surgery may be needed if other treatments do not help, but this is rare. It may take weeks until you are completely well. If you are diagnosed with PID, you should also be checked for human immunodeficiency virus (HIV). Your health care provider may test you for infection again 3 months after treatment. You should not have unprotected sex. HOME CARE INSTRUCTIONS  Take over-the-counter and prescription medicines only as told by your health care provider.  If you were prescribed an antibiotic medicine, take it as told by your health care provider. Do not stop taking the antibiotic even if you  start to feel better.  Do not have sexual intercourse until treatment is completed or as told by your health care provider. If PID is confirmed, your recent sexual partners will need treatment, especially if you had unprotected sex.  Keep all follow-up visits as told by your health care provider. This is important. SEEK MEDICAL CARE  IF:  You have increased or abnormal vaginal discharge.  Your pain does not improve.  You vomit.  You have a fever.  You cannot tolerate your medicines.  Your partner has an STD.  You have pain when you urinate. SEEK IMMEDIATE MEDICAL CARE IF:  You have increased abdominal or pelvic pain.  You have chills.  Your symptoms are not better in 72 hours even with treatment.   This information is not intended to replace advice given to you by your health care provider. Make sure you discuss any questions you have with your health care provider.   Document Released: 05/06/2005 Document Revised: 01/25/2015 Document Reviewed: 06/13/2014 Elsevier Interactive Patient Education [employer]

## 2015-07-21 NOTE — ED Notes (Signed)
Pt presents with multiple complaints. Pt c/o green vaginal discharge and vaginal itching, reports that she is late for her period. Pt also c/o headache. Pt also c/o nasal congestion. Lastly, pt is c/o problems with her memory.

## 2015-07-22 LAB — RPR: RPR: NONREACTIVE

## 2015-07-22 LAB — HIV ANTIBODY (ROUTINE TESTING W REFLEX): HIV SCREEN 4TH GENERATION: NONREACTIVE

## 2015-07-23 LAB — URINE CULTURE: Culture: 60000

## 2015-07-24 LAB — GC/CHLAMYDIA PROBE AMP (~~LOC~~) NOT AT ARMC
CHLAMYDIA, DNA PROBE: NEGATIVE
NEISSERIA GONORRHEA: NEGATIVE

## 2016-03-01 ENCOUNTER — Emergency Department (HOSPITAL_COMMUNITY)
Admission: EM | Admit: 2016-03-01 | Discharge: 2016-03-01 | Disposition: A | Discharge status: Home / Self Care | Payer: BLUE CROSS/BLUE SHIELD | Attending: Emergency Medicine | Admitting: Emergency Medicine

## 2016-03-01 ENCOUNTER — Encounter (HOSPITAL_COMMUNITY): Payer: Self-pay | Admitting: Emergency Medicine

## 2016-03-01 DIAGNOSIS — R112 Nausea with vomiting, unspecified: Secondary | ICD-10-CM | POA: Diagnosis present

## 2016-03-01 DIAGNOSIS — B349 Viral infection, unspecified: Secondary | ICD-10-CM | POA: Diagnosis not present

## 2016-03-01 LAB — URINALYSIS, ROUTINE W REFLEX MICROSCOPIC
BILIRUBIN URINE: NEGATIVE
Glucose, UA: NEGATIVE mg/dL
HGB URINE DIPSTICK: NEGATIVE
KETONES UR: NEGATIVE mg/dL
Leukocytes, UA: NEGATIVE
NITRITE: NEGATIVE
PH: 8 (ref 5.0–8.0)
Protein, ur: NEGATIVE mg/dL
SPECIFIC GRAVITY, URINE: 1.029 (ref 1.005–1.030)

## 2016-03-01 LAB — PREGNANCY, URINE: PREG TEST UR: NEGATIVE

## 2016-03-01 MED ORDER — ONDANSETRON 4 MG PO TBDP
4.0000 mg | ORAL_TABLET | Freq: Once | ORAL | Status: AC
  Administered 2016-03-01: 4 mg via ORAL
  Filled 2016-03-01: qty 1

## 2016-03-01 MED ORDER — ONDANSETRON HCL 4 MG/2ML IJ SOLN: 4.0000 mg | Freq: Once | INTRAMUSCULAR | Status: DC | PRN

## 2016-03-01 MED ORDER — ONDANSETRON 4 MG PO TBDP: 4.0000 mg | ORAL_TABLET | Freq: Three times a day (TID) | ORAL | 0 refills | Status: DC | PRN

## 2016-03-01 NOTE — ED Provider Notes (Signed)
[facility]-EMERGENCY DEPT Provider Note   CSN: [id number] Arrival date & time: 03/01/16  [id number]     History   Chief Complaint Chief Complaint  Patient presents with  . Emesis    HPI Heidi Estes is a 20 y.o. female.  HPI Patient has had around 3 days of nausea vomiting a little bit of constipation. No abdominal pain. She has had nasal congestion rightcheck a sore throat. She's had some chills. No neck pain. No chest pain. States she does ache all over. No known sick contacts. No blood in the emesis. No dysuria. No vaginal bleeding or discharge.   Past Medical History:  Diagnosis Date  . Multiple allergies   . Postpartum depression     Patient Active Problem List   Diagnosis Date Noted  . Post term pregnancy, 41 weeks 03/04/2015  . 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 WISDOM TOOTH EXTRACTION Bilateral 07/2013   x4    OB History    Gravida Para Term Preterm AB Living   2 2 2  0   2   SAB TAB Ectopic Multiple Live Births         0 2       Home Medications    Prior to Admission medications   Medication Sig Start Date End Date Taking? Authorizing Provider  EPINEPHrine (EPIPEN 2-PAK IJ) Inject 1 applicator as directed once as needed (emergency medication).   Yes Historical Provider, MD  valACYclovir (VALTREX) 500 MG tablet Take 1 tablet (500 mg total) by mouth 2 (two) times daily. Patient taking differently: Take 500 mg by mouth 2 (two) times daily as needed (cold sore flare ups).  02/08/15  Yes Marquette SaaAbigail Joseph Lancaster, MD  ondansetron (ZOFRAN-ODT) 4 MG disintegrating tablet Take 1 tablet (4 mg total) by mouth every 8 (eight) hours as needed for nausea or vomiting. 03/01/16   Benjiman CoreNathan Destaney Sarkis, MD    Family History Family History  Problem Relation Age of Onset  . Asthma Mother   . Hypertension Mother   . Hypertension Maternal Aunt     Social History Social History  Substance Use  Topics  . Smoking status: Never Smoker  . Smokeless tobacco: Never Used  . Alcohol use No     Comment: social     Allergies   Macrobid [nitrofurantoin monohyd macro]   Review of Systems Review of Systems  Constitutional: Positive for appetite change.  HENT: Positive for congestion and sore throat.   Eyes: Negative for discharge.  Respiratory: Positive for cough. Negative for shortness of breath.   Gastrointestinal: Negative for abdominal pain.  Genitourinary: Negative for dysuria and vaginal pain.  Musculoskeletal: Negative for back pain.  Skin: Negative for wound.  Neurological: Negative for headaches.  Hematological: Negative for adenopathy.  Psychiatric/Behavioral: Negative for confusion.  All other systems reviewed and are negative.    Physical Exam Updated Vital Signs BP 128/88 (BP Location: Right Arm)   Pulse 85   Temp 98.8 F (37.1 C) (Oral)   Resp 18   LMP 02/23/2016   SpO2 100%   Physical Exam   ED Treatments / Results  Labs (all labs ordered are listed, but only abnormal results are displayed) Labs Reviewed  URINALYSIS, ROUTINE W REFLEX MICROSCOPIC (NOT AT [facility])  PREGNANCY, URINE    EKG  EKG Interpretation None       Radiology No results found.  Procedures Procedures (including critical care time)  Medications  Ordered in ED Medications  ondansetron (ZOFRAN-ODT) disintegrating tablet 4 mg (4 mg Oral Given 03/01/16 1610)     Initial Impression / Assessment and Plan / ED Course  I have reviewed the triage vital signs and the nursing notes.  Pertinent labs & imaging results that were available during my care of the patient were reviewed by me and considered in my medical decision making (see chart for details).  Clinical Course    Patient with URI symptoms and nausea and vomiting. Feels better after oral Zofran and oral fluids. No further vomiting here. Discharge home with Zofran. Lungs are clear. Low suspicion for pneumonia.  Final  Clinical Impressions(s) / ED Diagnoses   Final diagnoses:  Non-intractable vomiting with nausea, unspecified vomiting type  Viral infection    New Prescriptions New Prescriptions   ONDANSETRON (ZOFRAN-ODT) 4 MG DISINTEGRATING TABLET    Take 1 tablet (4 mg total) by mouth every 8 (eight) hours as needed for nausea or vomiting.     Benjiman Core, MD 03/01/16 1010

## 2016-03-01 NOTE — ED Triage Notes (Signed)
Per pt, states cold symptoms-cough which made her vomit-states nausea-all started yesterday

## 2016-03-01 NOTE — ED Notes (Signed)
Patient is A+Ox4

## 2016-03-01 NOTE — ED Notes (Signed)
Patient given a soda and she was able to tolerate it well.

## 2016-04-16 ENCOUNTER — Emergency Department (HOSPITAL_COMMUNITY): Payer: BLUE CROSS/BLUE SHIELD

## 2016-04-16 ENCOUNTER — Emergency Department (HOSPITAL_COMMUNITY)
Admission: EM | Admit: 2016-04-16 | Discharge: 2016-04-16 | Disposition: A | Discharge status: Home / Self Care | Payer: BLUE CROSS/BLUE SHIELD | Attending: Emergency Medicine | Admitting: Emergency Medicine

## 2016-04-16 ENCOUNTER — Encounter (HOSPITAL_COMMUNITY): Payer: Self-pay | Admitting: Emergency Medicine

## 2016-04-16 DIAGNOSIS — N760 Acute vaginitis: Secondary | ICD-10-CM | POA: Insufficient documentation

## 2016-04-16 DIAGNOSIS — J069 Acute upper respiratory infection, unspecified: Secondary | ICD-10-CM | POA: Insufficient documentation

## 2016-04-16 DIAGNOSIS — B9689 Other specified bacterial agents as the cause of diseases classified elsewhere: Secondary | ICD-10-CM

## 2016-04-16 DIAGNOSIS — R05 Cough: Secondary | ICD-10-CM | POA: Diagnosis present

## 2016-04-16 LAB — URINALYSIS, ROUTINE W REFLEX MICROSCOPIC
BILIRUBIN URINE: NEGATIVE
GLUCOSE, UA: NEGATIVE mg/dL
HGB URINE DIPSTICK: NEGATIVE
Ketones, ur: NEGATIVE mg/dL
Nitrite: NEGATIVE
PROTEIN: 30 mg/dL — AB
Specific Gravity, Urine: 1.034 — ABNORMAL HIGH (ref 1.005–1.030)
pH: 8 (ref 5.0–8.0)

## 2016-04-16 LAB — WET PREP, GENITAL
Sperm: NONE SEEN
Trich, Wet Prep: NONE SEEN
YEAST WET PREP: NONE SEEN

## 2016-04-16 LAB — PREGNANCY, URINE: PREG TEST UR: NEGATIVE

## 2016-04-16 LAB — URINE MICROSCOPIC-ADD ON
Bacteria, UA: NONE SEEN
RBC / HPF: NONE SEEN RBC/hpf (ref 0–5)

## 2016-04-16 MED ORDER — LIDOCAINE HCL 1 % IJ SOLN
INTRAMUSCULAR | Status: AC
  Administered 2016-04-16: 20 mL
  Filled 2016-04-16: qty 20

## 2016-04-16 MED ORDER — AZITHROMYCIN 250 MG PO TABS
1000.0000 mg | ORAL_TABLET | Freq: Once | ORAL | Status: AC
  Administered 2016-04-16: 1000 mg via ORAL
  Filled 2016-04-16: qty 4

## 2016-04-16 MED ORDER — BENZONATATE 100 MG PO CAPS: 100.0000 mg | ORAL_CAPSULE | Freq: Three times a day (TID) | ORAL | 0 refills | Status: DC

## 2016-04-16 MED ORDER — METRONIDAZOLE 500 MG PO TABS: 500.0000 mg | ORAL_TABLET | Freq: Two times a day (BID) | ORAL | 0 refills | Status: DC

## 2016-04-16 MED ORDER — CEFTRIAXONE SODIUM 250 MG IJ SOLR
250.0000 mg | Freq: Once | INTRAMUSCULAR | Status: AC
  Administered 2016-04-16: 250 mg via INTRAMUSCULAR
  Filled 2016-04-16: qty 250

## 2016-04-16 MED ORDER — GUAIFENESIN 100 MG/5ML PO SYRP: 100.0000 mg | ORAL_SOLUTION | ORAL | 0 refills | Status: DC | PRN

## 2016-04-16 NOTE — ED Provider Notes (Signed)
[facility]-EMERGENCY DEPT Provider Note   CSN: [id number] Arrival date & time: 04/16/16  1512     History   Chief Complaint Chief Complaint  Patient presents with  . vaginal pressure  . Abdominal Pain  . Cough  . Sore Throat  . Medication Refill  . Vaginal Discharge    HPI Heidi Estes is a 20 y.o. female.  HPI Heidi Estes is a 20 y.o. female who presents to emergency department multiple complaints. Patient states that she is here for upper respiratory type symptoms. She states she has chronic cough which she has had for a year, but states in the last several days her cough has worsened, now is productive with green sputum. She also reports nasal congestion and sore throat. She states her son is sick with similar symptoms. Patient is also complaining of bumps to her vaginal area. She states that several months ago she went to health department and there she was told this could be herpes. She was placed on Valtrex. She states Valtrex has not helped. She states the bumps are itchy, sometimes painful. She is also complaining of brownish vaginal discharge. Discharge for several months. Denies pregnancy. Reports some abdominal discomfort. No treatment prior to coming in.  Past Medical History:  Diagnosis Date  . Multiple allergies   . Postpartum depression     Patient Active Problem List   Diagnosis Date Noted  . Post term pregnancy, 41 weeks 03/04/2015  . 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 WISDOM TOOTH EXTRACTION Bilateral 07/2013   x4    OB History    Gravida Para Term Preterm AB Living   2 2 2  0   2   SAB TAB Ectopic Multiple Live Births         0 2       Home Medications    Prior to Admission medications   Medication Sig Start Date End Date Taking? Authorizing Provider  EPINEPHrine (EPIPEN 2-PAK IJ) Inject 1 applicator as directed once as needed (emergency medication).   Yes  Historical Provider, MD  valACYclovir (VALTREX) 500 MG tablet Take 1 tablet (500 mg total) by mouth 2 (two) times daily. Patient taking differently: Take 500 mg by mouth 2 (two) times daily as needed (cold sore flare ups).  02/08/15  Yes Marquette SaaAbigail Joseph Lancaster, MD  ondansetron (ZOFRAN-ODT) 4 MG disintegrating tablet Take 1 tablet (4 mg total) by mouth every 8 (eight) hours as needed for nausea or vomiting. Patient not taking: Reported on 04/16/2016 03/01/16   Benjiman CoreNathan Pickering, MD    Family History Family History  Problem Relation Age of Onset  . Asthma Mother   . Hypertension Mother   . Hypertension Maternal Aunt     Social History Social History  Substance Use Topics  . Smoking status: Never Smoker  . Smokeless tobacco: Never Used  . Alcohol use No     Comment: social     Allergies   Macrobid [nitrofurantoin monohyd macro]   Review of Systems Review of Systems  Constitutional: Negative for chills and fever.  HENT: Positive for congestion and sore throat.   Respiratory: Positive for cough. Negative for chest tightness and shortness of breath.   Cardiovascular: Negative for chest pain, palpitations and leg swelling.  Gastrointestinal: Positive for abdominal pain. Negative for diarrhea, nausea and vomiting.  Genitourinary: Positive for pelvic pain and vaginal discharge. Negative for dysuria, flank pain, vaginal bleeding  and vaginal pain.  Musculoskeletal: Negative for arthralgias, myalgias, neck pain and neck stiffness.  Skin: Negative for rash.  Neurological: Negative for dizziness, weakness and headaches.  All other systems reviewed and are negative.    Physical Exam Updated Vital Signs BP 121/87 (BP Location: Left Arm)   Pulse 79   Temp 98.3 F (36.8 C) (Oral)   Resp 18   LMP 02/20/2016   SpO2 100%   Physical Exam  Constitutional: She appears well-developed and well-nourished. No distress.  HENT:  Head: Normocephalic.  Right Ear: Tympanic membrane, external  ear and ear canal normal.  Left Ear: Tympanic membrane, external ear and ear canal normal.  Nose: Mucosal edema and rhinorrhea present.  Mouth/Throat: Uvula is midline, oropharynx is clear and moist and mucous membranes are normal. No oropharyngeal exudate or tonsillar abscesses.  Eyes: Conjunctivae are normal.  Neck: Neck supple.  Cardiovascular: Normal rate, regular rhythm and normal heart sounds.   Pulmonary/Chest: Effort normal and breath sounds normal. No respiratory distress. She has no wheezes. She has no rales.  Abdominal: Soft. Bowel sounds are normal. She exhibits no distension. There is no tenderness. There is no rebound.  Genitourinary:  Genitourinary Comments: Multiple raised whiteish/ skin colored papules to labia majora and perineum. No vesicles or ulcerations.. Normal vaginal canal. Small thin white discharge. Cervix is normal, closed. No CMT. No uterine or adnexal tenderness. No masses palpated.    Musculoskeletal: She exhibits no edema.  Neurological: She is alert.  Skin: Skin is warm and dry.  Psychiatric: She has a normal mood and affect. Her behavior is normal.  Nursing note and vitals reviewed.    ED Treatments / Results  Labs (all labs ordered are listed, but only abnormal results are displayed) Labs Reviewed  URINALYSIS, ROUTINE W REFLEX MICROSCOPIC (NOT AT [facility]) - Abnormal; Notable for the following:       Result Value   APPearance CLOUDY (*)    Specific Gravity, Urine 1.034 (*)    Protein, ur 30 (*)    Leukocytes, UA TRACE (*)    All other components within normal limits  URINE MICROSCOPIC-ADD ON - Abnormal; Notable for the following:    Squamous Epithelial / LPF 6-30 (*)    All other components within normal limits  WET PREP, GENITAL  HSV CULTURE AND TYPING  PREGNANCY, URINE  POC URINE PREG, ED  GC/CHLAMYDIA PROBE AMP ([place]) NOT AT [facility]    EKG  EKG Interpretation None       Radiology Dg Chest 2 View  Result Date:  04/16/2016 CLINICAL DATA:  Dyspnea, sore throat and sinus congestion x2 days EXAM: CHEST  2 VIEW COMPARISON:  None. FINDINGS: The heart size and mediastinal contours are within normal limits. Both lungs are clear. The visualized skeletal structures are unremarkable. IMPRESSION: No active cardiopulmonary disease. Electronically Signed   By: Tollie Ethavid  Kwon M.D.   On: 04/16/2016 17:18    Procedures Procedures (including critical care time)  Medications Ordered in ED Medications - No data to display   Initial Impression / Assessment and Plan / ED Course  I have reviewed the triage vital signs and the nursing notes.  Pertinent labs & imaging results that were available during my care of the patient were reviewed by me and considered in my medical decision making (see chart for details).  Clinical Course     Patient in emergency department with cough, URI symptoms, vaginal complaints. Vitals are normal. Lungs are clear. Chest x-ray ordered. Her pelvic exam  shows multiple "whitish colored" bumps to the labia majora. They're nontender. No vesicular. Doubt herpetic lesions, however they were swabbed. Will have her follow-up with the health department or her primary care doctor. The exam showed a white vaginal discharge with many white blood cells on wet prep, will treat with 250 mg of Rocephin and 1 g of Zithromax for cervicitis. Will cover with Flagyl for bacterial vaginosis.  Chest x-ray negative. Most likely viral URI, will treat symptomatically. Vital signs are normal, patient stable for discharge home with outpatient follow-up.  Final Clinical Impressions(s) / ED Diagnoses   Final diagnoses:  Upper respiratory tract infection, unspecified type  Bacterial vaginosis    New Prescriptions New Prescriptions   BENZONATATE (TESSALON) 100 MG CAPSULE    Take 1 capsule (100 mg total) by mouth every 8 (eight) hours.   GUAIFENESIN (ROBITUSSIN) 100 MG/5ML SYRUP    Take 5-10 mLs (100-200 mg total) by  mouth every 4 (four) hours as needed for cough.   METRONIDAZOLE (FLAGYL) 500 MG TABLET    Take 1 tablet (500 mg total) by mouth 2 (two) times daily.     Jaynie Crumble, PA-C 04/16/16 1901    Vanetta Mulders, MD 04/18/16 [phone]

## 2016-04-16 NOTE — Discharge Instructions (Signed)
Take cough medications as prescribed for cough. Drink plenty of fluids. Rest. Most likely a viral cough and will improve on its own. Follow up with family doctor. You were treated today for possible clamydia/gonorrhea. Tests are pending and you will be notified in 3 days if return abnormal.  Take flagyl as prescribed until all gone for bacterial vaginosis.

## 2016-04-16 NOTE — ED Notes (Signed)
Patient transported to X-ray 

## 2016-04-16 NOTE — ED Triage Notes (Addendum)
Patient c/o vaginal pressure, abd pain, cough, sore throat sinus congestion x couple days.  Patient needs Valtrex prescription refill due to her PCP office closed.  Patient adds that she has brown vaginal discharge with yeast like odor that she would like addressed today.

## 2016-04-17 LAB — GC/CHLAMYDIA PROBE AMP (~~LOC~~) NOT AT ARMC
CHLAMYDIA, DNA PROBE: NEGATIVE
NEISSERIA GONORRHEA: NEGATIVE

## 2016-04-18 LAB — HSV CULTURE AND TYPING

## 2016-10-30 ENCOUNTER — Emergency Department (HOSPITAL_COMMUNITY)
Admission: EM | Admit: 2016-10-30 | Discharge: 2016-10-31 | Disposition: A | Discharge status: Home / Self Care | Payer: BLUE CROSS/BLUE SHIELD | Attending: Emergency Medicine | Admitting: Emergency Medicine

## 2016-10-30 ENCOUNTER — Encounter (HOSPITAL_COMMUNITY): Payer: Self-pay | Admitting: Emergency Medicine

## 2016-10-30 DIAGNOSIS — N898 Other specified noninflammatory disorders of vagina: Secondary | ICD-10-CM | POA: Diagnosis not present

## 2016-10-30 DIAGNOSIS — R102 Pelvic and perineal pain: Secondary | ICD-10-CM | POA: Diagnosis not present

## 2016-10-30 DIAGNOSIS — N941 Unspecified dyspareunia: Secondary | ICD-10-CM | POA: Diagnosis not present

## 2016-10-30 DIAGNOSIS — R112 Nausea with vomiting, unspecified: Secondary | ICD-10-CM | POA: Diagnosis present

## 2016-10-30 LAB — URINALYSIS, ROUTINE W REFLEX MICROSCOPIC
BACTERIA UA: NONE SEEN
BILIRUBIN URINE: NEGATIVE
Glucose, UA: NEGATIVE mg/dL
HGB URINE DIPSTICK: NEGATIVE
KETONES UR: 5 mg/dL — AB
NITRITE: NEGATIVE
PH: 5 (ref 5.0–8.0)
Protein, ur: NEGATIVE mg/dL
Specific Gravity, Urine: 1.03 (ref 1.005–1.030)

## 2016-10-30 LAB — PREGNANCY, URINE: PREG TEST UR: NEGATIVE

## 2016-10-30 LAB — WET PREP, GENITAL
Clue Cells Wet Prep HPF POC: NONE SEEN
SPERM: NONE SEEN
TRICH WET PREP: NONE SEEN
YEAST WET PREP: NONE SEEN

## 2016-10-30 MED ORDER — ONDANSETRON 4 MG PO TBDP: 4.0000 mg | ORAL_TABLET | Freq: Three times a day (TID) | ORAL | 0 refills | Status: DC | PRN

## 2016-10-30 MED ORDER — LIDOCAINE HCL 2 % IJ SOLN
INTRAMUSCULAR | Status: AC
  Administered 2016-10-31: 1.5 mL
  Filled 2016-10-30: qty 20

## 2016-10-30 MED ORDER — AZITHROMYCIN 1 G PO PACK
1.0000 g | PACK | Freq: Once | ORAL | Status: AC
  Administered 2016-10-31: 1 g via ORAL
  Filled 2016-10-30: qty 1

## 2016-10-30 MED ORDER — CEFTRIAXONE SODIUM 250 MG IJ SOLR
250.0000 mg | Freq: Once | INTRAMUSCULAR | Status: AC
  Administered 2016-10-31: 250 mg via INTRAMUSCULAR
  Filled 2016-10-30: qty 250

## 2016-10-30 NOTE — ED Provider Notes (Signed)
[facility]EMERGENCY DEPT Provider Note   CSN: [id number] Arrival date & time: 10/30/16  1918     History   Chief Complaint Chief Complaint  Patient presents with  . Emesis  . Vaginal Discharge    HPI Heidi Estes is a 21 y.o. female G2 P28 presenting with 3 months of intermittent pelvic discomfort with nausea and one episode of vomiting today. She also reports dyspareunia and post coital bleeding for the last week and a half. She also endorses dark vaginal discharge. She reports having an IUD in place and not using condoms. This has been causing her to have a headache that is not new in character/intensity to her.  She denies urinary symptoms, fever, chills, abdominal pain. LMP was 10/18/16.   HPI  Past Medical History:  Diagnosis Date  . Multiple allergies   . Postpartum depression     Patient Active Problem List   Diagnosis Date Noted  . Post term pregnancy, 41 weeks 03/04/2015  . 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 WISDOM TOOTH EXTRACTION Bilateral 07/2013   x4    OB History    Gravida Para Term Preterm AB Living   2 2 2  0   2   SAB TAB Ectopic Multiple Live Births         0 2       Home Medications    Prior to Admission medications   Medication Sig Start Date End Date Taking? Authorizing Provider  EPINEPHrine (EPIPEN 2-PAK IJ) Inject 1 applicator as directed once as needed (emergency medication).   Yes [provider]  benzonatate (TESSALON) 100 MG capsule Take 1 capsule (100 mg total) by mouth every 8 (eight) hours. Patient not taking: Reported on 10/30/2016 04/16/16   Jaynie Crumble, PA-C  guaifenesin (ROBITUSSIN) 100 MG/5ML syrup Take 5-10 mLs (100-200 mg total) by mouth every 4 (four) hours as needed for cough. Patient not taking: Reported on 10/30/2016 04/16/16   Jaynie Crumble, PA-C  metroNIDAZOLE (FLAGYL) 500 MG tablet Take 1 tablet (500 mg total) by mouth  2 (two) times daily. Patient not taking: Reported on 10/30/2016 04/16/16   Jaynie Crumble, PA-C  ondansetron (ZOFRAN ODT) 4 MG disintegrating tablet Take 1 tablet (4 mg total) by mouth every 8 (eight) hours as needed for nausea or vomiting. 10/30/16   Georgiana Shore, PA-C  valACYclovir (VALTREX) 500 MG tablet Take 1 tablet (500 mg total) by mouth 2 (two) times daily. Patient not taking: Reported on 10/30/2016 02/08/15   Marquette Saa, MD    Family History Family History  Problem Relation Age of Onset  . Asthma Mother   . Hypertension Mother   . Hypertension Maternal Aunt     Social History Social History  Substance Use Topics  . Smoking status: Never Smoker  . Smokeless tobacco: Never Used  . Alcohol use No     Comment: social     Allergies   Macrobid [nitrofurantoin monohyd macro]   Review of Systems Review of Systems  Constitutional: Negative for chills and fever.  HENT: Negative for sore throat.   Respiratory: Negative for cough, shortness of breath, wheezing and stridor.   Cardiovascular: Negative for chest pain, palpitations and leg swelling.  Gastrointestinal: Positive for nausea and vomiting. Negative for abdominal distention, abdominal pain and blood in stool.  Genitourinary: Positive for dyspareunia, pelvic pain, vaginal discharge and vaginal pain. Negative for difficulty urinating, dysuria, flank pain, frequency,  hematuria, menstrual problem and urgency.  Musculoskeletal: Negative for arthralgias, back pain, myalgias, neck pain and neck stiffness.  Skin: Negative for color change, pallor and rash.  Neurological: Positive for headaches. Negative for seizures, syncope, facial asymmetry and weakness.     Physical Exam Updated Vital Signs BP 123/90 (BP Location: Left Arm)   Pulse 78   Temp 98.1 F (36.7 C) (Oral)   Resp 18   SpO2 100%   Physical Exam  Constitutional: She appears well-developed and well-nourished. No distress.  Patient is  afebrile, nontoxic-appearing, sitting comfortably in bed in no acute distress  HENT:  Head: Normocephalic and atraumatic.  Eyes: EOM are normal.  Neck: Normal range of motion.  Cardiovascular: Normal rate, regular rhythm and normal heart sounds.   No murmur heard. Pulmonary/Chest: Effort normal and breath sounds normal. No respiratory distress. She has no wheezes.  Abdominal: Soft. She exhibits no distension and no mass. There is no tenderness. There is no rebound and no guarding.  Patient denies any tenderness at this time and doesn't have any pelvic tenderness to palpation. No CVA tenderness  Genitourinary:  Genitourinary Comments: Abundant yellow cervical discharge. IUD string visualized. No cervical motion tenderness, adnexal mass or tenderness to palpation.  Musculoskeletal: She exhibits no edema.  Neurological: She is alert.  Skin: Skin is warm and dry. No rash noted. She is not diaphoretic. No erythema. No pallor.  Psychiatric: She has a normal mood and affect.  Nursing note and vitals reviewed.    ED Treatments / Results  Labs (all labs ordered are listed, but only abnormal results are displayed) Labs Reviewed  WET PREP, GENITAL - Abnormal; Notable for the following:       Result Value   WBC, Wet Prep HPF POC MODERATE (*)    All other components within normal limits  URINALYSIS, ROUTINE W REFLEX MICROSCOPIC - Abnormal; Notable for the following:    APPearance HAZY (*)    Ketones, ur 5 (*)    Leukocytes, UA SMALL (*)    Squamous Epithelial / LPF 6-30 (*)    All other components within normal limits  PREGNANCY, URINE  RPR  HIV ANTIBODY (ROUTINE TESTING)  POC URINE PREG, ED  GC/[facility] AMP [facility]    EKG  EKG Interpretation None       Radiology No results found.  Procedures Procedures (including critical care time)  Medications Ordered in ED Medications  cefTRIAXone (ROCEPHIN) injection 250 mg (250 mg Intramuscular Given 10/31/16  0007)  azithromycin (ZITHROMAX) powder 1 g (1 g Oral Given 10/31/16 0006)  lidocaine (XYLOCAINE) 2 % (with pres) injection (1.5 mLs  Given 10/31/16 0015)     Initial Impression / Assessment and Plan / ED Course  I have reviewed the triage vital signs and the nursing notes.  Pertinent labs & imaging results that were available during my care of the patient were reviewed by me and considered in my medical decision making (see chart for details).     Patient presents with intermittent bilateral pelvic discomfort for months On exam, copious cervical discharge noted, IUD string visualized, no cervical motion tenderness adnexal mass or tenderness.  Lower suspicion for PID or ovarian torsion.   Pregnancy negative.  Will treat in ED with ceftriaxone and azithromycin.  Discharge home with close follow up with GYN. Advised patient to refrain from intercourse for at least a week.  Discussed strict return precautions and advised to return to the emergency department if experiencing any new  or worsening symptoms. Instructions were understood and patient agreed with discharge plan.  Final Clinical Impressions(s) / ED Diagnoses   Final diagnoses:  Vaginal discharge  Pelvic pain    New Prescriptions Discharge Medication List as of 10/30/2016 11:37 PM       Georgiana Shore, PA-C 10/31/16 6644    Shaune Pollack, MD 10/31/16 [phone]

## 2016-10-30 NOTE — Discharge Instructions (Signed)
As discussed, follow up with the women's clinic. Refrain from intercourse for at least one week and until you are no longer experiencing pain.  Return if you experience any worsening pain, fever, chills, nausea, vomiting or other concerning symptoms in the meantime.

## 2016-10-30 NOTE — ED Triage Notes (Signed)
Pt is c/o nausea that she has had for a few months and states she had vomiting today  Pt states she has lower abd pain "for a while"  Pt states she has had headaches off and on now for the past 3 months  Pt also states she has brownish vaginal discharge

## 2016-10-31 DIAGNOSIS — N898 Other specified noninflammatory disorders of vagina: Secondary | ICD-10-CM | POA: Diagnosis not present

## 2016-10-31 LAB — HIV ANTIBODY (ROUTINE TESTING W REFLEX): HIV Screen 4th Generation wRfx: NONREACTIVE

## 2016-11-01 LAB — RPR, QUANT+TP ABS (REFLEX)
Rapid Plasma Reagin, Quant: 1:1 {titer} — ABNORMAL HIGH
T Pallidum Abs: POSITIVE — AB

## 2016-11-01 LAB — GC/CHLAMYDIA PROBE AMP (~~LOC~~) NOT AT ARMC
Chlamydia: NEGATIVE
NEISSERIA GONORRHEA: NEGATIVE

## 2016-11-01 LAB — RPR: RPR: REACTIVE — AB

## 2016-11-10 ENCOUNTER — Emergency Department (HOSPITAL_COMMUNITY)
Admission: EM | Admit: 2016-11-10 | Discharge: 2016-11-10 | Disposition: A | Discharge status: Home / Self Care | Payer: BLUE CROSS/BLUE SHIELD | Attending: Emergency Medicine | Admitting: Emergency Medicine

## 2016-11-10 ENCOUNTER — Encounter (HOSPITAL_COMMUNITY): Payer: Self-pay | Admitting: Emergency Medicine

## 2016-11-10 DIAGNOSIS — Z79899 Other long term (current) drug therapy: Secondary | ICD-10-CM | POA: Diagnosis not present

## 2016-11-10 DIAGNOSIS — N898 Other specified noninflammatory disorders of vagina: Secondary | ICD-10-CM | POA: Diagnosis present

## 2016-11-10 DIAGNOSIS — A539 Syphilis, unspecified: Secondary | ICD-10-CM | POA: Diagnosis not present

## 2016-11-10 LAB — WET PREP, GENITAL
CLUE CELLS WET PREP: NONE SEEN
SPERM: NONE SEEN
TRICH WET PREP: NONE SEEN
YEAST WET PREP: NONE SEEN

## 2016-11-10 MED ORDER — PENICILLIN G BENZATHINE 1200000 UNIT/2ML IM SUSP
2.4000 10*6.[IU] | Freq: Once | INTRAMUSCULAR | Status: AC
Start: 2016-11-10 — End: 2016-11-10
  Administered 2016-11-10: 2.4 10*6.[IU] via INTRAMUSCULAR
  Filled 2016-11-10: qty 4

## 2016-11-10 NOTE — ED Triage Notes (Signed)
Pt reports she got a phone call that she tested positive for syphilis. Also wants to be rechecked for stds. Has grey vaginal discharge and back discomfort.

## 2016-11-10 NOTE — Discharge Instructions (Signed)
Please read the instructions below.  You have been treated for syphilis today. Talk with your primary care provider about any new medications.  Please schedule an appointment for follow up with the GYN specialist.  It is important that you notify all sexual partners of your positive test results, and refrain from sexual intercourse for at least 1 week, and until your receive your pending test results. You will receive a call from the hospital if your test results come back positive. If your results come back positive, it is important that you inform all of your sexual partners. Return to the ER for new or worsening symptoms.

## 2016-11-11 LAB — HIV ANTIBODY (ROUTINE TESTING W REFLEX): HIV Screen 4th Generation wRfx: NONREACTIVE

## 2016-11-11 LAB — GC/CHLAMYDIA PROBE AMP (~~LOC~~) NOT AT ARMC
Chlamydia: NEGATIVE
Neisseria Gonorrhea: NEGATIVE

## 2016-11-11 NOTE — ED Provider Notes (Signed)
[facility] DE[facility] Provider Note   CSN: [id number] Arrival date & time: 11/10/16  1454     History   Chief Complaint Chief Complaint  Patient presents with  . Vaginal Discharge    HPI [name] is a 21 y.o. female.  Patient presents to ED for positive syphilis test. Patient was seen on 10/30/2016 for intermittent pelvic discomfort. Patient noted have copious cervical discharge, treated with azithromycin and Rocephin. GC/chlamydia cultures as well as HIV and RPR labs pending upon discharge. RPR resulted as reactive. GC/Chlamydia cultures and HIV resulted as negative. Patient states that she had engaged in sexual intercourse with the same partner before hearing of her lab results. 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 She requests repeat STD check today, as she is unsure if he has given her other STDs since her previous visit. She denies any new or worsening symptoms from previous ED visit. Requests repeat HIV and gonorrhea/chlamydia tests.      Past Medical History:  Diagnosis Date  . Multiple allergies   . Postpartum depression     Patient Active Problem List   Diagnosis Date Noted  . Post term pregnancy, 41 weeks 03/04/2015  . 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 WISDOM TOOTH EXTRACTION Bilateral 07/2013   x4    OB History    Gravida Para Term Preterm AB Living   2 2 2  0   2   SAB TAB Ectopic Multiple Live Births         0 2       Home Medications    Prior to Admission medications   Medication Sig Start Date End Date Taking? Authorizing Provider  Multiple Vitamin (MULTIVITAMIN WITH MINERALS) TABS tablet Take 1 tablet by mouth daily.   Yes [provider]  PARAGARD INTRAUTERINE COPPER IUD IUD 1 each by Intrauterine route once.   Yes [provider]  benzonatate (TESSALON) 100 MG capsule Take 1 capsule (100 mg total) by  mouth every 8 (eight) hours. Patient not taking: Reported on 10/30/2016 04/16/16   Jaynie CrumbleKirichenko, Tatyana, PA-C  EPINEPHrine (EPIPEN 2-PAK IJ) Inject 1 applicator as directed once as needed (emergency medication).    [provider]  guaifenesin (ROBITUSSIN) 100 MG/5ML syrup Take 5-10 mLs (100-200 mg total) by mouth every 4 (four) hours as needed for cough. Patient not taking: Reported on 10/30/2016 04/16/16   Jaynie CrumbleKirichenko, Tatyana, PA-C  metroNIDAZOLE (FLAGYL) 500 MG tablet Take 1 tablet (500 mg total) by mouth 2 (two) times daily. Patient not taking: Reported on 10/30/2016 04/16/16   Jaynie CrumbleKirichenko, Tatyana, PA-C  ondansetron (ZOFRAN ODT) 4 MG disintegrating tablet Take 1 tablet (4 mg total) by mouth every 8 (eight) hours as needed for nausea or vomiting. Patient not taking: Reported on 11/10/2016 10/30/16   Georgiana ShoreMitchell, Jessica B, PA-C  valACYclovir (VALTREX) 500 MG tablet Take 1 tablet (500 mg total) by mouth 2 (two) times daily. Patient not taking: Reported on 10/30/2016 02/08/15   Marquette SaaLancaster, Abigail Joseph, MD    Family History Family History  Problem Relation Age of Onset  . Asthma Mother   . Hypertension Mother   . Hypertension Maternal Aunt     Social History Social History  Substance Use Topics  . Smoking status: Never Smoker  . Smokeless tobacco: Never Used  . Alcohol use No     Comment: social     Allergies   Macrobid [  nitrofurantoin monohyd macro]   Review of Systems Review of Systems  Constitutional: Negative for fever.  Gastrointestinal: Negative for nausea and vomiting.  Genitourinary: Positive for vaginal discharge. Negative for dysuria and frequency.  Skin: Negative for rash.     Physical Exam Updated Vital Signs BP 122/76 (BP Location: Right Arm)   Pulse 64   Temp 98.7 F (37.1 C) (Oral)   Resp 16   SpO2 100%   Physical Exam  Constitutional: She appears well-developed and well-nourished.  HENT:  Head: Normocephalic and atraumatic.  Eyes: Conjunctivae  are normal.  Cardiovascular: Normal rate.   Pulmonary/Chest: Effort normal.  Abdominal: Soft. Bowel sounds are normal. She exhibits no distension. There is no tenderness. There is no rebound and no guarding.  Genitourinary: Uterus normal. There is no tenderness on the right labia. There is no tenderness on the left labia. Cervix exhibits discharge. Cervix exhibits no motion tenderness. Right adnexum displays no mass and no tenderness. Left adnexum displays no mass and no tenderness. No tenderness in the vagina. Vaginal discharge found.  Genitourinary Comments: Exam performed with chaperone present. Moderate amount of white cervical discharge. IUD strings visualized at os.  Psychiatric: She has a normal mood and affect. Her behavior is normal.  Nursing note and vitals reviewed.    ED Treatments / Results  Labs (all labs ordered are listed, but only abnormal results are displayed) Labs Reviewed  WET PREP, GENITAL - Abnormal; Notable for the following:       Result Value   WBC, Wet Prep HPF POC MANY (*)    All other components within normal limits  HIV ANTIBODY (ROUTINE TESTING)  GC/CHLAMYDIA PROBE AMP ([place]) NOT AT [facility]    EKG  EKG Interpretation None       Radiology No results found.  Procedures Procedures (including critical care time)  Medications Ordered in ED Medications  penicillin g benzathine (BICILLIN LA) 1200000 UNIT/2ML injection 2.4 Million Units (2.4 Million Units Intramuscular Given 11/10/16 1658)     Initial Impression / Assessment and Plan / ED Course  I have reviewed the triage vital signs and the nursing notes.  Pertinent labs & imaging results that were available during my care of the patient were reviewed by me and considered in my medical decision making (see chart for details).     Patient with reactive RPR from previous ED visit. Requesting repeat STD screening. Pelvic exam with cervical discharge, however no CMT or adnexal tenderness. 2.4  million units of Penicillin G even in ED for treatment of syphilis. GC/chlamydia cultures and HIV antibody tests pending prior to discharge. Strongly recommended patient to avoid sexual activity until she receives her test results. Strongly recommend patient report to the health department. Referral to the women's clinic given, and encouraged follow-up. She safe for discharge home.  Patient discussed with Dr. Freida Busman.  Discussed results, findings, treatment and follow up. Patient advised of return precautions. Patient verbalized understanding and agreed with plan.   Final Clinical Impressions(s) / ED Diagnoses   Final diagnoses:  Syphilis    New Prescriptions Discharge Medication List as of 11/10/2016  5:47 PM       Russo, [place] N, PA-C 11/11/16 0030    Russo, [place] N, PA-C 11/11/16 0030    Lorre Nick, MD 11/12/16 1336

## 2016-12-17 ENCOUNTER — Emergency Department (HOSPITAL_COMMUNITY)
Admission: EM | Admit: 2016-12-17 | Discharge: 2016-12-17 | Disposition: A | Discharge status: Home / Self Care | Payer: BLUE CROSS/BLUE SHIELD

## 2016-12-22 ENCOUNTER — Encounter (HOSPITAL_COMMUNITY): Payer: Self-pay | Admitting: Emergency Medicine

## 2016-12-22 ENCOUNTER — Emergency Department (HOSPITAL_COMMUNITY)
Admission: EM | Admit: 2016-12-22 | Discharge: 2016-12-22 | Disposition: A | Discharge status: Home / Self Care | Payer: BLUE CROSS/BLUE SHIELD | Attending: Emergency Medicine | Admitting: Emergency Medicine

## 2016-12-22 DIAGNOSIS — Z202 Contact with and (suspected) exposure to infections with a predominantly sexual mode of transmission: Secondary | ICD-10-CM | POA: Diagnosis not present

## 2016-12-22 DIAGNOSIS — A539 Syphilis, unspecified: Secondary | ICD-10-CM | POA: Diagnosis not present

## 2016-12-22 LAB — WET PREP, GENITAL
CLUE CELLS WET PREP: NONE SEEN
Sperm: NONE SEEN
TRICH WET PREP: NONE SEEN
YEAST WET PREP: NONE SEEN

## 2016-12-22 MED ORDER — CEFTRIAXONE SODIUM 250 MG IJ SOLR
250.0000 mg | Freq: Once | INTRAMUSCULAR | Status: AC
  Administered 2016-12-22: 250 mg via INTRAMUSCULAR
  Filled 2016-12-22: qty 250

## 2016-12-22 MED ORDER — STERILE WATER FOR INJECTION IJ SOLN
INTRAMUSCULAR | Status: AC
  Administered 2016-12-22: 10 mL
  Filled 2016-12-22: qty 10

## 2016-12-22 MED ORDER — PENICILLIN G BENZATHINE 1200000 UNIT/2ML IM SUSP
2.4000 10*6.[IU] | Freq: Once | INTRAMUSCULAR | Status: AC
  Administered 2016-12-22: 2.4 10*6.[IU] via INTRAMUSCULAR
  Filled 2016-12-22: qty 4

## 2016-12-22 MED ORDER — AZITHROMYCIN 250 MG PO TABS
1000.0000 mg | ORAL_TABLET | Freq: Once | ORAL | Status: AC
  Administered 2016-12-22: 1000 mg via ORAL
  Filled 2016-12-22: qty 4

## 2016-12-22 NOTE — Discharge Instructions (Signed)
Use a condom with every sexual encounter Follow up with your doctor/OBGYN or the health department in regards to today's visit.   Please return to the ER for worsening symptoms, high fevers or persistent vomiting.

## 2016-12-22 NOTE — ED Provider Notes (Signed)
[facility] Provider Note   CSN: [id number] Arrival date & time: 12/22/16  4098     History   Chief Complaint Chief Complaint  Patient presents with  . SEXUALLY TRANSMITTED DISEASE    HPI ADEJA SARRATT is a 21 y.o. female.  The history is provided by the patient and medical records. No language interpreter was used.   JIMMA ORTMAN is a 21 y.o. female who presents to the Emergency Department after having a positive syphilis test at outside facility. Patient was seen in ER on 6/24 where she was treated with bicillin for positive syphilis test. Patient states that since that time, she had sexual intercourse with a new partner who was called and told he had positive syphilis test, therefore she went to health department to be checked. Her syphilis test again came back positive, therefore she was told to come to ER for treatment. She does also endorse a brown color discharge. Pelvic exam not performed at health department per patient, just blood work. No abdominal pain, fever or chills. She does endorse bumps to right axilla. No rash or lesions anywhere else, specifically no GU lesions. No dysuria, urinary urgency or frequency.   Past Medical History:  Diagnosis Date  . Multiple allergies   . Postpartum depression     Patient Active Problem List   Diagnosis Date Noted  . Post term pregnancy, 41 weeks 03/04/2015  . 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 WISDOM TOOTH EXTRACTION Bilateral 07/2013   x4    OB History    Gravida Para Term Preterm AB Living   2 2 2  0   2   SAB TAB Ectopic Multiple Live Births         0 2       Home Medications    Prior to Admission medications   Medication Sig Start Date End Date Taking? Authorizing Provider  Multiple Vitamin (MULTIVITAMIN WITH MINERALS) TABS tablet Take 1 tablet by mouth daily.   Yes [provider]  PARAGARD INTRAUTERINE  COPPER IUD IUD 1 each by Intrauterine route once.   Yes [provider]  benzonatate (TESSALON) 100 MG capsule Take 1 capsule (100 mg total) by mouth every 8 (eight) hours. Patient not taking: Reported on 10/30/2016 04/16/16   Jaynie Crumble, PA-C  guaifenesin (ROBITUSSIN) 100 MG/5ML syrup Take 5-10 mLs (100-200 mg total) by mouth every 4 (four) hours as needed for cough. Patient not taking: Reported on 10/30/2016 04/16/16   Jaynie Crumble, PA-C  metroNIDAZOLE (FLAGYL) 500 MG tablet Take 1 tablet (500 mg total) by mouth 2 (two) times daily. Patient not taking: Reported on 10/30/2016 04/16/16   Jaynie Crumble, PA-C  ondansetron (ZOFRAN ODT) 4 MG disintegrating tablet Take 1 tablet (4 mg total) by mouth every 8 (eight) hours as needed for nausea or vomiting. Patient not taking: Reported on 12/22/2016 10/30/16   Georgiana Shore, PA-C  valACYclovir (VALTREX) 500 MG tablet Take 1 tablet (500 mg total) by mouth 2 (two) times daily. Patient not taking: Reported on 10/30/2016 02/08/15   Marquette Saa, MD    Family History Family History  Problem Relation Age of Onset  . Asthma Mother   . Hypertension Mother   . Hypertension Maternal Aunt     Social History Social History  Substance Use Topics  . Smoking status: Never Smoker  . Smokeless tobacco: Never Used  . Alcohol use Yes  Comment: social     Allergies   Macrobid [nitrofurantoin monohyd macro]   Review of Systems Review of Systems  Genitourinary: Positive for vaginal discharge. Negative for dysuria, frequency, pelvic pain, urgency and vaginal pain.  All other systems reviewed and are negative.    Physical Exam Updated Vital Signs BP (!) 128/94 (BP Location: Left Arm)   Pulse 87   Temp 98.1 F (36.7 C) (Oral)   Resp 16   Ht 5\' 1"  (1.549 m)   Wt 87.1 kg (192 lb)   SpO2 100%   BMI 36.28 kg/m   Physical Exam  Constitutional: She is oriented to person, place, and time. She appears  well-developed and well-nourished. No distress.  HENT:  Head: Normocephalic and atraumatic.  Cardiovascular: Normal rate, regular rhythm and normal heart sounds.   No murmur heard. Pulmonary/Chest: Effort normal and breath sounds normal. No respiratory distress.  Abdominal: Soft. She exhibits no distension. There is no tenderness.  No abdominal or CVA tenderness.  Genitourinary:  Genitourinary Comments: Chaperone present for exam. Active menstrual bleeding. No discharge appreciated. No CMT. No adnexal masses, tenderness, or fullness.  Neurological: She is alert and oriented to person, place, and time.  Skin: Skin is warm and dry.  Nursing note and vitals reviewed.   ED Treatments / Results  Labs (all labs ordered are listed, but only abnormal results are displayed) Labs Reviewed  WET PREP, GENITAL - Abnormal; Notable for the following:       Result Value   WBC, Wet Prep HPF POC MANY (*)    All other components within normal limits  FLUORESCENT TREPONEMAL AB(FTA)-IGG-BLD  GC/CHLAMYDIA PROBE AMP ([place]) NOT AT [facility]    EKG  EKG Interpretation None       Radiology No results found.  Procedures Procedures (including critical care time)  Medications Ordered in ED Medications  cefTRIAXone (ROCEPHIN) injection 250 mg (not administered)  azithromycin (ZITHROMAX) tablet 1,000 mg (not administered)  sterile water (preservative free) injection (not administered)  penicillin g benzathine (BICILLIN LA) 1200000 UNIT/2ML injection 2.4 Million Units (2.4 Million Units Intramuscular Given 12/22/16 0824)     Initial Impression / Assessment and Plan / ED Course  I have reviewed the triage vital signs and the nursing notes.  Pertinent labs & imaging results that were available during my care of the patient were reviewed by me and considered in my medical decision making (see chart for details).    Jackelyn KnifeJustasia D Breck is a 21 y.o. female who [facility] with brown vaginal discharge. She was seen in ED on 6/24 where she received treatment following + syphilis test. Patient has had new partner whom also had a positive test since that treatment. She went to the health department last week and had + test there. Given new exposure and + test, will retreat. As she has now had two + RPR, FTA was sent. Pelvic with no cervical motion or adnexal tenderness. Wet prep with many WBC's. Also prophylactically treated with azithro / rocephin. Follow up with OBGYN or health department. Reasons to return to ER and safe sex practices discussed. All questions answered.   Patient discussed with Dr. Lynelle DoctorKnapp who agrees with treatment plan.    Final Clinical Impressions(s) / ED Diagnoses   Final diagnoses:  STD exposure    New Prescriptions New Prescriptions   No medications on file     Dicky Boer, Chase PicketJaime Pilcher, PA-C 12/22/16 0900  Linwood DibblesKnapp, Jon, MD 12/24/16 1028

## 2016-12-22 NOTE — ED Triage Notes (Signed)
Pt states she tested positive for syphilis and was told to come in for treatment  Pt states she has a lot of brown discharge and has a rash and itching to her area  Pt states when she was tested they did not do a pelvic exam only blood work  Pt states she has also had some abd discomfort

## 2016-12-23 LAB — GC/CHLAMYDIA PROBE AMP (~~LOC~~) NOT AT ARMC
Chlamydia: NEGATIVE
Neisseria Gonorrhea: NEGATIVE

## 2016-12-23 LAB — FLUORESCENT TREPONEMAL AB(FTA)-IGG-BLD: FLUORESCENT TREPONEMAL AB, IGG: REACTIVE — AB

## 2017-01-23 ENCOUNTER — Encounter (HOSPITAL_COMMUNITY): Payer: Self-pay | Admitting: Emergency Medicine

## 2017-01-23 ENCOUNTER — Emergency Department (HOSPITAL_COMMUNITY)
Admission: EM | Admit: 2017-01-23 | Discharge: 2017-01-23 | Disposition: A | Discharge status: Home / Self Care | Payer: BLUE CROSS/BLUE SHIELD | Attending: Emergency Medicine | Admitting: Emergency Medicine

## 2017-01-23 DIAGNOSIS — R21 Rash and other nonspecific skin eruption: Secondary | ICD-10-CM | POA: Diagnosis present

## 2017-01-23 DIAGNOSIS — Z8619 Personal history of other infectious and parasitic diseases: Secondary | ICD-10-CM | POA: Diagnosis not present

## 2017-01-23 DIAGNOSIS — M542 Cervicalgia: Secondary | ICD-10-CM

## 2017-01-23 LAB — URINALYSIS, ROUTINE W REFLEX MICROSCOPIC
BILIRUBIN URINE: NEGATIVE
GLUCOSE, UA: NEGATIVE mg/dL
KETONES UR: NEGATIVE mg/dL
LEUKOCYTES UA: NEGATIVE
Nitrite: NEGATIVE
PH: 7 (ref 5.0–8.0)
Protein, ur: NEGATIVE mg/dL
SPECIFIC GRAVITY, URINE: 1.023 (ref 1.005–1.030)

## 2017-01-23 LAB — PREGNANCY, URINE: PREG TEST UR: NEGATIVE

## 2017-01-23 MED ORDER — CYCLOBENZAPRINE HCL 10 MG PO TABS: 10.0000 mg | ORAL_TABLET | Freq: Two times a day (BID) | ORAL | 0 refills | Status: DC | PRN

## 2017-01-23 MED ORDER — PENICILLIN G BENZATHINE 1200000 UNIT/2ML IM SUSP
2.4000 10*6.[IU] | Freq: Once | INTRAMUSCULAR | Status: AC
  Administered 2017-01-23: 2.4 10*6.[IU] via INTRAMUSCULAR
  Filled 2017-01-23: qty 4

## 2017-01-23 MED ORDER — IBUPROFEN 600 MG PO TABS: 600.0000 mg | ORAL_TABLET | Freq: Four times a day (QID) | ORAL | 0 refills | Status: DC | PRN

## 2017-01-23 MED ORDER — TRIAMCINOLONE ACETONIDE 0.1 % EX CREA: 1.0000 "application " | TOPICAL_CREAM | Freq: Two times a day (BID) | CUTANEOUS | 0 refills | Status: DC

## 2017-01-23 NOTE — Discharge Instructions (Signed)
Please see the information and instructions below regarding your visit.  Your diagnoses today include:  1. Neck pain   2. Rash   3. History of positive serological reaction for syphilis     Tests performed today include: You were tested today for HIV. You will get a call for positive test results only and a request to come in to a clinic.  See side panel of your discharge paperwork for testing performed today. Vital signs are listed at the bottom of these instructions.   Medications prescribed:    You are prescribed ibuprofen, a non-steroidal anti-inflammatory agent (NSAID) for pain. You may take 600 mg every 6 hours as needed for pain. If still requiring this medication around the clock for acute pain after 10 days, please see your primary healthcare provider.  You may combine this medication with Tylenol, 650 mg every 6 hours, so you are receiving something for pain every 3 hours.  This is not a long-term medication unless under the care and direction of your primary provider. Taking this medication long-term and not under the supervision of a healthcare provider could increase the risk of stomach ulcers, kidney problems, and cardiovascular problems such as high blood pressure.    You are prescribed Flexeril, a muscle relaxant. Some common side effects of this medication include:  Feeling sleepy.  Dizziness. Take care upon going from a seated to a standing position.  Dry mouth.  Feeling tired or weak.  Hard stools (constipation).  Upset stomach. These are not all of the side effects that may occur. If you have questions about side effects, call your doctor. Call your primary care provider for medical advice about side effects.  This medication can be sedating. Only take this medication as needed. Please do not combine with alcohol. Do not drive or operate machinery while taking this medication.   This medication can interact with some other medications. Make sure to tell any  provider you are taking this medication before they prescribe you a new medication.    You're prescribed Kenalog, a steroid cream. Apply this 2 year rash twice daily for a week.   Take any prescribed medications only as prescribed, and any over the counter medications only as directed on the packaging.  Home care instructions:  Please follow any educational materials contained in this packet.   Please apply warm compresses to your neck for relief.  Follow-up instructions: Please follow-up with your primary care provider as needed for further evaluation of your symptoms if they are not completely improved.   Return instructions:  Please return to the Emergency Department if you experience worsening symptoms.  Please return to the Emergency Department for any increasing pain, changes in vision, weakness in your arms or numbness and tingling in your hands, new headache, or a dizzy sensation that does not resolve. Please return if you have any other emergent concerns.  Your vital signs today were: BP 123/83 (BP Location: Left Arm)    Pulse 83    Temp 99.6 F (37.6 C)    Resp 18    SpO2 100%  If your blood pressure (BP) was elevated on multiple readings during this visit above 130 for the top number or above 80 for the bottom number, please have this repeated by your primary care provider within one month. --------------  Thank you for allowing us to participate in your care today.

## 2017-01-23 NOTE — ED Provider Notes (Signed)
[facility]-EMERGENCY DEPT Provider Note   CSN: [id number] Arrival date & time: 01/23/17  1025     History   Chief Complaint Chief Complaint  Patient presents with  . Neck Pain  . Vaginal Discharge    HPI Heidi Estes is a 21 y.o. female.  HPI   Patient is a 21 year old female with a history of being diagnosed with syphilis 1 month ago presenting for concerns about STI re-exposure from the same partner. Additionally, patient woke up with some neck pain concerning to her today, and has had a rash on her right flank for 2 weeks. Patient had a positive RPR and ultimately FTA one month ago and was appropriately treated at that time. Patient reports that she is exposed to the same partner, and although they have both been treated she wished to be retested again today. Patient report some vaginal itching, but denies rash, dysuria, vaginal discharge. Patient does not recall any specific trauma or event relating to her neck pain. Patient reports she woke up approximately 8 out of 10 dull pain and bilateral paracervical musculature that is worse with movement. Patient has not tried anything to improve the pain. Patient reports some right chest discomfort when this neck pain began but denies any fever, chills, headache, vision changes, neck stiffness, vertigo or persistent dizziness, numbness, tingling, or weakness in the distal upper extremities. Patient reports that the rash in her right flank that has been present for approximately 2 weeks. Patient has not tried anything for this rash. Patient denies any history of dermatologic problems.   Past Medical History:  Diagnosis Date  . Multiple allergies   . Postpartum depression     Patient Active Problem List   Diagnosis Date Noted  . Post term pregnancy, 41 weeks 03/04/2015  . 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 WISDOM TOOTH EXTRACTION Bilateral 07/2013   x4     OB History    Gravida Para Term Preterm AB Living   0   2   SAB TAB Ectopic Multiple Live Births         0 2       Home Medications    Prior to Admission medications   Medication Sig Start Date End Date Taking? Authorizing Provider  Multiple Vitamin (MULTIVITAMIN WITH MINERALS) TABS tablet Take 1 tablet by mouth daily.   Yes [provider]  PARAGARD INTRAUTERINE COPPER IUD IUD 1 each by Intrauterine route once.   Yes [provider]  cyclobenzaprine (FLEXERIL) 10 MG tablet Take 1 tablet (10 mg total) by mouth 2 (two) times daily as needed for muscle spasms. 01/23/17   Renne Crigler, PA-C  ibuprofen (ADVIL,MOTRIN) 600 MG tablet Take 1 tablet (600 mg total) by mouth every 6 (six) hours as needed. 01/23/17   Renne Crigler, PA-C  triamcinolone cream (KENALOG) 0.1 % Apply 1 application topically 2 (two) times daily. 01/23/17   Renne Crigler, PA-C    Family History Family History  Problem Relation Age of Onset  . Asthma Mother   . Hypertension Mother   . Hypertension Maternal Aunt     Social History Social History  Substance Use Topics  . Smoking status: Never Smoker  . Smokeless tobacco: Never Used  . Alcohol use Yes     Comment: social     Allergies   Macrobid [nitrofurantoin monohyd macro]   Review of Systems Review of Systems  Constitutional:  Negative for chills and fever.  HENT: Negative for congestion, rhinorrhea, sinus pain and sore throat.   Eyes: Negative for visual disturbance.  Respiratory: Positive for chest tightness. Negative for cough and shortness of breath.   Cardiovascular: Negative for chest pain and leg swelling.  Gastrointestinal: Negative for abdominal pain, constipation, diarrhea, nausea and vomiting.  Genitourinary: Negative for dysuria and flank pain.  Musculoskeletal: Negative for back pain and myalgias.  Skin: Positive for rash.  Neurological: Negative for dizziness, syncope, weakness, light-headedness, numbness and  headaches.     Physical Exam Updated Vital Signs BP 122/80   Pulse 80   Temp 99.6 F (37.6 C)   Resp 16   SpO2 100%   Physical Exam  Constitutional: She appears well-developed and well-nourished. No distress.  HENT:  Head: Normocephalic and atraumatic.  Mouth/Throat: Oropharynx is clear and moist.  Eyes: Pupils are equal, round, and reactive to light. Conjunctivae and EOM are normal.  Neck: Normal range of motion. Neck supple.  Cardiovascular: Normal rate, regular rhythm, S1 normal, S2 normal and normal heart sounds.   No murmur heard. Pulmonary/Chest: Effort normal and breath sounds normal. She has no wheezes. She has no rales.  Abdominal: Soft. She exhibits no distension. There is no tenderness. There is no guarding.  Musculoskeletal: Normal range of motion. She exhibits no edema or deformity.  Lymphadenopathy:    She has no cervical adenopathy.  Neurological: She is alert.  Cranial nerves grossly intact. Strength 5/5 upper and lower extremities. Sensation to light touch intact in distal right upper extremities.  Skin: Skin is warm and dry. Rash noted. No erythema.  There is an area of xerosis and scaling in the right flank to right para spinal lumbar region. No erythema, vesicles, or raised plaques.  Psychiatric: She has a normal mood and affect. Her behavior is normal. Judgment and thought content normal.  Nursing note and vitals reviewed.    ED Treatments / Results  Labs (all labs ordered are listed, but only abnormal results are displayed) Labs Reviewed  URINALYSIS, ROUTINE W REFLEX MICROSCOPIC - Abnormal; Notable for the following:       Result Value   Hgb urine dipstick LARGE (*)    Bacteria, UA FEW (*)    Squamous Epithelial / LPF 0-5 (*)    All other components within normal limits  PREGNANCY, URINE  HIV ANTIBODY (ROUTINE TESTING)    EKG  EKG Interpretation None       Radiology No results found.  Procedures Procedures (including critical care  time)  Medications Ordered in ED Medications  penicillin g benzathine (BICILLIN LA) 1200000 UNIT/2ML injection 2.4 Million Units (2.4 Million Units Intramuscular Given 01/23/17 1557)     Initial Impression / Assessment and Plan / ED Course  I have reviewed the triage vital signs and the nursing notes.  Pertinent labs & imaging results that were available during my care of the patient were reviewed by me and considered in my medical decision making (see chart for details).  Clinical Course as of Jan 24 1655  Thu Jan 23, 2017  1522 Patient seen and evaluated. Patient declined pelvic exam, as she is menstruating and does not have symptoms of cervicitis. Will proceed with HIV and syphilis testing per pt wishes.   [AM]    Clinical Course User Index [AM] Elisha PonderMurray, Tayvien Kane B, PA-C    Final Clinical Impressions(s) / ED Diagnoses   Final diagnoses:  Neck pain  Rash  History of positive serological reaction for syphilis  MDM  Patient is a 21 year old female with a history of being diagnosed with syphilis 1 month ago presenting for concerns about STI re-exposure from the same partner with additional complaints for neck pain since this morning and a right flank rash. Patient was counseled that full STI testing is recommended if she thinks she has had an exposure, however patient deferred pelvic exam because she is menstruating and denies symptoms of vaginal discharge. Patient wished only for HIV and syphilis testing today. Due to patient's recent positive RPR/FTA one month ago, patient would likely still have a positive RPR. Patient was counseled on this, and elected to be treated empirically with benzathine penicillin.  Patient's neck pain and associated symptoms show no concerning features for trauma, cervical radiculopathy, meningitis, vertebral artery dissection. Patient is afebrile, well-appearing, neurologically intactand in no acute distress. The neck pain was resolving through the course of  the day, however patient still wished for evaluation. Patient discharged with prescriptions for ibuprofen and Flexeril symptomatically for possible muscle strain. Patient is an understanding and agrees with the plan of care.  Differential diagnosis for patient's rash include atopic dermatitis, psoriasis, contact dermatitis. Doubt shingles given the time course of the rash and lack of vesicular lesions. The rash shows no concerning features for expansion or infection. Patient will attempt Kenalog cream. Patient is in understanding and agrees with the plan of care.  New Prescriptions Discharge Medication List as of 01/23/2017  4:01 PM    START taking these medications   Details  triamcinolone cream (KENALOG) 0.1 % Apply 1 application topically 2 (two) times daily., Starting Thu 01/23/2017, Print         Roby, Arlyss Repress B, PA-C 01/23/17 1718    Cathren Laine, MD 01/27/17 1114

## 2017-01-23 NOTE — ED Triage Notes (Signed)
Pt reports she began to have neck and upper back pain upon awakening this am. Had dental procedure done yesterday. No known injury.  Pt also requests to be checked for STDs. Pt has had unprotected sex recently and has had vaginal discharge.  Pt also reports she has a rash on her lower back.

## 2017-01-24 LAB — HIV ANTIBODY (ROUTINE TESTING W REFLEX): HIV Screen 4th Generation wRfx: NONREACTIVE

## 2017-04-14 ENCOUNTER — Encounter (HOSPITAL_COMMUNITY): Payer: Self-pay | Admitting: Emergency Medicine

## 2017-04-14 ENCOUNTER — Other Ambulatory Visit: Payer: Self-pay

## 2017-04-14 ENCOUNTER — Emergency Department (HOSPITAL_COMMUNITY)
Admission: EM | Admit: 2017-04-14 | Discharge: 2017-04-14 | Disposition: A | Discharge status: Home / Self Care | Payer: BLUE CROSS/BLUE SHIELD | Attending: Emergency Medicine | Admitting: Emergency Medicine

## 2017-04-14 DIAGNOSIS — R102 Pelvic and perineal pain: Secondary | ICD-10-CM | POA: Diagnosis present

## 2017-04-14 DIAGNOSIS — B9689 Other specified bacterial agents as the cause of diseases classified elsewhere: Secondary | ICD-10-CM

## 2017-04-14 DIAGNOSIS — N760 Acute vaginitis: Secondary | ICD-10-CM | POA: Insufficient documentation

## 2017-04-14 DIAGNOSIS — Z79899 Other long term (current) drug therapy: Secondary | ICD-10-CM | POA: Insufficient documentation

## 2017-04-14 LAB — URINALYSIS, ROUTINE W REFLEX MICROSCOPIC
Bilirubin Urine: NEGATIVE
GLUCOSE, UA: NEGATIVE mg/dL
Hgb urine dipstick: NEGATIVE
Ketones, ur: NEGATIVE mg/dL
Nitrite: NEGATIVE
PROTEIN: NEGATIVE mg/dL
SPECIFIC GRAVITY, URINE: 1.028 (ref 1.005–1.030)
pH: 5 (ref 5.0–8.0)

## 2017-04-14 LAB — WET PREP, GENITAL
SPERM: NONE SEEN
TRICH WET PREP: NONE SEEN
Yeast Wet Prep HPF POC: NONE SEEN

## 2017-04-14 LAB — GC/CHLAMYDIA PROBE AMP (~~LOC~~) NOT AT ARMC
CHLAMYDIA, DNA PROBE: NEGATIVE
Neisseria Gonorrhea: NEGATIVE

## 2017-04-14 LAB — PREGNANCY, URINE: Preg Test, Ur: NEGATIVE

## 2017-04-14 LAB — HIV ANTIBODY (ROUTINE TESTING W REFLEX): HIV Screen 4th Generation wRfx: NONREACTIVE

## 2017-04-14 MED ORDER — METRONIDAZOLE 500 MG PO TABS: 500.0000 mg | ORAL_TABLET | Freq: Two times a day (BID) | ORAL | 0 refills | Status: DC

## 2017-04-14 NOTE — ED Notes (Signed)
Pelvic cart at bedside. Patient is having lower left and right abdominal pain. Patient states she is having discharge from her vagina.

## 2017-04-14 NOTE — ED Triage Notes (Signed)
Pt complains of lower abd pain and a vaginal discharge for three weeks, she has been treated for syphilis

## 2017-04-14 NOTE — ED Provider Notes (Signed)
[facility]-EMERGENCY DEPT Provider Note   CSN: [id number] Arrival date & time: 04/14/17  0033     History   Chief Complaint Chief Complaint  Patient presents with  . Vaginal Discharge    HPI Heidi Estes is a 21 y.o. female w PMHx of multiple exposures to syphillis, presenting to ED with 3 weeks of pelvic pain and urinary frequency. States she has some brownish vaginal discharge. Pt last seen on 01/23/17 for re-exposure to syphillis and was treated with PCN G. States she has had intercourse with the same partner since then, though he stated he was treated for syphillis. Symptoms are intermittent lower abdominal/pelvic pain, inc urinary frequency. LMP beginning of November. No F/C, N/V, or any other complaints. States she used to see an OB/Gyn but needs a new one.  The history is provided by the patient.    Past Medical History:  Diagnosis Date  . Multiple allergies   . Postpartum depression     Patient Active Problem List   Diagnosis Date Noted  . Post term pregnancy, 41 weeks 03/04/2015  . 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 WISDOM TOOTH EXTRACTION Bilateral 07/2013   x4    OB History    Gravida Para Term Preterm AB Living   2 2 2  0   2   SAB TAB Ectopic Multiple Live Births         0 2       Home Medications    Prior to Admission medications   Medication Sig Start Date End Date Taking? Authorizing Provider  Multiple Vitamin (MULTIVITAMIN WITH MINERALS) TABS tablet Take 1 tablet by mouth daily.   Yes [provider]  PARAGARD INTRAUTERINE COPPER IUD IUD 1 each by Intrauterine route once.   Yes [provider]  cyclobenzaprine (FLEXERIL) 10 MG tablet Take 1 tablet (10 mg total) by mouth 2 (two) times daily as needed for muscle spasms. Patient not taking: Reported on 04/14/2017 01/23/17   Renne CriglerGeiple, Joshua, PA-C  ibuprofen (ADVIL,MOTRIN) 600 MG  tablet Take 1 tablet (600 mg total) by mouth every 6 (six) hours as needed. Patient not taking: Reported on 04/14/2017 01/23/17   Renne CriglerGeiple, Joshua, PA-C  metroNIDAZOLE (FLAGYL) 500 MG tablet Take 1 tablet (500 mg total) by mouth 2 (two) times daily. 04/14/17   Robinson, [place] N, PA-C  triamcinolone cream (KENALOG) 0.1 % Apply 1 application topically 2 (two) times daily. Patient not taking: Reported on 04/14/2017 01/23/17   Renne CriglerGeiple, Joshua, PA-C    Family History Family History  Problem Relation Age of Onset  . Asthma Mother   . Hypertension Mother   . Hypertension Maternal Aunt     Social History Social History   Tobacco Use  . Smoking status: Never Smoker  . Smokeless tobacco: Never Used  Substance Use Topics  . Alcohol use: Yes    Comment: social  . Drug use: No     Allergies   Macrobid [nitrofurantoin monohyd macro]   Review of Systems Review of Systems  Constitutional: Negative for chills and fever.  Gastrointestinal: Positive for abdominal pain. Negative for nausea and vomiting.  Genitourinary: Positive for frequency and vaginal discharge. Negative for dysuria and vaginal bleeding.  All other systems reviewed and are negative.    Physical Exam Updated Vital Signs BP 115/78   Pulse 84   Temp 98.9 F (37.2 C) (Oral)   Resp 16  Ht 5\' 1"  (1.549 m)   Wt 81.6 kg (180 lb)   LMP 03/26/2017   SpO2 100%   BMI 34.01 kg/m   Physical Exam  Constitutional: She appears well-developed and well-nourished. No distress.  HENT:  Head: Normocephalic and atraumatic.  Mouth/Throat: Oropharynx is clear and moist.  Eyes: Conjunctivae are normal.  Cardiovascular: Normal rate, regular rhythm, normal heart sounds and intact distal pulses.  Pulmonary/Chest: Effort normal and breath sounds normal.  Abdominal: Soft. Bowel sounds are normal. She exhibits no distension. There is no tenderness. There is no rebound and no guarding.  Genitourinary: There is no tenderness on the right  labia. There is no tenderness on the left labia. Cervix exhibits discharge. Cervix exhibits no motion tenderness. Right adnexum displays no mass and no tenderness. Left adnexum displays no mass and no tenderness. There is erythema in the vagina. No tenderness in the vagina. Vaginal discharge found.  Genitourinary Comments: Exam performed with female chaperone present.  Thick White/yellow discharge. IUD strings in place at cervical os. No CMT or adnexal tenderness  Neurological: She is alert.  Skin: Skin is warm.  Psychiatric: She has a normal mood and affect. Her behavior is normal.  Nursing note and vitals reviewed.    ED Treatments / Results  Labs (all labs ordered are listed, but only abnormal results are displayed) Labs Reviewed  WET PREP, GENITAL - Abnormal; Notable for the following components:      Result Value   Clue Cells Wet Prep HPF POC PRESENT (*)    WBC, Wet Prep HPF POC MANY (*)    All other components within normal limits  URINALYSIS, ROUTINE W REFLEX MICROSCOPIC - Abnormal; Notable for the following components:   Leukocytes, UA SMALL (*)    Bacteria, UA RARE (*)    Squamous Epithelial / LPF 0-5 (*)    All other components within normal limits  PREGNANCY, URINE  RPR  HIV ANTIBODY (ROUTINE TESTING)  GC/CHLAMYDIA PROBE AMP ([place]) NOT AT [facility]    EKG  EKG Interpretation None       Radiology No results found.  Procedures Procedures (including critical care time)  Medications Ordered in ED Medications - No data to display   Initial Impression / Assessment and Plan / ED Course  I have reviewed the triage vital signs and the nursing notes.  Pertinent labs & imaging results that were available during my care of the patient were reviewed by me and considered in my medical decision making (see chart for details).    Pt notes with pelvic pain and vaginal discharge times 3 weeks.  Patient with multiple diagnoses of syphilis in the past.  No nausea or  vomiting, afebrile, nontoxic.  Pelvic exam without CMT or adnexal tenderness, consistent with BV.  The amount concerning for PID or acute pelvic pathology.  Wet prep with clue cells and white blood cells. U/a neg.  Urine preg neg. RPR, HIV, gonorrhea,, chlamydia sent.  Recommend patient establish care with OB/GYN, as she has had multiple ED visits for pelvic complaints and STD exposures.  Also discussed that patient can receive treatment for STDs at the health department.  Provided information for the health department as well.  Pt aware she has these test results pending, and instructed to avoid sexual activity until she knows these results.  Patient also informed that if these test results come back positive, that she can receive treatment at the health department, and needs to inform all sexual partners of these positive  test results of they can be treated as well.  Discharged with Flagyl for BV.   Discussed results, findings, treatment and follow up. Patient advised of return precautions. Patient verbalized understanding and agreed with plan.  Final Clinical Impressions(s) / ED Diagnoses   Final diagnoses:  BV (bacterial vaginosis)    ED Discharge Orders        Ordered    metroNIDAZOLE (FLAGYL) 500 MG tablet  2 times daily     04/14/17 0414       Robinson, [place] N, PA-C 04/14/17 [id number]    Palumbo, April, MD 04/14/17 0600

## 2017-04-14 NOTE — Discharge Instructions (Signed)
Please read the instructions below. Talk with your primary care provider about any new medications. Please schedule an appointment for follow up with your OBGYN or primary care. Finish your antibiotic (Flagyl/Metronidazole) as prescribed. Do not drink alcohol with this medication as it will cause vomiting. You will receive a call from the hospital if your test results come back positive. Avoid sexual activity until you know your test results. If your results come back positive, it is important that you inform all of your sexual partners. You can go to the health department for treatment of any STDs. Return to the ER for new or worsening symptoms.

## 2017-04-15 LAB — RPR, QUANT+TP ABS (REFLEX): TREPONEMA PALLIDUM AB: POSITIVE — AB

## 2017-04-15 LAB — RPR: RPR Ser Ql: REACTIVE — AB

## 2017-04-25 ENCOUNTER — Encounter (HOSPITAL_COMMUNITY): Payer: Self-pay | Admitting: Emergency Medicine

## 2017-04-25 ENCOUNTER — Emergency Department (HOSPITAL_COMMUNITY)
Admission: EM | Admit: 2017-04-25 | Discharge: 2017-04-25 | Disposition: A | Discharge status: Home / Self Care | Payer: BLUE CROSS/BLUE SHIELD | Attending: Emergency Medicine | Admitting: Emergency Medicine

## 2017-04-25 DIAGNOSIS — Z5321 Procedure and treatment not carried out due to patient leaving prior to being seen by health care provider: Secondary | ICD-10-CM | POA: Diagnosis not present

## 2017-04-25 DIAGNOSIS — K0889 Other specified disorders of teeth and supporting structures: Secondary | ICD-10-CM | POA: Diagnosis not present

## 2017-04-25 NOTE — ED Triage Notes (Signed)
Pt from home stating she tested positive for syphilis about 2 days ago. Pt states she was unable to make her appointment with the health department. Pt states she also has dental pain and a cough that has been ongoing x 2 days. Pt states she has been coughing white mucous. Pt is afebrile and is not tachycardic at time of assessment.

## 2017-04-25 NOTE — ED Provider Notes (Signed)
Patient left without being seen  1. Patient left without being seen        Melene PlanFloyd, Fernandez Kenley, DO 04/25/17 2328

## 2017-04-25 NOTE — ED Notes (Signed)
Pt called for room, no response from lobby 

## 2017-04-26 ENCOUNTER — Encounter (HOSPITAL_COMMUNITY): Payer: Self-pay | Admitting: *Deleted

## 2017-04-26 ENCOUNTER — Emergency Department (HOSPITAL_COMMUNITY)
Admission: EM | Admit: 2017-04-26 | Discharge: 2017-04-26 | Disposition: A | Discharge status: Home / Self Care | Payer: BLUE CROSS/BLUE SHIELD | Attending: Emergency Medicine | Admitting: Emergency Medicine

## 2017-04-26 ENCOUNTER — Other Ambulatory Visit: Payer: Self-pay

## 2017-04-26 DIAGNOSIS — Z79899 Other long term (current) drug therapy: Secondary | ICD-10-CM | POA: Insufficient documentation

## 2017-04-26 DIAGNOSIS — A539 Syphilis, unspecified: Secondary | ICD-10-CM | POA: Diagnosis not present

## 2017-04-26 DIAGNOSIS — Z202 Contact with and (suspected) exposure to infections with a predominantly sexual mode of transmission: Secondary | ICD-10-CM | POA: Diagnosis present

## 2017-04-26 HISTORY — DX: Unspecified sexually transmitted disease: A64

## 2017-04-26 MED ORDER — PENICILLIN G BENZATHINE 1200000 UNIT/2ML IM SUSP
2.4000 10*6.[IU] | Freq: Once | INTRAMUSCULAR | Status: AC
  Administered 2017-04-26: 2.4 10*6.[IU] via INTRAMUSCULAR
  Filled 2017-04-26: qty 4

## 2017-04-26 NOTE — ED Provider Notes (Signed)
[facility]-EMERGENCY DEPT Provider Note   CSN: [id number] Arrival date & time: 04/26/17  1400     History   Chief Complaint Chief Complaint  Patient presents with  . SEXUALLY TRANSMITTED DISEASE    HPI  Heidi Estes is a 21 y.o. 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 Pt was also diagnosed with BV at her last visit and reports she has not yet started to take her flagyl. GC and Chlamydia cultures, as well as HIV testing was negative from this encounter. Pt reports she has continued to have intercourse with the same partner who she has had multiple syphilis exposures. Pt denies any rashes or genital sores. Pt reports a few days of URI symptoms, with rhinorrhea, sore throat and fatigue. Pt denies fevers, N/V or abdominal pain. Pt has continued to have some brownish discharge, unchanged since previous visit, denies pelvic pain. Pt has not established care with gynecologist yet.      Past Medical History:  Diagnosis Date  . Multiple allergies   . Postpartum depression   . STD (female)     Patient Active Problem List   Diagnosis Date Noted  . Post term pregnancy, 41 weeks 03/04/2015  . 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 WISDOM TOOTH EXTRACTION Bilateral 07/2013   x4    OB History    Gravida Para Term Preterm AB Living   2 2 2  0   2   SAB TAB Ectopic Multiple Live Births         0 2       Home Medications    Prior to Admission medications   Medication Sig Start Date End Date Taking? Authorizing Provider  cyclobenzaprine (FLEXERIL) 10 MG tablet Take 1 tablet (10 mg total) by mouth 2 (two) times daily as needed for muscle spasms. Patient not taking: Reported on 04/14/2017 01/23/17    Renne CriglerGeiple, Joshua, PA-C  ibuprofen (ADVIL,MOTRIN) 600 MG tablet Take 1 tablet (600 mg total) by mouth every 6 (six) hours as needed. Patient not taking: Reported on 04/14/2017 01/23/17   Renne CriglerGeiple, Joshua, PA-C  metroNIDAZOLE (FLAGYL) 500 MG tablet Take 1 tablet (500 mg total) by mouth 2 (two) times daily. 04/14/17   Robinson, [place]azil[place] N, PA-C  Multiple Vitamin (MULTIVITAMIN WITH MINERALS) TABS tablet Take 1 tablet by mouth daily.    [provider]  PARAGARD INTRAUTERINE COPPER IUD IUD 1 each by Intrauterine route once.    [provider]  triamcinolone cream (KENALOG) 0.1 % Apply 1 application topically 2 (two) times daily. Patient not taking: Reported on 04/14/2017 01/23/17   Renne CriglerGeiple, Joshua, PA-C    Family History Family History  Problem Relation Age of Onset  . Asthma Mother   . Hypertension Mother   . Hypertension Maternal Aunt     Social History Social History   Tobacco Use  . Smoking status: Never Smoker  . Smokeless tobacco: Never Used  Substance Use Topics  . Alcohol use: Yes    Comment: social  . Drug use: No     Allergies   Macrobid [nitrofurantoin monohyd macro]   Review of Systems Review of Systems  Constitutional: Negative for chills and fever.  HENT: Positive for congestion, rhinorrhea and sore throat.  Respiratory: Negative for cough, chest tightness and shortness of breath.   Cardiovascular: Negative for chest pain.  Gastrointestinal: Negative for abdominal pain, nausea and vomiting.  Genitourinary: Positive for vaginal discharge. Negative for dysuria, flank pain, genital sores, pelvic pain, vaginal bleeding and vaginal pain.  Musculoskeletal: Negative for arthralgias and myalgias.  Skin: Negative for color change and rash.  Neurological: Negative for headaches.     Physical Exam Updated Vital Signs BP (!) 129/91   Pulse (!) 102   Temp 98.5 F (36.9 C)   Resp 20   Ht 5\' 1"  (1.549 m)   Wt 81.6 kg (180 lb)   LMP 04/25/2017   SpO2 99%    BMI 34.01 kg/m   Physical Exam  Constitutional: She appears well-developed and well-nourished. No distress.  HENT:  Head: Normocephalic and atraumatic.  TMs clear with good landmarks, moderate nasal mucosa edema with clear rhinorrhea, posterior oropharynx clear and moist, with some erythema, no edema or exudates  Eyes: Right eye exhibits no discharge. Left eye exhibits no discharge.  Cardiovascular: Normal rate, regular rhythm and normal heart sounds.  Pulmonary/Chest: Effort normal and breath sounds normal. No stridor. No respiratory distress. She has no wheezes. She has no rales.  Abdominal: Soft. Bowel sounds are normal. She exhibits no distension and no mass. There is no tenderness. There is no guarding.  Lymphadenopathy:    She has no cervical adenopathy.  Neurological: She is alert. Coordination normal.  Skin: Skin is warm and dry. Capillary refill takes less than 2 seconds. She is not diaphoretic.  Psychiatric: She has a normal mood and affect. Her behavior is normal.  Nursing note and vitals reviewed.    ED Treatments / Results  Labs (all labs ordered are listed, but only abnormal results are displayed) Labs Reviewed - No data to display  EKG  EKG Interpretation None       Radiology No results found.  Procedures Procedures (including critical care time)  Medications Ordered in ED Medications  penicillin g benzathine (BICILLIN LA) 1200000 UNIT/2ML injection 2.4 Million Units (2.4 Million Units Intramuscular Given 04/26/17 1722)     Initial Impression / Assessment and Plan / ED Course  I have reviewed the triage vital signs and the nursing notes.  Pertinent labs & imaging results that were available during my care of the patient were reviewed by me and considered in my medical decision making (see chart for details).  Patient presents for treatment after she was contacted about positive syphilis test from her visit on 11/26.  Patient denies any rashes or genital  lesions.  Patient does endorse some upper respiratory symptoms, no fevers or chills, patient has been treating symptoms supportively with some improvement, this could be related to syphilis versus upper respiratory virus.  Patient was diagnosed with BV and has not yet started her treatment, endorses some vaginal discharge but denies pelvic or abdominal pain.  On exam patient is well-appearing and vitals are normal.  Lungs clear to auscultation bilaterally.  Patient treated with penicillin for syphilis.  Given patient's multiple exposures to syphilis and many ED visits for pelvic complaints patient encouraged to follow-up with [facility] or the health department.  Stressed the importance of using condoms for protection.  Patient expresses understanding and is in agreement with plan to discharge home.  Final Clinical Impressions(s) / ED Diagnoses   Final diagnoses:  Syphilis    ED Discharge Orders    None       Jodi GeraldsFord, Floye Fesler  N, PA-C 04/27/17 1141    Shaune Pollack, MD 04/27/17 1217

## 2017-04-26 NOTE — Discharge Instructions (Signed)
You treated for syphilis today. Please follow-up with the [facility] or the health department for routine gynecologic care. If you develops rashes or other new or concerning symptoms return for sooner evaluation.

## 2017-04-26 NOTE — ED Triage Notes (Signed)
Pt returns for treatment for STD wheich she was notified was positive. Test on 04/14/17. Unable to be seen at health dept. Pt also c/o mouth hurting as she states she "was giving oral at the time"

## 2017-07-03 ENCOUNTER — Other Ambulatory Visit: Payer: Self-pay

## 2017-07-03 ENCOUNTER — Encounter (HOSPITAL_COMMUNITY): Payer: Self-pay | Admitting: Emergency Medicine

## 2017-07-03 DIAGNOSIS — N760 Acute vaginitis: Secondary | ICD-10-CM | POA: Diagnosis not present

## 2017-07-03 DIAGNOSIS — R109 Unspecified abdominal pain: Secondary | ICD-10-CM | POA: Diagnosis not present

## 2017-07-03 DIAGNOSIS — F1110 Opioid abuse, uncomplicated: Secondary | ICD-10-CM | POA: Diagnosis not present

## 2017-07-03 DIAGNOSIS — B9689 Other specified bacterial agents as the cause of diseases classified elsewhere: Secondary | ICD-10-CM | POA: Insufficient documentation

## 2017-07-03 DIAGNOSIS — N898 Other specified noninflammatory disorders of vagina: Secondary | ICD-10-CM | POA: Diagnosis present

## 2017-07-03 NOTE — ED Triage Notes (Signed)
Pt states she had a new partner yesterday and states he took his condom off  Pt states today she had vomiting and lower abd pain  States she noticed vaginal discharge brown in color and has blisters around her vagina with itching

## 2017-07-04 ENCOUNTER — Emergency Department (HOSPITAL_COMMUNITY)
Admission: EM | Admit: 2017-07-04 | Discharge: 2017-07-04 | Disposition: A | Discharge status: Home / Self Care | Payer: BLUE CROSS/BLUE SHIELD | Attending: Emergency Medicine | Admitting: Emergency Medicine

## 2017-07-04 DIAGNOSIS — N760 Acute vaginitis: Secondary | ICD-10-CM | POA: Diagnosis not present

## 2017-07-04 DIAGNOSIS — B9689 Other specified bacterial agents as the cause of diseases classified elsewhere: Secondary | ICD-10-CM

## 2017-07-04 DIAGNOSIS — N898 Other specified noninflammatory disorders of vagina: Secondary | ICD-10-CM

## 2017-07-04 LAB — I-STAT BETA HCG BLOOD, ED (MC, WL, AP ONLY): I-stat hCG, quantitative: <5 m[IU]/mL (ref <5)

## 2017-07-04 LAB — URINALYSIS, ROUTINE W REFLEX MICROSCOPIC
Bilirubin Urine: NEGATIVE
Glucose, UA: NEGATIVE mg/dL
Hgb urine dipstick: NEGATIVE
Ketones, ur: NEGATIVE mg/dL
Nitrite: NEGATIVE
PH: 8 (ref 5.0–8.0)
Protein, ur: 30 mg/dL — AB
SPECIFIC GRAVITY, URINE: 1.025 (ref 1.005–1.030)

## 2017-07-04 LAB — WET PREP, GENITAL
SPERM: NONE SEEN
TRICH WET PREP: NONE SEEN
YEAST WET PREP: NONE SEEN

## 2017-07-04 LAB — GC/CHLAMYDIA PROBE AMP (~~LOC~~) NOT AT ARMC
CHLAMYDIA, DNA PROBE: NEGATIVE
NEISSERIA GONORRHEA: NEGATIVE

## 2017-07-04 LAB — HIV ANTIBODY (ROUTINE TESTING W REFLEX): HIV SCREEN 4TH GENERATION: NONREACTIVE

## 2017-07-04 MED ORDER — AZITHROMYCIN 250 MG PO TABS
500.0000 mg | ORAL_TABLET | Freq: Once | ORAL | Status: AC
  Administered 2017-07-04: 500 mg via ORAL
  Filled 2017-07-04: qty 2

## 2017-07-04 MED ORDER — STERILE WATER FOR INJECTION IJ SOLN
INTRAMUSCULAR | Status: AC
  Administered 2017-07-04: 05:00:00
  Filled 2017-07-04: qty 10

## 2017-07-04 MED ORDER — METRONIDAZOLE 0.75 % VA GEL: 1.0000 | Freq: Two times a day (BID) | VAGINAL | 0 refills | Status: DC

## 2017-07-04 MED ORDER — CEFTRIAXONE SODIUM 1 G IJ SOLR
500.0000 mg | Freq: Once | INTRAMUSCULAR | Status: AC
  Administered 2017-07-04: 500 mg via INTRAMUSCULAR
  Filled 2017-07-04: qty 10

## 2017-07-04 NOTE — ED Notes (Signed)
Pelvic cart and pelvic set up for patient.

## 2017-07-04 NOTE — ED Provider Notes (Signed)
[facility]-EMERGENCY DEPT Provider Note   CSN: [id number] Arrival date & time: 07/03/17  2052     History   Chief Complaint Chief Complaint  Patient presents with  . Vaginal Discharge  . SEXUALLY TRANSMITTED DISEASE    HPI Heidi Estes is a 22 y.o. female.  The patient presents with concern for STD exposure. She reports being with a new sexual partner yesterday and that he removed the condom during intercourse. Today she reports vomiting and suprapubic abdominal discomfort. No fever.    The history is provided by the patient. No language interpreter was used.  Vaginal Discharge   Associated symptoms include abdominal pain and vomiting. Pertinent negatives include no fever and no dysuria.    Past Medical History:  Diagnosis Date  . Multiple allergies   . Postpartum depression   . STD (female)     Patient Active Problem List   Diagnosis Date Noted  . Post term pregnancy, 41 weeks 03/04/2015  . 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 WISDOM TOOTH EXTRACTION Bilateral 07/2013   x4    OB History    Gravida Para Term Preterm AB Living   2 2 2  0   2   SAB TAB Ectopic Multiple Live Births         0 2       Home Medications    Prior to Admission medications   Medication Sig Start Date End Date Taking? Authorizing Provider  cyclobenzaprine (FLEXERIL) 10 MG tablet Take 1 tablet (10 mg total) by mouth 2 (two) times daily as needed for muscle spasms. Patient not taking: Reported on 04/14/2017 01/23/17   Renne CriglerGeiple, Joshua, PA-C  ibuprofen (ADVIL,MOTRIN) 600 MG tablet Take 1 tablet (600 mg total) by mouth every 6 (six) hours as needed. Patient not taking: Reported on 04/14/2017 01/23/17   Renne CriglerGeiple, Joshua, PA-C  metroNIDAZOLE (FLAGYL) 500 MG tablet Take 1 tablet (500 mg total) by mouth 2 (two) times daily. 04/14/17   Robinson, [place] N, PA-C  metroNIDAZOLE (METROGEL VAGINAL)  0.75 % vaginal gel Place 1 Applicatorful vaginally 2 (two) times daily. 07/04/17   Elpidio AnisUpstill, Ashleymarie Granderson, PA-C  Multiple Vitamin (MULTIVITAMIN WITH MINERALS) TABS tablet Take 1 tablet by mouth daily.    [provider]  PARAGARD INTRAUTERINE COPPER IUD IUD 1 each by Intrauterine route once.    [provider]  triamcinolone cream (KENALOG) 0.1 % Apply 1 application topically 2 (two) times daily. Patient not taking: Reported on 04/14/2017 01/23/17   Renne CriglerGeiple, Joshua, PA-C    Family History Family History  Problem Relation Age of Onset  . Asthma Mother   . Hypertension Mother   . Hypertension Maternal Aunt     Social History Social History   Tobacco Use  . Smoking status: Never Smoker  . Smokeless tobacco: Never Used  Substance Use Topics  . Alcohol use: No    Frequency: Never  . Drug use: No     Allergies   Macrobid [nitrofurantoin monohyd macro]   Review of Systems Review of Systems  Constitutional: Negative for fever.  Gastrointestinal: Positive for abdominal pain and vomiting.  Genitourinary: Positive for vaginal discharge. Negative for dysuria.  Musculoskeletal: Negative for back pain.     Physical Exam Updated Vital Signs BP (!) 146/96 (BP Location: Left Arm)   Pulse 86   Temp 98.3 F (36.8 C) (Oral)   Resp 18   Ht 5\' 1"  (  1.549 m)   Wt 81.2 kg (179 lb)   LMP 06/20/2017 (Exact Date)   SpO2 100%   BMI 33.82 kg/m   Physical Exam  Constitutional: She is oriented to person, place, and time. She appears well-developed and well-nourished.  Neck: Normal range of motion.  Pulmonary/Chest: Effort normal.  Abdominal: Soft. She exhibits no distension. There is no tenderness.  Genitourinary:  Genitourinary Comments: External vagina and vulva are unremarkable in appearance. There is cervical discharge present that is thick, green. No CMT, adnexal mass or tenderness.   Neurological: She is alert and oriented to person, place, and time.  Skin: Skin is warm and  dry.     ED Treatments / Results  Labs (all labs ordered are listed, but only abnormal results are displayed) Labs Reviewed  WET PREP, GENITAL - Abnormal; Notable for the following components:      Result Value   Clue Cells Wet Prep HPF POC PRESENT (*)    WBC, Wet Prep HPF POC FEW (*)    All other components within normal limits  URINALYSIS, ROUTINE W REFLEX MICROSCOPIC - Abnormal; Notable for the following components:   APPearance HAZY (*)    Protein, ur 30 (*)    Leukocytes, UA MODERATE (*)    Bacteria, UA RARE (*)    Squamous Epithelial / LPF 6-30 (*)    All other components within normal limits  HIV ANTIBODY (ROUTINE TESTING)  I-STAT BETA HCG BLOOD, ED ([facility], [facility], [facility] ONLY)  GC/CHLAMYDIA PROBE AMP ([place]) NOT AT [facility]    EKG  EKG Interpretation None       Radiology No results found.  Procedures Procedures (including critical care time)  Medications Ordered in ED Medications  azithromycin (ZITHROMAX) tablet 500 mg (500 mg Oral Given 07/04/17 0448)  cefTRIAXone (ROCEPHIN) injection 500 mg (500 mg Intramuscular Given 07/04/17 0448)  sterile water (preservative free) injection (  Given 07/04/17 0458)     Initial Impression / Assessment and Plan / ED Course  I have reviewed the triage vital signs and the nursing notes.  Pertinent labs & imaging results that were available during my care of the patient were reviewed by me and considered in my medical decision making (see chart for details).     Patient was offered medication for STD exposure which she wanted. There is evidence of BV on wet prep. Will Rx Metrogel for this. She is strongly encouraged to have a GYN doctor for frequent and recurrent vaginal discharge resulting in multiple ED visits.   Final Clinical Impressions(s) / ED Diagnoses   Final diagnoses:  BV (bacterial vaginosis)  Vaginal discharge    ED Discharge Orders        Ordered    metroNIDAZOLE (METROGEL VAGINAL) 0.75 % vaginal gel  2 times  daily     07/04/17 0426       Elpidio Anis, PA-C 07/04/17 1427    Tegeler, Canary Brim, MD 07/06/17 1344

## 2017-10-11 ENCOUNTER — Encounter (HOSPITAL_COMMUNITY): Payer: Self-pay | Admitting: Nurse Practitioner

## 2017-10-11 ENCOUNTER — Emergency Department (HOSPITAL_COMMUNITY)
Admission: EM | Admit: 2017-10-11 | Discharge: 2017-10-11 | Disposition: A | Discharge status: Home / Self Care | Payer: BLUE CROSS/BLUE SHIELD | Attending: Emergency Medicine | Admitting: Emergency Medicine

## 2017-10-11 DIAGNOSIS — N898 Other specified noninflammatory disorders of vagina: Secondary | ICD-10-CM | POA: Diagnosis present

## 2017-10-11 DIAGNOSIS — Z79899 Other long term (current) drug therapy: Secondary | ICD-10-CM | POA: Insufficient documentation

## 2017-10-11 DIAGNOSIS — Z7689 Persons encountering health services in other specified circumstances: Secondary | ICD-10-CM

## 2017-10-11 DIAGNOSIS — Z202 Contact with and (suspected) exposure to infections with a predominantly sexual mode of transmission: Secondary | ICD-10-CM | POA: Insufficient documentation

## 2017-10-11 LAB — WET PREP, GENITAL
Clue Cells Wet Prep HPF POC: NONE SEEN
SPERM: NONE SEEN
Trich, Wet Prep: NONE SEEN
Yeast Wet Prep HPF POC: NONE SEEN

## 2017-10-11 LAB — URINALYSIS, ROUTINE W REFLEX MICROSCOPIC
BILIRUBIN URINE: NEGATIVE
Bacteria, UA: NONE SEEN
Glucose, UA: NEGATIVE mg/dL
HGB URINE DIPSTICK: NEGATIVE
Ketones, ur: NEGATIVE mg/dL
NITRITE: NEGATIVE
Protein, ur: NEGATIVE mg/dL
SPECIFIC GRAVITY, URINE: 1.024 (ref 1.005–1.030)
pH: 6 (ref 5.0–8.0)

## 2017-10-11 LAB — I-STAT BETA HCG BLOOD, ED (MC, WL, AP ONLY)

## 2017-10-11 MED ORDER — AZITHROMYCIN 250 MG PO TABS
1000.0000 mg | ORAL_TABLET | Freq: Once | ORAL | Status: AC
Start: 2017-10-11 — End: 2017-10-11
  Administered 2017-10-11: 1000 mg via ORAL
  Filled 2017-10-11: qty 4

## 2017-10-11 MED ORDER — CEFTRIAXONE SODIUM 250 MG IJ SOLR
250.0000 mg | Freq: Once | INTRAMUSCULAR | Status: AC
  Administered 2017-10-11: 250 mg via INTRAMUSCULAR
  Filled 2017-10-11: qty 250

## 2017-10-11 NOTE — ED Provider Notes (Signed)
[facility]-EMERGENCY DEPT Provider Note   CSN: [id number] Arrival date & time: 10/11/17  1829     History   Chief Complaint Chief Complaint  Patient presents with  . Vaginal Discharge  . STD Check    HPI Heidi Estes Husband is a 22 y.o. female.  HPI   Patient is a 22 year old female who is presenting to the emergency department complaining of vaginal discharge that began 2 days ago.  Patient states that she was with a new partner 2 days ago and had unprotected sex.  Since then has had vaginal discharge, malodorous discharge, dysuria.  Denies any urinary frequency, urgency, hematuria.  Denies any fevers.  She has had some intermittent pelvic pain.  She has none currently.  She also endorses some nausea but denies any vomiting, diarrhea or fevers.  She also reports some vaginal irritation.  Past Medical History:  Diagnosis Date  . Multiple allergies   . Postpartum depression   . STD (female)     Patient Active Problem List   Diagnosis Date Noted  . Post term pregnancy, 41 weeks 03/04/2015  . 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 WISDOM TOOTH EXTRACTION Bilateral 07/2013   x4     OB History    Gravida  2   Para  2   Term  2   Preterm  0   AB      Living  2     SAB      TAB      Ectopic      Multiple  0   Live Births  2            Home Medications    Prior to Admission medications   Medication Sig Start Date End Date Taking? Authorizing Provider  PARAGARD INTRAUTERINE COPPER IUD IUD 1 each by Intrauterine route once.   Yes [provider]  cyclobenzaprine (FLEXERIL) 10 MG tablet Take 1 tablet (10 mg total) by mouth 2 (two) times daily as needed for muscle spasms. Patient not taking: Reported on 04/14/2017 01/23/17   Renne Crigler, PA-C  ibuprofen (ADVIL,MOTRIN) 600 MG tablet Take 1 tablet (600 mg total) by mouth every 6 (six) hours as  needed. Patient not taking: Reported on 04/14/2017 01/23/17   Renne Crigler, PA-C  metroNIDAZOLE (FLAGYL) 500 MG tablet Take 1 tablet (500 mg total) by mouth 2 (two) times daily. Patient not taking: Reported on 10/11/2017 04/14/17   Robinson, [place] N, PA-C  metroNIDAZOLE (METROGEL VAGINAL) 0.75 % vaginal gel Place 1 Applicatorful vaginally 2 (two) times daily. Patient not taking: Reported on 10/11/2017 07/04/17   Elpidio Anis, PA-C  triamcinolone cream (KENALOG) 0.1 % Apply 1 application topically 2 (two) times daily. Patient not taking: Reported on 04/14/2017 01/23/17   Renne Crigler, PA-C    Family History Family History  Problem Relation Age of Onset  . Asthma Mother   . Hypertension Mother   . Hypertension Maternal Aunt     Social History Social History   Tobacco Use  . Smoking status: Never Smoker  . Smokeless tobacco: Never Used  Substance Use Topics  . Alcohol use: No    Frequency: Never  . Drug use: No     Allergies   Macrobid [nitrofurantoin monohyd macro]   Review of Systems Review of Systems  Constitutional: Negative for fever.  Eyes: Negative for visual disturbance.  Respiratory: Negative for shortness of breath.  Cardiovascular: Negative for chest pain.  Gastrointestinal: Negative for abdominal pain.  Genitourinary: Positive for dysuria, pelvic pain and vaginal discharge. Negative for decreased urine volume, flank pain, frequency, hematuria, urgency and vaginal bleeding.       Vaginal irritation  Musculoskeletal: Negative for back pain.   Physical Exam Updated Vital Signs BP 123/74 (BP Location: Left Arm)   Pulse 89   Temp 99.2 F (37.3 C) (Oral)   Resp 18   SpO2 99%   Physical Exam  Constitutional: She appears well-developed and well-nourished. No distress.  HENT:  Head: Normocephalic and atraumatic.  Eyes: Conjunctivae are normal.  Neck: Neck supple.  Cardiovascular: Normal rate, regular rhythm and normal heart sounds.  Pulmonary/Chest:  Effort normal and breath sounds normal. She has no wheezes.  Abdominal: Soft. Bowel sounds are normal. She exhibits no distension and no mass. There is no tenderness. There is no guarding.  Genitourinary:  Genitourinary Comments: Exam performed by Karrie Meres,  exam chaperoned Date: 10/11/2017 Pelvic exam: normal external genitalia without evidence of trauma. VULVA: normal appearing vulva with no masses, tenderness or lesion. VAGINA: normal appearing vagina with normal color, no lesions, copious white/yellow discharge CERVIX: normal appearing cervix without lesions, cervical motion tenderness absent, cervical os closed, purulent discharge noted, IUD strings noted, Wet prep and DNA probe for chlamydia and GC obtained.   ADNEXA: normal adnexa in size, nontender and no masses UTERUS: uterus is normal size, shape, consistency and nontender.   Musculoskeletal: Normal range of motion.  Neurological: She is alert.  Skin: Skin is warm and dry.  Psychiatric: She has a normal mood and affect.  Nursing note and vitals reviewed.  ED Treatments / Results  Labs (all labs ordered are listed, but only abnormal results are displayed) Labs Reviewed  WET PREP, GENITAL - Abnormal; Notable for the following components:      Result Value   WBC, Wet Prep HPF POC MANY (*)    All other components within normal limits  URINALYSIS, ROUTINE W REFLEX MICROSCOPIC - Abnormal; Notable for the following components:   Leukocytes, UA MODERATE (*)    All other components within normal limits  I-STAT BETA HCG BLOOD, ED ([facility], [facility], [facility] ONLY)  GC/CHLAMYDIA PROBE AMP ([place]) NOT AT [facility]    EKG None  Radiology No results found.  Procedures Procedures (including critical care time)  Medications Ordered in ED Medications  cefTRIAXone (ROCEPHIN) injection 250 mg (250 mg Intramuscular Given 10/11/17 2147)  azithromycin (ZITHROMAX) tablet 1,000 mg (1,000 mg Oral Given 10/11/17 2148)     Initial Impression /  Assessment and Plan / ED Course  I have reviewed the triage vital signs and the nursing notes.  Pertinent labs & imaging results that were available during my care of the patient were reviewed by me and considered in my medical decision making (see chart for details).     Final Clinical Impressions(s) / ED Diagnoses   Final diagnoses:  Encounter for assessment of STD exposure   Patient to be discharged with instructions to follow up with OBGYN. Discussed importance of using protection when sexually active. Pt understands that they have GC/Chlamydia cultures pending and that they will need to inform all sexual partners if results return positive. Pt has been treated prophylacticly with azithromycin and rocephin due to pts history, pelvic exam, and wet prep with increased WBCs.  No clue cells on wet prep.  UA with leukocytes likely secondary STD. Have low suspicion for UTI as pt has sterile pyuria  which is more consistent with STD.  Pt not concerning for PID because hemodynamically stable and no cervical motion tenderness on pelvic exam.  Advised on safe sex practices.  Advised her to follow-up with OB/GYN and to return to the ER she has any new or worsening symptoms in the meantime.  All questions were answered and patient understand the plan and reasons to return immediately to the ED.  ED Discharge Orders    None       Rayne Du 10/11/17 2237    Bethann Berkshire, MD 10/11/17 [phone]

## 2017-10-11 NOTE — ED Triage Notes (Signed)
Pt is c/o white-yellow vaginal discharge and vaginal itching. She is requesting STD check, reports unprotected sex with a new partner 2 days ago.

## 2017-10-11 NOTE — ED Notes (Signed)
Lt green and lav tubes in lab if needed.

## 2017-10-11 NOTE — Discharge Instructions (Signed)
You have been tested for chlamydia and gonorrhea.  These results will be available in approximately 3 days and you will be contacted by the hospital if the results are positive. Avoid sexual contact until you are aware of the results, and please inform all sexual partners if you test positive for any of these diseases.  Please follow up with your primary care provider within 5-7 days for re-evaluation of your symptoms. If you do not have a primary care provider, information for a healthcare clinic has been provided for you to make arrangements for follow up care. Please return to the emergency department for any new or worsening symptoms.  

## 2017-10-14 LAB — GC/CHLAMYDIA PROBE AMP (~~LOC~~) NOT AT ARMC
Chlamydia: NEGATIVE
Neisseria Gonorrhea: NEGATIVE

## 2017-12-26 DIAGNOSIS — R21 Rash and other nonspecific skin eruption: Secondary | ICD-10-CM | POA: Insufficient documentation

## 2017-12-26 DIAGNOSIS — L299 Pruritus, unspecified: Secondary | ICD-10-CM | POA: Diagnosis not present

## 2017-12-26 DIAGNOSIS — Z202 Contact with and (suspected) exposure to infections with a predominantly sexual mode of transmission: Secondary | ICD-10-CM | POA: Diagnosis present

## 2017-12-26 DIAGNOSIS — L02411 Cutaneous abscess of right axilla: Secondary | ICD-10-CM | POA: Diagnosis not present

## 2017-12-26 DIAGNOSIS — Z79899 Other long term (current) drug therapy: Secondary | ICD-10-CM | POA: Diagnosis not present

## 2017-12-26 DIAGNOSIS — R1084 Generalized abdominal pain: Secondary | ICD-10-CM | POA: Insufficient documentation

## 2017-12-27 ENCOUNTER — Other Ambulatory Visit: Payer: Self-pay

## 2017-12-27 ENCOUNTER — Encounter (HOSPITAL_COMMUNITY): Payer: Self-pay | Admitting: Emergency Medicine

## 2017-12-27 ENCOUNTER — Emergency Department (HOSPITAL_COMMUNITY)
Admission: EM | Admit: 2017-12-27 | Discharge: 2017-12-27 | Disposition: A | Discharge status: Home / Self Care | Payer: BLUE CROSS/BLUE SHIELD | Attending: Emergency Medicine | Admitting: Emergency Medicine

## 2017-12-27 DIAGNOSIS — R21 Rash and other nonspecific skin eruption: Secondary | ICD-10-CM

## 2017-12-27 DIAGNOSIS — Z113 Encounter for screening for infections with a predominantly sexual mode of transmission: Secondary | ICD-10-CM

## 2017-12-27 LAB — PREGNANCY, URINE: Preg Test, Ur: NEGATIVE

## 2017-12-27 LAB — WET PREP, GENITAL
CLUE CELLS WET PREP: NONE SEEN
Sperm: NONE SEEN
TRICH WET PREP: NONE SEEN
YEAST WET PREP: NONE SEEN

## 2017-12-27 LAB — URINALYSIS, ROUTINE W REFLEX MICROSCOPIC
Bilirubin Urine: NEGATIVE
Glucose, UA: NEGATIVE mg/dL
Hgb urine dipstick: NEGATIVE
Ketones, ur: NEGATIVE mg/dL
Nitrite: NEGATIVE
PH: 7 (ref 5.0–8.0)
Protein, ur: NEGATIVE mg/dL
SPECIFIC GRAVITY, URINE: 1.031 — AB (ref 1.005–1.030)

## 2017-12-27 MED ORDER — HYDROCORTISONE 1 % EX CREA: TOPICAL_CREAM | CUTANEOUS | 0 refills | Status: DC

## 2017-12-27 MED ORDER — DOXYCYCLINE HYCLATE 100 MG PO CAPS: 100.0000 mg | ORAL_CAPSULE | Freq: Two times a day (BID) | ORAL | 0 refills | Status: DC

## 2017-12-27 NOTE — ED Triage Notes (Signed)
Pt presents to the ED with multiple complaints. Patient's main complaints are rash and lower abdominal pain. Patient unsure of what is causing the rash. Patient also complaining of a lump that is tender under her right arm.

## 2017-12-27 NOTE — ED Provider Notes (Signed)
[facility]-EMERGENCY DEPT Provider Note   CSN: [id number] Arrival date & time: 12/26/17  2347     History   Chief Complaint Chief Complaint  Patient presents with  . Abdominal Pain  . Rash    HPI Heidi Estes is a 22 y.o. female.  Patient presents to the emergency department with a chief complaint of multiple complaints.  1.  STD exposure: Patient is concerned about having been exposed to STDs.  She would like to be tested today in the emergency department.  She complains of some crampy lower abdominal pain which is been ongoing for about a month.  She denies any vomiting.  Denies any fever, chills, or dysuria.  2.  Skin rash: Patient has a pruritic rash on her back.  She states that it comes and goes.  She has not tried anything for this.  3.  Abscess: Patient complains of a bump in her right axilla.  She states that she gets these from time to time.  She has not tried anything for this.  She has had good success with taking antibiotics in the past.  The history is provided by the patient. No language interpreter was used.    Past Medical History:  Diagnosis Date  . Multiple allergies   . Postpartum depression   . STD (female)     Patient Active Problem List   Diagnosis Date Noted  . Post term pregnancy, 41 weeks 03/04/2015  . 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 WISDOM TOOTH EXTRACTION Bilateral 07/2013   x4     OB History    Gravida  2   Para  2   Term  2   Preterm  0   AB      Living  2     SAB      TAB      Ectopic      Multiple  0   Live Births  2            Home Medications    Prior to Admission medications   Medication Sig Start Date End Date Taking? Authorizing Provider  hydroxypropyl methylcellulose / hypromellose (ISOPTO TEARS / GONIOVISC) 2.5 % ophthalmic solution Place 1 drop into both eyes 3 (three) times daily as  needed for dry eyes.   Yes [provider]  cyclobenzaprine (FLEXERIL) 10 MG tablet Take 1 tablet (10 mg total) by mouth 2 (two) times daily as needed for muscle spasms. Patient not taking: Reported on 12/27/2017 01/23/17   Renne CriglerGeiple, Joshua, PA-C  ibuprofen (ADVIL,MOTRIN) 600 MG tablet Take 1 tablet (600 mg total) by mouth every 6 (six) hours as needed. Patient not taking: Reported on 04/14/2017 01/23/17   Renne CriglerGeiple, Joshua, PA-C  metroNIDAZOLE (FLAGYL) 500 MG tablet Take 1 tablet (500 mg total) by mouth 2 (two) times daily. Patient not taking: Reported on 10/11/2017 04/14/17   Robinson, Swazil[place]Jordan N, PA-C  metroNIDAZOLE (METROGEL VAGINAL) 0.75 % vaginal gel Place 1 Applicatorful vaginally 2 (two) times daily. Patient not taking: Reported on 12/27/2017 07/04/17   Elpidio AnisUpstill, Shari, PA-C  triamcinolone cream (KENALOG) 0.1 % Apply 1 application topically 2 (two) times daily. Patient not taking: Reported on 04/14/2017 01/23/17   Renne CriglerGeiple, Joshua, PA-C    Family History Family History  Problem Relation Age of Onset  . Asthma Mother   . Hypertension Mother   . Hypertension Maternal Aunt  Social History Social History   Tobacco Use  . Smoking status: Never Smoker  . Smokeless tobacco: Never Used  Substance Use Topics  . Alcohol use: Yes    Frequency: Never    Comment: Occassional   . Drug use: No     Allergies   Macrobid [nitrofurantoin monohyd macro]   Review of Systems Review of Systems  All other systems reviewed and are negative.    Physical Exam Updated Vital Signs BP 121/85 (BP Location: Left Arm)   Pulse (!) 59   Temp 98.4 F (36.9 C) (Oral)   Resp 18   LMP 12/19/2017   SpO2 100%   Physical Exam  Constitutional: She is oriented to person, place, and time. She appears well-developed and well-nourished.  HENT:  Head: Normocephalic and atraumatic.  Eyes: Pupils are equal, round, and reactive to light. Conjunctivae and EOM are normal.  Neck: Normal range of motion. Neck  supple.  Cardiovascular: Normal rate and regular rhythm. Exam reveals no gallop and no friction rub.  No murmur heard. Pulmonary/Chest: Effort normal and breath sounds normal. No respiratory distress. She has no wheezes. She has no rales. She exhibits no tenderness.  Abdominal: Soft. Bowel sounds are normal. She exhibits no distension and no mass. There is no tenderness. There is no rebound and no guarding.  Genitourinary:  Genitourinary Comments: Pelvic exam chaperoned by female ER tech, no right or left adnexal tenderness, no uterine tenderness, no vaginal discharge, no bleeding, no CMT or friability, no foreign body, no injury to the external genitalia, no other significant findings    Musculoskeletal: Normal range of motion. She exhibits no edema or tenderness.  Neurological: She is alert and oriented to person, place, and time.  Skin: Skin is warm and dry.  Very small indurated area in the right axilla, no surrounding cellulitis  Scaly papular rash on right upper back, no evidence of cellulitis or abscess  Psychiatric: She has a normal mood and affect. Her behavior is normal. Judgment and thought content normal.  Nursing note and vitals reviewed.    ED Treatments / Results  Labs (all labs ordered are listed, but only abnormal results are displayed) Labs Reviewed  WET PREP, GENITAL  URINALYSIS, ROUTINE W REFLEX MICROSCOPIC  PREGNANCY, URINE  GC/CHLAMYDIA PROBE AMP () NOT AT [facility]    EKG None  Radiology No results found.  Procedures Procedures (including critical care time)  Medications Ordered in ED Medications - No data to display   Initial Impression / Assessment and Plan / ED Course  I have reviewed the triage vital signs and the nursing notes.  Pertinent labs & imaging results that were available during my care of the patient were reviewed by me and considered in my medical decision making (see chart for details).     Patient with multiple  complaints.  She would like to have STD testing today.  She also has a small bump in her right axilla, and has a history of hidradenitis.  There is no drainable fluid collection, and no surrounding cellulitis.  Will cover with doxycycline and recommend warm compresses.  She also has a rash on her back, which is pruritic, and etiology is unclear, will give hydrocortisone cream.  Final Clinical Impressions(s) / ED Diagnoses   Final diagnoses:  Screen for STD (sexually transmitted disease)  Rash    ED Discharge Orders         Ordered    doxycycline (VIBRAMYCIN) 100 MG capsule  2 times daily  12/27/17 0509    hydrocortisone cream 1 %     12/27/17 0509           Roxy Horseman, PA-C 12/27/17 1610    Nira Conn, MD 12/27/17 [phone]

## 2017-12-29 LAB — GC/CHLAMYDIA PROBE AMP (~~LOC~~) NOT AT ARMC
CHLAMYDIA, DNA PROBE: NEGATIVE
NEISSERIA GONORRHEA: NEGATIVE

## 2018-02-20 ENCOUNTER — Ambulatory Visit (HOSPITAL_COMMUNITY)
Admission: EM | Admit: 2018-02-20 | Discharge: 2018-02-20 | Disposition: A | Discharge status: Home / Self Care | Payer: BLUE CROSS/BLUE SHIELD | Attending: Family Medicine | Admitting: Family Medicine

## 2018-02-20 ENCOUNTER — Encounter (HOSPITAL_COMMUNITY): Payer: Self-pay

## 2018-02-20 DIAGNOSIS — R110 Nausea: Secondary | ICD-10-CM | POA: Diagnosis not present

## 2018-02-20 DIAGNOSIS — N898 Other specified noninflammatory disorders of vagina: Secondary | ICD-10-CM | POA: Insufficient documentation

## 2018-02-20 DIAGNOSIS — Z113 Encounter for screening for infections with a predominantly sexual mode of transmission: Secondary | ICD-10-CM | POA: Diagnosis not present

## 2018-02-20 DIAGNOSIS — R109 Unspecified abdominal pain: Secondary | ICD-10-CM | POA: Diagnosis present

## 2018-02-20 DIAGNOSIS — Z3202 Encounter for pregnancy test, result negative: Secondary | ICD-10-CM | POA: Diagnosis not present

## 2018-02-20 LAB — POCT URINALYSIS DIP (DEVICE)
Bilirubin Urine: NEGATIVE
GLUCOSE, UA: NEGATIVE mg/dL
Ketones, ur: NEGATIVE mg/dL
LEUKOCYTES UA: NEGATIVE
Nitrite: NEGATIVE
Protein, ur: NEGATIVE mg/dL
Specific Gravity, Urine: 1.02 (ref 1.005–1.030)
Urobilinogen, UA: 0.2 mg/dL (ref 0.0–1.0)
pH: 7 (ref 5.0–8.0)

## 2018-02-20 LAB — POCT PREGNANCY, URINE: Preg Test, Ur: NEGATIVE

## 2018-02-20 MED ORDER — BENZOYL PEROXIDE 5 % EX LIQD: Freq: Two times a day (BID) | CUTANEOUS | 12 refills | Status: DC

## 2018-02-20 MED ORDER — ONDANSETRON 4 MG PO TBDP: 4.0000 mg | ORAL_TABLET | Freq: Three times a day (TID) | ORAL | 0 refills | Status: DC | PRN

## 2018-02-20 NOTE — ED Triage Notes (Signed)
Pt presents with complaints of emesis since Monday that has improved and pain in rib cage. Reports feeling better.

## 2018-02-20 NOTE — ED Provider Notes (Signed)
[facility]    CSN: [id number] Arrival date & time: 02/20/18  1133     History   Chief Complaint Chief Complaint  Patient presents with  . Abdominal Pain    HPI Heidi Estes is a 22 y.o. female no significant past medical history presenting today for evaluation of nausea, vomiting and abdominal pain.  Patient states that beginning earlier this week she developed nausea and vomiting, his symptoms worsened yesterday, but since last night and this morning she has felt improved.  She no longer has abdominal pain.  She last vomited around 730 this morning.  She has been tolerating ginger ale and fluids since then.  Has continued to feel nauseous.  Patient is concerned as around when her symptoms started she states that she had sexual intercourse without protection.  She has had discharge since then.  She denies diarrhea, but has had gas.  She did have pelvic pain during intercourse, but denies pelvic pain since then.  Patient is currently on her menstrual cycle.  Patient is requesting STD screening to include HIV and syphilis.  She has tested positive for syphilis before, approximately 1 year ago.  Denies associated URI symptoms.  Patient has had some mild dysuria.  Had itching prior to onset of her cycle, but has not had persistent itching.  HPI  Past Medical History:  Diagnosis Date  . Multiple allergies   . Postpartum depression   . STD (female)     Patient Active Problem List   Diagnosis Date Noted  . Post term pregnancy, 41 weeks 03/04/2015  . 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 WISDOM TOOTH EXTRACTION Bilateral 07/2013   x4    OB History    Gravida  2   Para  2   Term  2   Preterm  0   AB      Living  2     SAB      TAB      Ectopic      Multiple  0   Live Births  2            Home Medications    Prior to Admission medications   Medication Sig Start  Date End Date Taking? Authorizing Provider  hydrocortisone cream 1 % Apply to rash area 2 times daily 12/27/17   Roxy Horseman, PA-C  hydroxypropyl methylcellulose / hypromellose (ISOPTO TEARS / GONIOVISC) 2.5 % ophthalmic solution Place 1 drop into both eyes 3 (three) times daily as needed for dry eyes.    [provider]    Family History Family History  Problem Relation Age of Onset  . Asthma Mother   . Hypertension Mother   . Hypertension Maternal Aunt     Social History Social History   Tobacco Use  . Smoking status: Never Smoker  . Smokeless tobacco: Never Used  Substance Use Topics  . Alcohol use: Yes    Frequency: Never    Comment: Occassional   . Drug use: No     Allergies   Macrobid [nitrofurantoin monohyd macro]   Review of Systems Review of Systems  Constitutional: Negative for fever.  Respiratory: Negative for shortness of breath.   Cardiovascular: Negative for chest pain.  Gastrointestinal: Positive for abdominal pain, nausea and vomiting. Negative for diarrhea.  Genitourinary: Positive for dysuria and vaginal discharge. Negative for flank pain, frequency, genital sores, hematuria, menstrual problem, urgency, vaginal  bleeding and vaginal pain.  Musculoskeletal: Negative for back pain.  Skin: Negative for rash.  Neurological: Negative for dizziness, light-headedness and headaches.     Physical Exam Triage Vital Signs ED Triage Vitals  Enc Vitals Group     BP 02/20/18 1148 115/81     Pulse Rate 02/20/18 1148 75     Resp 02/20/18 1148 18     Temp 02/20/18 1147 98.8 F (37.1 C)     Temp src --      SpO2 02/20/18 1148 99 %     Weight --      Height --      Head Circumference --      Peak Flow --      Pain Score 02/20/18 1147 5     Pain Loc --      Pain Edu? --      Excl. in GC? --    No data found.  Updated Vital Signs BP 115/81   Pulse 75   Temp 98.8 F (37.1 C)   Resp 18   LMP 02/17/2018   SpO2 99%   Visual Acuity Right  Eye Distance:   Left Eye Distance:   Bilateral Distance:    Right Eye Near:   Left Eye Near:    Bilateral Near:     Physical Exam  Constitutional: She appears well-developed and well-nourished. No distress.  HENT:  Head: Normocephalic and atraumatic.  Mouth/Throat: Oropharynx is clear and moist.  Eyes: Pupils are equal, round, and reactive to light. Conjunctivae and EOM are normal.  Neck: Neck supple.  Cardiovascular: Normal rate and regular rhythm.  No murmur heard. Pulmonary/Chest: Effort normal and breath sounds normal. No respiratory distress.  Breathing comfortably at rest, CTABL, no wheezing, rales or other adventitious sounds auscultated  Abdominal: Soft. There is no tenderness.  Abdomen soft, nondistended, no guarding during exam, nontender to light and deep palpation throughout all 4 quadrants and suprapubic area and epigastric area; negative rebound  Genitourinary:  Genitourinary Comments: Deferred  Musculoskeletal: She exhibits no edema.  Neurological: She is alert.  Skin: Skin is warm and dry.  Psychiatric: She has a normal mood and affect.  Nursing note and vitals reviewed.    UC Treatments / Results  Labs (all labs ordered are listed, but only abnormal results are displayed) Labs Reviewed  HIV ANTIBODY (ROUTINE TESTING W REFLEX)  RPR  CERVICOVAGINAL ANCILLARY ONLY    EKG None  Radiology No results found.  Procedures Procedures (including critical care time)  Medications Ordered in UC Medications - No data to display  Initial Impression / Assessment and Plan / UC Course  I have reviewed the triage vital signs and the nursing notes.  Pertinent labs & imaging results that were available during my care of the patient were reviewed by me and considered in my medical decision making (see chart for details).     Patient with nausea, decreased vomiting, no abdominal pain at time of visit.  Screening for STDs including HIV and syphilis.  Will call  patient with results and provide treatment if needed.  At this time will treat nausea with Zofran and discussed diet recommendations.  Discussed importance of hydration.  Will hold off on any empiric treatment at this time.  Will have patient return if developing worsening abdominal pain, persistent vomiting, signs of dehydration.Discussed strict return precautions. Patient verbalized understanding and is agreeable with plan.  Final Clinical Impressions(s) / UC Diagnoses   Final diagnoses:  Nausea  Vaginal discharge  Screen for STD (sexually transmitted disease)     Discharge Instructions     We are testing you for Gonorrhea, Chlamydia, Trichomonas, Yeast and Bacterial Vaginosis. We will call you if anything is positive and let you know if you require any further treatment. Please inform partners of any positive results.   For nausea: Zofran prescribed. Begin with every 6 hours, than as you are able to hold food down, take it as needed. Start with clear liquids, then move to plain foods like bananas, rice, applesauce, toast, broth, grits, oatmeal. As those food settle okay you may transition to your normal foods. Avoid spicy and greasy foods as much as possible.  Preventing dehydration is key! You need to replace the fluid your body is expelling. Drink plenty of fluids, may use Pedialyte or sports drinks.   Please return if you are experiencing blood in your vomit or stool or experiencing dizziness, lightheadedness, extreme fatigue, increased abdominal pain. Please return if symptoms not improving with treatment, development of fever, nausea, vomiting, abdominal pain.   ED Prescriptions    None     Controlled Substance Prescriptions [place] Controlled Substance Registry consulted? Not Applicable   Lew Dawes, [place] 02/20/18 1309

## 2018-02-20 NOTE — Discharge Instructions (Addendum)
We are testing you for Gonorrhea, Chlamydia, Trichomonas, Yeast and Bacterial Vaginosis. We will call you if anything is positive and let you know if you require any further treatment. Please inform partners of any positive results.   For nausea: Zofran prescribed. Begin with every 6 hours, than as you are able to hold food down, take it as needed. Start with clear liquids, then move to plain foods like bananas, rice, applesauce, toast, broth, grits, oatmeal. As those food settle okay you may transition to your normal foods. Avoid spicy and greasy foods as much as possible.  Preventing dehydration is key! You need to replace the fluid your body is expelling. Drink plenty of fluids, may use Pedialyte or sports drinks.   Please return if you are experiencing blood in your vomit or stool or experiencing dizziness, lightheadedness, extreme fatigue, increased abdominal pain. Please return if symptoms not improving with treatment, development of fever, nausea, vomiting, abdominal pain.

## 2018-02-21 LAB — HIV ANTIBODY (ROUTINE TESTING W REFLEX): HIV SCREEN 4TH GENERATION: NONREACTIVE

## 2018-02-21 LAB — RPR: RPR: NONREACTIVE

## 2018-02-23 ENCOUNTER — Telehealth (HOSPITAL_COMMUNITY): Payer: Self-pay | Admitting: Emergency Medicine

## 2018-02-23 LAB — CERVICOVAGINAL ANCILLARY ONLY
Bacterial vaginitis: NEGATIVE
Candida vaginitis: NEGATIVE
Chlamydia: NEGATIVE
Neisseria Gonorrhea: NEGATIVE
Trichomonas: NEGATIVE

## 2018-02-23 NOTE — Telephone Encounter (Signed)
[facility] pharmacy on file called seeking clarification of order for benzoyl peroxide "liquid".  Spoke to [employer], [place] about clarification.  "lotion" is ordered.  Called pharmacy on file and gave clarification.

## 2018-03-20 ENCOUNTER — Other Ambulatory Visit: Payer: Self-pay

## 2018-03-20 ENCOUNTER — Emergency Department (HOSPITAL_COMMUNITY)
Admission: EM | Admit: 2018-03-20 | Discharge: 2018-03-20 | Disposition: A | Discharge status: Home / Self Care | Payer: BLUE CROSS/BLUE SHIELD | Attending: Emergency Medicine | Admitting: Emergency Medicine

## 2018-03-20 DIAGNOSIS — Z79899 Other long term (current) drug therapy: Secondary | ICD-10-CM | POA: Diagnosis not present

## 2018-03-20 DIAGNOSIS — R0981 Nasal congestion: Secondary | ICD-10-CM | POA: Diagnosis not present

## 2018-03-20 DIAGNOSIS — R05 Cough: Secondary | ICD-10-CM | POA: Diagnosis not present

## 2018-03-20 DIAGNOSIS — Z113 Encounter for screening for infections with a predominantly sexual mode of transmission: Secondary | ICD-10-CM | POA: Diagnosis not present

## 2018-03-20 DIAGNOSIS — Z8619 Personal history of other infectious and parasitic diseases: Secondary | ICD-10-CM | POA: Insufficient documentation

## 2018-03-20 DIAGNOSIS — R112 Nausea with vomiting, unspecified: Secondary | ICD-10-CM | POA: Insufficient documentation

## 2018-03-20 DIAGNOSIS — R059 Cough, unspecified: Secondary | ICD-10-CM

## 2018-03-20 LAB — I-STAT CHEM 8, ED
BUN: 6 mg/dL (ref 6–20)
CREATININE: 0.8 mg/dL (ref 0.44–1.00)
Calcium, Ion: 1.22 mmol/L (ref 1.15–1.40)
Chloride: 104 mmol/L (ref 98–111)
GLUCOSE: 124 mg/dL — AB (ref 70–99)
HEMATOCRIT: 37 % (ref 36.0–46.0)
HEMOGLOBIN: 12.6 g/dL (ref 12.0–15.0)
Potassium: 3.8 mmol/L (ref 3.5–5.1)
Sodium: 142 mmol/L (ref 135–145)
TCO2: 26 mmol/L (ref 22–32)

## 2018-03-20 LAB — I-STAT BETA HCG BLOOD, ED (MC, WL, AP ONLY)

## 2018-03-20 MED ORDER — BENZONATATE 100 MG PO CAPS: 100.0000 mg | ORAL_CAPSULE | Freq: Three times a day (TID) | ORAL | 0 refills | Status: DC

## 2018-03-20 MED ORDER — ONDANSETRON 4 MG PO TBDP: 4.0000 mg | ORAL_TABLET | Freq: Three times a day (TID) | ORAL | 0 refills | Status: DC | PRN

## 2018-03-20 MED ORDER — FLUTICASONE PROPIONATE 50 MCG/ACT NA SUSP: 2.0000 | Freq: Every day | NASAL | 1 refills | Status: DC

## 2018-03-20 NOTE — ED Triage Notes (Signed)
Pt reports she has had a cough that makes her feel tight. Pt also reports she is concerned she is anemic because her house "is really warm but she feels cold" Pt also reports she wants bloodwork done to check for HIV and syphilis.  Pt also reports she is about to go to the [place] and is concerned about being up to date on her shots.

## 2018-03-20 NOTE — ED Provider Notes (Signed)
[facility]-EMER[facility]PT Provider Note   CSN: [id number] Arrival date & time: 03/20/18  1435     History   Chief Complaint Chief Complaint  Patient presents with  . Cough  . URI    HPI Heidi Estes is a 22 y.o. female.  HPI   Heidi Estes is a 22 y.o. female, with a history of syphilis, presenting to the ED with cough, congestion, and rhinorrhea for the past several days. She endorses some nausea and an episode of vomiting a couple days ago, but none since. She is requesting a check of her hemoglobin as she feels as though it might be low because she feels cold in her house.  She is also asking for blood work for HIV and syphilis.  She states she is scheduled to go to the [place] on November 14 for surgery, they requested some of these labs, and she would like to know they are good before she goes.  Denies chest pain, shortness of breath, fever, abdominal pain, rash, abnormal vaginal discharge or bleeding, urinary symptoms, diarrhea, or any other complaints.      Past Medical History:  Diagnosis Date  . Multiple allergies   . Postpartum depression   . STD (female)     Patient Active Problem List   Diagnosis Date Noted  . Post term pregnancy, 41 weeks 03/04/2015  . 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 WISDOM TOOTH EXTRACTION Bilateral 07/2013   x4     OB History    Gravida  2   Para  2   Term  2   Preterm  0   AB      Living  2     SAB      TAB      Ectopic      Multiple  0   Live Births  2            Home Medications    Prior to Admission medications   Medication Sig Start Date End Date Taking? Authorizing Provider  benzonatate (TESSALON) 100 MG capsule Take 1 capsule (100 mg total) by mouth every 8 (eight) hours. 03/20/18   Joy, Shawn C, PA-C  benzoyl peroxide 5 % external liquid Apply topically 2 (two) times  daily. 02/20/18   Wieters, Hallie C, PA-C  fluticasone (FLONASE) 50 MCG/ACT nasal spray Place 2 sprays into both nostrils daily. 03/20/18   Joy, Shawn C, PA-C  hydrocortisone cream 1 % Apply to rash area 2 times daily 12/27/17   Roxy Horseman, PA-C  hydroxypropyl methylcellulose / hypromellose (ISOPTO TEARS / GONIOVISC) 2.5 % ophthalmic solution Place 1 drop into both eyes 3 (three) times daily as needed for dry eyes.    [provider]  ondansetron (ZOFRAN ODT) 4 MG disintegrating tablet Take 1 tablet (4 mg total) by mouth every 8 (eight) hours as needed for nausea or vomiting. 03/20/18   Joy, Hillard Danker, PA-C    Family History Family History  Problem Relation Age of Onset  . Asthma Mother   . Hypertension Mother   . Hypertension Maternal Aunt     Social History Social History   Tobacco Use  . Smoking status: Never Smoker  . Smokeless tobacco: Never Used  Substance Use Topics  . Alcohol use: Yes    Frequency: Never    Comment: Occassional   . Drug use: No  Allergies   Macrobid [nitrofurantoin monohyd macro]   Review of Systems Review of Systems  Constitutional: Negative for fever.  HENT: Positive for congestion and rhinorrhea. Negative for sore throat and trouble swallowing.   Respiratory: Positive for cough. Negative for shortness of breath.   Cardiovascular: Negative for chest pain and leg swelling.  Gastrointestinal: Positive for nausea (resolved) and vomiting (once). Negative for abdominal pain and diarrhea.  Genitourinary: Negative for dysuria, vaginal bleeding and vaginal discharge.  Musculoskeletal: Negative for neck pain and neck stiffness.  Skin: Negative for rash.  Neurological: Negative for dizziness, weakness, light-headedness, numbness and headaches.     Physical Exam Updated Vital Signs BP (!) 148/79 (BP Location: Right Arm)   Pulse 87   Temp 98.3 F (36.8 C) (Oral)   Resp 16   LMP 03/20/2018   SpO2 100%   Physical Exam  Constitutional:  She appears well-developed and well-nourished. No distress.  HENT:  Head: Normocephalic and atraumatic.  Nose: Mucosal edema and rhinorrhea present.  Eyes: Conjunctivae are normal.  Neck: Neck supple.  Cardiovascular: Normal rate, regular rhythm and intact distal pulses.  Pulmonary/Chest: Effort normal and breath sounds normal. No respiratory distress.  Abdominal: Soft. There is no tenderness. There is no guarding.  Musculoskeletal: She exhibits no edema.  Lymphadenopathy:    She has no cervical adenopathy.  Neurological: She is alert.  Skin: Skin is warm and dry. She is not diaphoretic.  Psychiatric: She has a normal mood and affect. Her behavior is normal.  Nursing note and vitals reviewed.    ED Treatments / Results  Labs (all labs ordered are listed, but only abnormal results are displayed) Labs Reviewed  I-STAT CHEM 8, ED - Abnormal; Notable for the following components:      Result Value   Glucose, Bld 124 (*)    All other components within normal limits  RPR  HIV ANTIBODY (ROUTINE TESTING W REFLEX)  I-STAT BETA HCG BLOOD, ED ([facility], [facility], [facility] ONLY)    EKG None  Radiology No results found.  Procedures Procedures (including critical care time)  Medications Ordered in ED Medications - No data to display   Initial Impression / Assessment and Plan / ED Course  I have reviewed the triage vital signs and the nursing notes.  Pertinent labs & imaging results that were available during my care of the patient were reviewed by me and considered in my medical decision making (see chart for details).     Patient presents with cough and upper respiratory symptoms.  Hemoglobin was tested, as requested, and found to be within normal range.  HIV and RPR results pending.  Patient advised to go to her PCP for future lab testing of this type. The patient was given instructions for home care of her cough and upper respiratory symptoms as well as return precautions. Patient voices  understanding of these instructions, accepts the plan, and is comfortable with discharge.  Final Clinical Impressions(s) / ED Diagnoses   Final diagnoses:  Cough  Nasal congestion    ED Discharge Orders         Ordered    benzonatate (TESSALON) 100 MG capsule  Every 8 hours     03/20/18 1527    fluticasone (FLONASE) 50 MCG/ACT nasal spray  Daily     03/20/18 1527    ondansetron (ZOFRAN ODT) 4 MG disintegrating tablet  Every 8 hours PRN     03/20/18 1527           Joy, Jameson  C, PA-C 03/20/18 1554    Lorre Nick, MD 03/23/18 [phone]

## 2018-03-20 NOTE — Discharge Instructions (Addendum)
Your symptoms are likely consistent with a viral illness. Viruses do not require or respond to antibiotics. Treatment is symptomatic care and it is important to note that these symptoms may last for 7-14 days.   Your hemoglobin today  Hand washing: Wash your hands throughout the day, but especially before and after touching the face, using the restroom, sneezing, coughing, or touching surfaces that have been coughed or sneezed upon. Hydration: Symptoms will be intensified and complicated by dehydration. Dehydration can also extend the duration of symptoms. Drink plenty of fluids and get plenty of rest. You should be drinking at least half a liter of water an hour to stay hydrated. Electrolyte drinks (ex. Gatorade, Powerade, Pedialyte) are also encouraged. You should be drinking enough fluids to make your urine light yellow, almost clear. If this is not the case, you are not drinking enough water. Please note that some of the treatments indicated below will not be effective if you are not adequately hydrated. Diet: Please concentrate on hydration, however, you may introduce food slowly.  Start with a clear liquid diet, progressed to a full liquid diet, and then bland solids as you are able. Pain or fever: Ibuprofen, Naproxen, or acetaminophen (generic for Tylenol) for pain or fever.  Nausea/vomiting: Use the ondansetron (generic for Zofran) for nausea or vomiting.  Cough: Use the benzonatate (generic for Tessalon) for cough.  Zyrtec or Claritin: May add these medication daily to control underlying symptoms of congestion, sneezing, and other signs of allergies.  These medications are available over-the-counter. Generics: Cetirizine (generic for Zyrtec) and loratadine (generic for Claritin). Fluticasone: Use fluticasone (generic for Flonase), as directed, for nasal and sinus congestion.  This medication is available over-the-counter. Congestion: Plain guaifenesin (generic for plain Mucinex) may help  relieve congestion. Saline sinus rinses and saline nasal sprays may also help relieve congestion. If you do not have heart problems or an allergy to such medications, you may also try phenylephrine or Sudafed. Sore throat: Warm liquids or Chloraseptic spray may help soothe a sore throat. Gargle twice a day with a salt water solution made from a half teaspoon of salt in a cup of warm water.  Follow up: Follow up with a primary care provider within the next two weeks should symptoms fail to resolve. Return: Return to the ED for significantly worsening symptoms, shortness of breath, persistent vomiting, large amounts of blood in stool, or any other major concerns.  For prescription assistance, may try using prescription discount sites or apps, such as goodrx.com

## 2018-03-21 LAB — HIV ANTIBODY (ROUTINE TESTING W REFLEX): HIV SCREEN 4TH GENERATION: NONREACTIVE

## 2018-03-22 LAB — RPR, QUANT+TP ABS (REFLEX): T Pallidum Abs: POSITIVE — AB

## 2018-03-22 LAB — RPR: RPR: REACTIVE — AB

## 2018-04-26 ENCOUNTER — Other Ambulatory Visit: Payer: Self-pay

## 2018-04-26 ENCOUNTER — Emergency Department (HOSPITAL_COMMUNITY)
Admission: EM | Admit: 2018-04-26 | Discharge: 2018-04-26 | Disposition: A | Discharge status: Home / Self Care | Payer: BLUE CROSS/BLUE SHIELD | Attending: Emergency Medicine | Admitting: Emergency Medicine

## 2018-04-26 ENCOUNTER — Encounter (HOSPITAL_COMMUNITY): Payer: Self-pay | Admitting: Emergency Medicine

## 2018-04-26 DIAGNOSIS — Z79899 Other long term (current) drug therapy: Secondary | ICD-10-CM | POA: Insufficient documentation

## 2018-04-26 DIAGNOSIS — Z9889 Other specified postprocedural states: Secondary | ICD-10-CM | POA: Diagnosis not present

## 2018-04-26 DIAGNOSIS — A539 Syphilis, unspecified: Secondary | ICD-10-CM | POA: Diagnosis not present

## 2018-04-26 DIAGNOSIS — Z202 Contact with and (suspected) exposure to infections with a predominantly sexual mode of transmission: Secondary | ICD-10-CM | POA: Diagnosis present

## 2018-04-26 LAB — POC URINE PREG, ED: PREG TEST UR: NEGATIVE

## 2018-04-26 MED ORDER — PENICILLIN G BENZATHINE 1200000 UNIT/2ML IM SUSP
2.4000 10*6.[IU] | Freq: Once | INTRAMUSCULAR | Status: AC
  Administered 2018-04-26: 2.4 10*6.[IU] via INTRAMUSCULAR
  Filled 2018-04-26: qty 4

## 2018-04-26 NOTE — ED Triage Notes (Signed)
Pt adds that she has got needles and tried draining fluid herself at home.

## 2018-04-26 NOTE — Discharge Instructions (Addendum)
You were seen in the ER for treatment for syphilis.  You were given a shot to treat this infection.  There is no further treatment needed.  You need to contact all of your sexual partners and notify them of your test results as they will need treatment and testing.  You need to follow-up with general surgery for further, formal evaluation of your tummy tuck incision.  Do not poke or drain the incision.  Monitor for signs of infection, return for fever, redness, warmth, discharge, dehiscence of the wound.

## 2018-04-26 NOTE — ED Triage Notes (Signed)
Pt went to get tummy tuck done November 14 in [place] and was told she was positive syphilis and needs to be treated for it. Also reports that after surgery she has big pouch of fluid in lower abd and has called around and no doctor will see her since surgery was [place] out of country.

## 2018-04-26 NOTE — ED Provider Notes (Signed)
[facility]-EMERGENCY DEPT Provider Note   CSN: [id number] Arrival date & time: 04/26/18  1759     History   Chief Complaint Chief Complaint  Patient presents with  . Exposure to STD  . Post-op Problem    HPI Waldron LabsJustasia D Katrinka BlazingSmith is a 22 y.o. female with history of STDs including syphilis, is here for treatment of syphilis.  Patient states that she came to ER in October for STD testing, she was notified recently that she tested positive for syphilis.  She was notified of positive test result while she was in the [place]. She was in the [place] to get a tummy tuck.  Reportedly, staff in [place] got blood work and confirmed syphilis diagnosis. She states they gave her a prescription for amoxicillin but no penicillin shot. She is concerned she was not treated appropriate for syphilis.  She denies any fevers, chills, vaginal discharge, abnormal vaginal bleeding, dysuria, hematuria, urgency, frequency.  LMP 2 weeks ago.  She is sexually active with inconsistent use of condoms with female partners.  Since testing she has been sexually active with condom use only.   Patient's tummy tuck was done November 14.  Reports that since surgery she has had "a big pouch" of fluid in her lower abdomen along the incision line.  There is mild pain to this area with movement and deep pressure.  She has tried to poke the area with a needle but states that she is only had a little bit of blood come out.  She went to a "lymph" massage specialist who poked the area and drained some fluid out here in [place].  She states she cannot afford to go back there and get it drained.  She attempted to make an appointment with the plastic surgeon but she was told that they would not intervene as her surgery was not [place] the country.  She denies any redness, pus from the incision line.  HPI  Past Medical History:  Diagnosis Date  . Multiple allergies   . Postpartum depression   . STD  (female)     Patient Active Problem List   Diagnosis Date Noted  . Post term pregnancy, 41 weeks 03/04/2015  . 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 WISDOM TOOTH EXTRACTION Bilateral 07/2013   x4     OB History    Gravida  2   Para  2   Term  2   Preterm  0   AB      Living  2     SAB      TAB      Ectopic      Multiple  0   Live Births  2            Home Medications    Prior to Admission medications   Medication Sig Start Date End Date Taking? Authorizing Provider  hydroxypropyl methylcellulose / hypromellose (ISOPTO TEARS / GONIOVISC) 2.5 % ophthalmic solution Place 1 drop into both eyes 3 (three) times daily as needed for dry eyes.   Yes [provider]  benzonatate (TESSALON) 100 MG capsule Take 1 capsule (100 mg total) by mouth every 8 (eight) hours. Patient not taking: Reported on 04/26/2018 03/20/18   Harolyn RutherfordJoy, Shawn C, PA-C  benzoyl peroxide 5 % external liquid Apply topically 2 (two) times daily. Patient not taking: Reported on 04/26/2018 02/20/18   Patterson HammersmithWieters, Hallie  C, PA-C  fluticasone (FLONASE) 50 MCG/ACT nasal spray Place 2 sprays into both nostrils daily. Patient not taking: Reported on 04/26/2018 03/20/18   Anselm Pancoast, PA-C  hydrocortisone cream 1 % Apply to rash area 2 times daily Patient not taking: Reported on 04/26/2018 12/27/17   Roxy Horseman, PA-C  ondansetron (ZOFRAN ODT) 4 MG disintegrating tablet Take 1 tablet (4 mg total) by mouth every 8 (eight) hours as needed for nausea or vomiting. Patient not taking: Reported on 04/26/2018 03/20/18   Anselm Pancoast, PA-C    Family History Family History  Problem Relation Age of Onset  . Asthma Mother   . Hypertension Mother   . Hypertension Maternal Aunt     Social History Social History   Tobacco Use  . Smoking status: Never Smoker  . Smokeless tobacco: Never Used  Substance Use Topics  . Alcohol use: Yes     Frequency: Never    Comment: Occassional   . Drug use: No     Allergies   Macrobid [nitrofurantoin monohyd macro]   Review of Systems Review of Systems  Skin: Positive for wound (surgical incision; "fluid").  All other systems reviewed and are negative.    Physical Exam Updated Vital Signs BP 110/70 (BP Location: Left Arm)   Pulse 86   Temp 98.6 F (37 C) (Oral)   Resp 16   LMP 04/19/2018   SpO2 100%   Physical Exam  Constitutional: She is oriented to person, place, and time. She appears well-developed and well-nourished.  Non toxic  HENT:  Head: Normocephalic and atraumatic.  Nose: Nose normal.  Eyes: Pupils are equal, round, and reactive to light. Conjunctivae and EOM are normal.  Neck: Normal range of motion.  Cardiovascular: Normal rate and regular rhythm.  Pulmonary/Chest: Effort normal and breath sounds normal.  Abdominal: Soft. Bowel sounds are normal. There is no tenderness.  No G/R/R. No suprapubic or CVA tenderness. Negative Murphy's and McBurney's. Active BS to lower quadrants.   Musculoskeletal: Normal range of motion.  Neurological: She is alert and oriented to person, place, and time.  Skin: Skin is warm and dry. Capillary refill takes less than 2 seconds.  Large incision across lower abdomen horizontally, appears to be healing well. No erythema, edema, induration, warmth, discharge. No tenderness. I do not appreciate focal area of fluid collection to abdomen.   Psychiatric: She has a normal mood and affect. Her behavior is normal.  Nursing note and vitals reviewed.      ED Treatments / Results  Labs (all labs ordered are listed, but only abnormal results are displayed) Labs Reviewed  RPR  HIV ANTIBODY (ROUTINE TESTING W REFLEX)  POC URINE PREG, ED    EKG None  Radiology No results found.  Procedures Procedures (including critical care time)  Medications Ordered in ED Medications  penicillin g benzathine (BICILLIN LA) 1200000  UNIT/2ML injection 2.4 Million Units (2.4 Million Units Intramuscular Given 04/26/18 2043)     Initial Impression / Assessment and Plan / ED Course  I have reviewed the triage vital signs and the nursing notes.  Pertinent labs & imaging results that were available during my care of the patient were reviewed by me and considered in my medical decision making (see chart for details).    22 year old here for treatment of syphilis.  I reviewed her chart, she tested positive in October.  Since positive results she has been sexually active but with condom use.  She denies any vaginal discharge, dysuria, hematuria,  genital lesions.  Given known positive result and no unprotected sexual encounters we will defer repeat pelvic exam today.  She is comfortable with this.  We will give penicillin.  I advised her to contact her partners to notify them of positive test results.  HIV sent as well.  In regards to her surgical incision.  Picture above.  No signs of infection, dehiscence.  The area is soft without any induration or obvious localized fluid collection.  She has no fever.  Normal bowel movements.  I do not think there is need for emergent imaging or lab work.  I stressed importance of avoiding continued drainage with needles.  She is to follow-up with [facility] surgery for formal surgical evaluation.  Return precautions discussed.  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

## 2018-04-27 LAB — RPR: RPR Ser Ql: NONREACTIVE

## 2018-04-27 LAB — HIV ANTIBODY (ROUTINE TESTING W REFLEX): HIV Screen 4th Generation wRfx: NONREACTIVE

## 2018-12-05 ENCOUNTER — Emergency Department (HOSPITAL_COMMUNITY)
Admission: EM | Admit: 2018-12-05 | Discharge: 2018-12-05 | Disposition: A | Discharge status: Home / Self Care | Payer: BC Managed Care – PPO | Attending: Emergency Medicine | Admitting: Emergency Medicine

## 2018-12-05 ENCOUNTER — Encounter (HOSPITAL_COMMUNITY): Payer: Self-pay

## 2018-12-05 ENCOUNTER — Other Ambulatory Visit: Payer: Self-pay

## 2018-12-05 DIAGNOSIS — N760 Acute vaginitis: Secondary | ICD-10-CM | POA: Insufficient documentation

## 2018-12-05 DIAGNOSIS — N898 Other specified noninflammatory disorders of vagina: Secondary | ICD-10-CM | POA: Diagnosis present

## 2018-12-05 DIAGNOSIS — B9689 Other specified bacterial agents as the cause of diseases classified elsewhere: Secondary | ICD-10-CM

## 2018-12-05 LAB — URINALYSIS, ROUTINE W REFLEX MICROSCOPIC
Bilirubin Urine: NEGATIVE
Glucose, UA: NEGATIVE mg/dL
Hgb urine dipstick: NEGATIVE
Ketones, ur: NEGATIVE mg/dL
Leukocytes,Ua: NEGATIVE
Nitrite: NEGATIVE
Protein, ur: NEGATIVE mg/dL
Specific Gravity, Urine: 1.018 (ref 1.005–1.030)
pH: 7 (ref 5.0–8.0)

## 2018-12-05 LAB — WET PREP, GENITAL
Sperm: NONE SEEN
Trich, Wet Prep: NONE SEEN
Yeast Wet Prep HPF POC: NONE SEEN

## 2018-12-05 LAB — POC URINE PREG, ED: Preg Test, Ur: NEGATIVE

## 2018-12-05 MED ORDER — METRONIDAZOLE 500 MG PO TABS: 500.0000 mg | ORAL_TABLET | Freq: Two times a day (BID) | ORAL | 0 refills | Status: DC

## 2018-12-05 NOTE — ED Triage Notes (Signed)
States for about 2 months vaginal discharge brownish in color and burning and itching with bumps in vaginal area states she had unprotected sex and then all this started and also has bump in mouth that started the same time no dysuria voiced.

## 2018-12-05 NOTE — ED Provider Notes (Signed)
[facility]-EMERGENCY DEPT Provider Note   CSN: [id number] Arrival date & time: 12/05/18  1119     History   Chief Complaint Chief Complaint  Patient presents with  . Vaginal Discharge    HPI Heidi Estes LabsJustasia D Heidi Estes is a 23 y.o. female.     The history is provided by the patient and medical records. No language interpreter was used.  Vaginal Discharge    23 year old female with prior history of STI presenting with complaints of vaginal discharge.  Patient report for the past 2 months she has had persistent vaginal odor.  She also endorsed occasional brownish vaginal discharge.  She does not associate any significant pain.  She does not complain of any fever.  She endorsed having a recurrent rash to her bilateral buttock region that is mildly itchy.  She admits to having sexual partner 3 to 4 days ago using protection.  She has tested positive for syphilis in the past.  She denies any change in her body wash and does not douche.  Her primary concern is the foul odor.  She denies any pain with sex activities.  No abdominal pain nausea vomiting diarrhea or dysuria.  Past Medical History:  Diagnosis Date  . Multiple allergies   . Postpartum depression   . STD (female)     Patient Active Problem List   Diagnosis Date Noted  . Post term pregnancy, 41 weeks 03/04/2015  . 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 WISDOM TOOTH EXTRACTION Bilateral 07/2013   x4     OB History    Gravida  2   Para  2   Term  2   Preterm  0   AB      Living  2     SAB      TAB      Ectopic      Multiple  0   Live Births  2            Home Medications    Prior to Admission medications   Medication Sig Start Date End Date Taking? Authorizing Provider  benzonatate (TESSALON) 100 MG capsule Take 1 capsule (100 mg total) by mouth every 8 (eight) hours. Patient not taking: Reported on  04/26/2018 03/20/18   Harolyn RutherfordJoy, Shawn C, PA-C  benzoyl peroxide 5 % external liquid Apply topically 2 (two) times daily. Patient not taking: Reported on 04/26/2018 02/20/18   Wieters, Hallie C, PA-C  fluticasone (FLONASE) 50 MCG/ACT nasal spray Place 2 sprays into both nostrils daily. Patient not taking: Reported on 04/26/2018 03/20/18   Anselm PancoastJoy, Shawn C, PA-C  hydrocortisone cream 1 % Apply to rash area 2 times daily Patient not taking: Reported on 04/26/2018 12/27/17   Roxy HorsemanBrowning, Robert, PA-C  hydroxypropyl methylcellulose / hypromellose (ISOPTO TEARS / GONIOVISC) 2.5 % ophthalmic solution Place 1 drop into both eyes 3 (three) times daily as needed for dry eyes.    [provider]  ondansetron (ZOFRAN ODT) 4 MG disintegrating tablet Take 1 tablet (4 mg total) by mouth every 8 (eight) hours as needed for nausea or vomiting. Patient not taking: Reported on 04/26/2018 03/20/18   Anselm PancoastJoy, Shawn C, PA-C    Family History Family History  Problem Relation Age of Onset  . Asthma Mother   . Hypertension Mother   . Hypertension Maternal Aunt     Social History Social History   Tobacco Use  . Smoking  status: Never Smoker  . Smokeless tobacco: Never Used  Substance Use Topics  . Alcohol use: Yes    Frequency: Never    Comment: Occassional   . Drug use: No     Allergies   Macrobid [nitrofurantoin monohyd macro]   Review of Systems Review of Systems  Genitourinary: Positive for vaginal discharge.  All other systems reviewed and are negative.    Physical Exam Updated Vital Signs BP 125/89   Pulse 94   Temp 99.2 F (37.3 C) (Oral)   Resp 14   Ht 5\' 1"  (1.549 m)   Wt 79.8 kg   SpO2 100%   BMI 33.25 kg/m   Physical Exam Vitals signs and nursing note reviewed.  Constitutional:      General: She is not in acute distress.    Appearance: She is well-developed. She is obese.  HENT:     Head: Atraumatic.  Eyes:     Conjunctiva/sclera: Conjunctivae normal.  Neck:     Musculoskeletal:  Neck supple.  Cardiovascular:     Rate and Rhythm: Normal rate and regular rhythm.     Pulses: Normal pulses.     Heart sounds: Normal heart sounds.  Pulmonary:     Effort: Pulmonary effort is normal.     Breath sounds: Normal breath sounds. No wheezing or rhonchi.  Abdominal:     General: Abdomen is flat.     Palpations: Abdomen is soft.     Tenderness: There is no abdominal tenderness.  Genitourinary:    Comments: Pelvic exam performed with permission of pt and female ED tach assist during exam.  External genitalia w/out lesions.  Vaginal vault with normal functional discharge.  Cervix w/out lesions, not friable, GC/Chlamydia and wet prep obtained and sent to lab.  Bimanual exam w/out CMT, uterine or adnexal tenderness  Skin:    Findings: No rash.     Comments: No appreciable rash noted on body, specifically no concerning rash on buttock.   Neurological:     Mental Status: She is alert.      ED Treatments / Results  Labs (all labs ordered are listed, but only abnormal results are displayed) Labs Reviewed  WET PREP, GENITAL - Abnormal; Notable for the following components:      Result Value   Clue Cells Wet Prep HPF POC PRESENT (*)    WBC, Wet Prep HPF POC MANY (*)    All other components within normal limits  URINALYSIS, ROUTINE W REFLEX MICROSCOPIC  RPR  HIV ANTIBODY (ROUTINE TESTING W REFLEX)  POC URINE PREG, ED  GC[facility]CHLAM[facility]DIA PROBE AMP [facility] [place]    EKG None  Radiology No results found.  Procedures Procedures (including critical care time)  Medications Ordered in ED Medications - No data to display   Initial Impression / Assessment and Plan / ED Course  I have reviewed the triage vital signs and the nursing notes.  Pertinent labs & imaging results that were available during my care of the patient were reviewed by me and considered in my medical decision making (see chart for details).        BP 117/72   Pulse 88   Temp 99.2 F (37.3  C) (Oral)   Resp 14   Ht 5\' 1"  (1.549 m)   Wt 79.8 kg   SpO2 100%   BMI 33.25 kg/m    Final Clinical Impressions(s) / ED Diagnoses   Final diagnoses:  BV (bacterial vaginosis)    ED Discharge  Orders         Ordered    metroNIDAZOLE (FLAGYL) 500 MG tablet  2 times daily     12/05/18 1314         11:47 AM Pt here with c/o vaginal odor and occasional vaginal discharge.  No abd pain.  Work up initiated.   Plan examination without any concern for PID.  I also examined patient scans and I did not appreciate any concerning rash.  1:13 PM Prep with evidence of clue cells and many WBC.  Plan to treat patient for bacterial vaginosis with Flagyl. Pregnancy test negative.    Domenic Moras, PA-C 12/05/18 1319    Milton Ferguson, MD 12/05/18 [phone]

## 2018-12-06 LAB — RPR: RPR Ser Ql: NONREACTIVE

## 2018-12-06 LAB — HIV ANTIBODY (ROUTINE TESTING W REFLEX): HIV Screen 4th Generation wRfx: NONREACTIVE

## 2018-12-09 LAB — GC/CHLAMYDIA PROBE AMP (~~LOC~~) NOT AT ARMC
Chlamydia: NEGATIVE
Neisseria Gonorrhea: NEGATIVE

## 2019-03-23 ENCOUNTER — Encounter (HOSPITAL_COMMUNITY): Payer: Self-pay

## 2019-03-23 ENCOUNTER — Other Ambulatory Visit: Payer: Self-pay

## 2019-03-23 DIAGNOSIS — N898 Other specified noninflammatory disorders of vagina: Secondary | ICD-10-CM | POA: Diagnosis not present

## 2019-03-23 DIAGNOSIS — Z202 Contact with and (suspected) exposure to infections with a predominantly sexual mode of transmission: Secondary | ICD-10-CM | POA: Diagnosis present

## 2019-03-23 NOTE — ED Triage Notes (Signed)
Pt arrived stating her new partner tested positive for Chlamydia. Pt reports vaginal burning, denies any other symptoms or vaginal discharge at this time.

## 2019-03-24 ENCOUNTER — Emergency Department (HOSPITAL_COMMUNITY)
Admission: EM | Admit: 2019-03-24 | Discharge: 2019-03-24 | Disposition: A | Discharge status: Home / Self Care | Payer: BC Managed Care – PPO | Attending: Emergency Medicine | Admitting: Emergency Medicine

## 2019-03-24 DIAGNOSIS — N898 Other specified noninflammatory disorders of vagina: Secondary | ICD-10-CM

## 2019-03-24 DIAGNOSIS — Z202 Contact with and (suspected) exposure to infections with a predominantly sexual mode of transmission: Secondary | ICD-10-CM

## 2019-03-24 LAB — POC URINE PREG, ED: Preg Test, Ur: NEGATIVE

## 2019-03-24 LAB — URINALYSIS, ROUTINE W REFLEX MICROSCOPIC
Bilirubin Urine: NEGATIVE
Glucose, UA: NEGATIVE mg/dL
Hgb urine dipstick: NEGATIVE
Ketones, ur: 5 mg/dL — AB
Nitrite: NEGATIVE
Protein, ur: 30 mg/dL — AB
Specific Gravity, Urine: 1.032 — ABNORMAL HIGH (ref 1.005–1.030)
pH: 7 (ref 5.0–8.0)

## 2019-03-24 LAB — WET PREP, GENITAL
Clue Cells Wet Prep HPF POC: NONE SEEN
Sperm: NONE SEEN
Trich, Wet Prep: NONE SEEN
Yeast Wet Prep HPF POC: NONE SEEN

## 2019-03-24 MED ORDER — CEFTRIAXONE SODIUM 250 MG IJ SOLR
250.0000 mg | Freq: Once | INTRAMUSCULAR | Status: AC
  Administered 2019-03-24: 250 mg via INTRAMUSCULAR
  Filled 2019-03-24: qty 250

## 2019-03-24 MED ORDER — AZITHROMYCIN 250 MG PO TABS
1000.0000 mg | ORAL_TABLET | Freq: Once | ORAL | Status: AC
  Administered 2019-03-24: 1000 mg via ORAL
  Filled 2019-03-24: qty 4

## 2019-03-24 MED ORDER — STERILE WATER FOR INJECTION IJ SOLN
INTRAMUSCULAR | Status: AC
  Filled 2019-03-24: qty 10

## 2019-03-24 NOTE — ED Provider Notes (Signed)
[facility]-EMERGENCY DEPT Provider Note   CSN: [id number] Arrival date & time: 03/23/19  2218     History   Chief Complaint Chief Complaint  Patient presents with  . Exposure to STD    HPI Heidi Estes is a 23 y.o. female.     89 yy.o female with a PMH of STIs presents to the ED with a chief complaint of of vaginal discharge x a few days.  Patient reports she was recently sexually active with her partner who recently tested positive for chlamydia.  She does endorse some white milky vaginal discharge, this is worse after sexual intercourse, there is a foul smell to it.  Reports there is also some discomfort with urination, states she feels like she does not fully empty.  She does have a prior history of sexually transmitted infections, reports this feels just like when she had one in the past.  She has not tried any medication for improvement in symptoms.  She denies any fever, or abdominal pain.  Her last menstrual period was 5 days ago, she is currently on the last day of her menstrual cycle.  The history is provided by the patient.  Exposure to STD Pertinent negatives include no chest pain, no abdominal pain and no shortness of breath.    Past Medical History:  Diagnosis Date  . Multiple allergies   . Postpartum depression   . STD (female)     Patient Active Problem List   Diagnosis Date Noted  . Post term pregnancy, 41 weeks 03/04/2015  . 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 WISDOM TOOTH EXTRACTION Bilateral 07/2013   x4     OB History    Gravida  2   Para  2   Term  2   Preterm  0   AB      Living  2     SAB      TAB      Ectopic      Multiple  0   Live Births  2            Home Medications    Prior to Admission medications   Medication Sig Start Date End Date Taking? Authorizing Provider  metroNIDAZOLE (FLAGYL) 500 MG tablet Take 1  tablet (500 mg total) by mouth 2 (two) times daily. One po bid x 7 days 12/05/18   Fayrene Helperran, [place], PA-C    Family History Family History  Problem Relation Age of Onset  . Asthma Mother   . Hypertension Mother   . Hypertension Maternal Aunt     Social History Social History   Tobacco Use  . Smoking status: Never Smoker  . Smokeless tobacco: Never Used  Substance Use Topics  . Alcohol use: Yes    Frequency: Never    Comment: Occassional   . Drug use: No     Allergies   Macrobid [nitrofurantoin monohyd macro]   Review of Systems Review of Systems  Constitutional: Negative for fever.  HENT: Negative for sore throat.   Respiratory: Negative for shortness of breath.   Cardiovascular: Negative for chest pain.  Gastrointestinal: Negative for abdominal pain, diarrhea, nausea and vomiting.  Genitourinary: Positive for dysuria and vaginal discharge. Negative for pelvic pain and vaginal pain.  Musculoskeletal: Negative for back pain.  Skin: Negative for pallor and wound.  Neurological: Negative for light-headedness.     Physical Exam Updated  Vital Signs BP 134/78 (BP Location: Left Arm)   Pulse 75   Temp 98.5 F (36.9 C) (Oral)   Resp 14   Ht 5\' 1"  (1.549 m)   Wt 79.4 kg   LMP 03/23/2019   SpO2 99%   BMI 33.07 kg/m   Physical Exam Vitals signs and nursing note reviewed. Exam conducted with a chaperone present.  Constitutional:      General: She is not in acute distress.    Appearance: Normal appearance. She is well-developed.  HENT:     Head: Normocephalic and atraumatic.     Mouth/Throat:     Pharynx: No oropharyngeal exudate.  Eyes:     Pupils: Pupils are equal, round, and reactive to light.  Neck:     Musculoskeletal: Normal range of motion.  Cardiovascular:     Rate and Rhythm: Regular rhythm.     Heart sounds: Normal heart sounds.  Pulmonary:     Effort: Pulmonary effort is normal. No respiratory distress.     Breath sounds: Normal breath sounds.   Abdominal:     General: Bowel sounds are normal. There is no distension.     Palpations: Abdomen is soft.     Tenderness: There is no abdominal tenderness.  Genitourinary:    Exam position: Supine.     Pubic Area: No rash.      Labia:        Right: No rash.        Left: No rash.      Vagina: Vaginal discharge and bleeding present. No erythema.     Cervix: No cervical motion tenderness.     Adnexa: Right adnexa normal and left adnexa normal.       Right: No tenderness.         Left: No tenderness.       Comments: Slight dark brown blood noted on cervical wall, there is significant amount of white discharge noted.  No CMT, no adnexal tenderness. Chaperoned by NT.  Musculoskeletal:        General: No tenderness or deformity.     Right lower leg: No edema.     Left lower leg: No edema.  Skin:    General: Skin is warm and dry.  Neurological:     Mental Status: She is alert and oriented to person, place, and time.      ED Treatments / Results  Labs (all labs ordered are listed, but only abnormal results are displayed) Labs Reviewed  WET PREP, GENITAL - Abnormal; Notable for the following components:      Result Value   WBC, Wet Prep HPF POC MANY (*)    All other components within normal limits  URINALYSIS, ROUTINE W REFLEX MICROSCOPIC - Abnormal; Notable for the following components:   Specific Gravity, Urine 1.032 (*)    Ketones, ur 5 (*)    Protein, ur 30 (*)    Leukocytes,Ua TRACE (*)    Bacteria, UA RARE (*)    All other components within normal limits  POC URINE PREG, ED  GC/CHLAMYDIA PROBE AMP ([place]) NOT AT [facility]    EKG None  Radiology No results found.  Procedures Procedures (including critical care time)  Medications Ordered in ED Medications  azithromycin (ZITHROMAX) tablet 1,000 mg (has no administration in time range)  cefTRIAXone (ROCEPHIN) injection 250 mg (has no administration in time range)     Initial Impression / Assessment and Plan /  ED Course  I have reviewed the triage  vital signs and the nursing notes.  Pertinent labs & imaging results that were available during my care of the patient were reviewed by me and considered in my medical decision making (see chart for details).  Clinical Course as of Mar 23 245  Wed Mar 24, 2019  0240 Bacteria, UA(!): RARE [JS]  0240 Leukocytes,Ua(!): TRACE [JS]    Clinical Course User Index [JS] Janeece Fitting, PA-C     Patient with a past medical history of psychiatric infection presents to the ED with vaginal discharge for the past couple days.  Patient reports she was sexually active with her partner who recently tested positive for chlamydia.  She endorses a white milky discharge with a foul odor, has no abdominal pain, fevers, difficulty urinating.  She does report some discomfort with urination which she feels like she is not fully emptying. Her last menstrual period ended today, there is some bleeding noted to the cervix, during my evaluation there is discharge coming from the cervix, this is dark brown in nature.  There is no cervical motion tenderness, no adnexa tenderness.  She denies any abdominal pain.   Pelvic exam performed with nurse tech at the bedside.  Obtain a wet prep, GC along with a UA for further evaluation. UA had a trace of leukocytes, white blood cell counts are 6-10, suspect that this is likely contaminated as a squamous count was high.  Wet prep had many white blood cell count, no trichomonas or clue cells present.  hCG is negative.  Patient will be treated today for gonorrhea and chlamydia as she reports a positive exposure to chlamydia.  Advised to follow-up with PCP when any other concerns.  Patient shows and agrees with management, patient stable for discharge.   Portions of this note were generated with [employer]. Dictation errors may occur despite best attempts at proofreading.  Final Clinical Impressions(s) / ED Diagnoses   Final  diagnoses:  Vaginal discharge  STD exposure    ED Discharge Orders    None       Janeece Fitting, PA-C 03/24/19 0247    Fatima Blank, MD 03/24/19 [phone]

## 2019-03-24 NOTE — Discharge Instructions (Addendum)
Today you were treated for gonorrhea and chlamydia.  Please refrain from sexual intercourse for the next 10 days.  Follow-up with your PCP as needed.

## 2019-03-25 LAB — GC/CHLAMYDIA PROBE AMP (~~LOC~~) NOT AT ARMC
Chlamydia: NEGATIVE
Neisseria Gonorrhea: NEGATIVE

## 2019-10-08 ENCOUNTER — Emergency Department (HOSPITAL_COMMUNITY)
Admission: EM | Admit: 2019-10-08 | Discharge: 2019-10-08 | Disposition: A | Discharge status: Home / Self Care | Payer: BC Managed Care – PPO | Attending: Emergency Medicine | Admitting: Emergency Medicine

## 2019-10-08 ENCOUNTER — Encounter (HOSPITAL_COMMUNITY): Payer: Self-pay

## 2019-10-08 DIAGNOSIS — N898 Other specified noninflammatory disorders of vagina: Secondary | ICD-10-CM | POA: Diagnosis present

## 2019-10-08 DIAGNOSIS — R102 Pelvic and perineal pain: Secondary | ICD-10-CM | POA: Diagnosis not present

## 2019-10-08 DIAGNOSIS — R300 Dysuria: Secondary | ICD-10-CM | POA: Diagnosis not present

## 2019-10-08 DIAGNOSIS — Z113 Encounter for screening for infections with a predominantly sexual mode of transmission: Secondary | ICD-10-CM | POA: Insufficient documentation

## 2019-10-08 DIAGNOSIS — N760 Acute vaginitis: Secondary | ICD-10-CM | POA: Diagnosis not present

## 2019-10-08 LAB — GC/CHLAMYDIA PROBE AMP (~~LOC~~) NOT AT ARMC
Chlamydia: NEGATIVE
Comment: NEGATIVE
Comment: NORMAL
Neisseria Gonorrhea: NEGATIVE

## 2019-10-08 LAB — URINALYSIS, ROUTINE W REFLEX MICROSCOPIC
Bilirubin Urine: NEGATIVE
Glucose, UA: NEGATIVE mg/dL
Hgb urine dipstick: NEGATIVE
Ketones, ur: NEGATIVE mg/dL
Nitrite: NEGATIVE
Protein, ur: NEGATIVE mg/dL
Specific Gravity, Urine: 1.016 (ref 1.005–1.030)
pH: 7 (ref 5.0–8.0)

## 2019-10-08 LAB — I-STAT BETA HCG BLOOD, ED (MC, WL, AP ONLY): I-stat hCG, quantitative: <5 m[IU]/mL (ref <5)

## 2019-10-08 LAB — WET PREP, GENITAL
Sperm: NONE SEEN
Trich, Wet Prep: NONE SEEN
WBC, Wet Prep HPF POC: NONE SEEN
Yeast Wet Prep HPF POC: NONE SEEN

## 2019-10-08 MED ORDER — METRONIDAZOLE 500 MG PO TABS: 500.0000 mg | ORAL_TABLET | Freq: Two times a day (BID) | ORAL | 0 refills | Status: DC

## 2019-10-08 NOTE — ED Provider Notes (Signed)
[facility]-EMERGENC[facility] DEPT Provider Note  CSN: [id number] Arrival date & time: 10/08/19 0143  Chief Complaint(s) Vaginal Discharge  HPI Heidi Estes is a 24 y.o. female who presents to the emergency department with 3 to 4 weeks of vaginal discharge.  Patient reports being sexually active with 2 different partners during that time.  She is endorsing mild pelvic discomfort without alleviating or aggravating factors..  She also endorsing dysuria.  No nausea or vomiting.  No fevers or chills.  Last menstrual cycle was approximately 3 to 4 weeks ago.  No vaginal bleeding.  No other physical complaints  HPI  Past Medical History Past Medical History:  Diagnosis Date  . Multiple allergies   . Postpartum depression   . STD (female)    Patient Active Problem List   Diagnosis Date Noted  . Post term pregnancy, 41 weeks 03/04/2015  . Obesity affecting pregnancy in third trimester, antepartum    Home Medication(s) Prior to Admission medications   Medication Sig Start Date End Date Taking? Authorizing Provider  metroNIDAZOLE (FLAGYL) 500 MG tablet Take 1 tablet (500 mg total) by mouth 2 (two) times daily. One po bid x 7 days 10/08/19   Nira Conn, MD                                                                                                                                    Past Surgical History Past Surgical History:  Procedure Laterality Date  . WISDOM TOOTH EXTRACTION Bilateral 07/2013   x4   Family History Family History  Problem Relation Age of Onset  . Asthma Mother   . Hypertension Mother   . Hypertension Maternal Aunt     Social History Social History   Tobacco Use  . Smoking status: Never Smoker  . Smokeless tobacco: Never Used  Substance Use Topics  . Alcohol use: Yes    Comment: Occassional   . Drug use: No   Allergies Macrobid [nitrofurantoin monohyd macro]  Review of Systems Review of Systems All other systems are  reviewed and are negative for acute change except as noted in the HPI  Physical Exam Vital Signs  I have reviewed the triage vital signs BP 125/76   Pulse 79   Temp 98.4 F (36.9 C) (Oral)   Resp 16   SpO2 99%   Physical Exam Vitals reviewed. Exam conducted with a chaperone present.  Constitutional:      General: She is not in acute distress.    Appearance: She is well-developed. She is not diaphoretic.  HENT:     Head: Normocephalic and atraumatic.     Right Ear: External ear normal.     Left Ear: External ear normal.     Nose: Nose normal.  Eyes:     General: No scleral icterus.    Conjunctiva/sclera: Conjunctivae normal.  Neck:     Trachea: Phonation normal.  Cardiovascular:  Rate and Rhythm: Normal rate and regular rhythm.  Pulmonary:     Effort: Pulmonary effort is normal. No respiratory distress.     Breath sounds: No stridor.  Abdominal:     General: There is no distension.     Tenderness: There is no abdominal tenderness.  Genitourinary:    Pubic Area: No rash.      Labia:        Right: No rash, tenderness or lesion.        Left: No rash, tenderness or lesion.      Vagina: Vaginal discharge (mild white) present. No erythema or tenderness.     Cervix: No cervical motion tenderness, discharge or friability.     Uterus: Not tender.      Adnexa:        Right: No tenderness.         Left: No tenderness.    Musculoskeletal:        General: Normal range of motion.     Cervical back: Normal range of motion.  Neurological:     Mental Status: She is alert and oriented to person, place, and time.  Psychiatric:        Behavior: Behavior normal.     ED Results and Treatments Labs (all labs ordered are listed, but only abnormal results are displayed) Labs Reviewed  WET PREP, GENITAL - Abnormal; Notable for the following components:      Result Value   Clue Cells Wet Prep HPF POC PRESENT (*)    All other components within normal limits  URINALYSIS, ROUTINE  W REFLEX MICROSCOPIC - Abnormal; Notable for the following components:   Leukocytes,Ua TRACE (*)    Bacteria, UA RARE (*)    All other components within normal limits  I-STAT BETA HCG BLOOD, ED ([facility], [facility], [facility] ONLY)  GC/CHLAMYDIA PROBE AMP ([place]) NOT AT [facility]                                                                                                                         EKG  EKG Interpretation  Date/Time:    Ventricular Rate:    PR Interval:    QRS Duration:   QT Interval:    QTC Calculation:   R Axis:     Text Interpretation:        Radiology No results found.  Pertinent labs & imaging results that were available during my care of the patient were reviewed by me and considered in my medical decision making (see chart for details).  Medications Ordered in ED Medications - No data to display  Procedures Procedures  (including critical care time)  Medical Decision Making / ED Course I have reviewed the nursing notes for this encounter and the patient's prior records (if available in EHR or on provided paperwork).   Heidi Estes was evaluated in Emergency Department on 10/08/2019 for the symptoms described in the history of present illness. She was evaluated in the context of the global COVID-19 pandemic, which necessitated consideration that the patient might be at risk for infection with the SARS-CoV-2 virus that causes COVID-19. Institutional protocols and algorithms that pertain to the evaluation of patients at risk for COVID-19 are in a state of rapid change based on information released by regulatory bodies including the CDC and federal and state organizations. These policies and algorithms were followed during the patient's care in the ED.  Patient presents with vaginal discharge. Abdomen benign. Pelvic exam without evidence of  cervicitis or PID.  Does have scant amount of white discharge. Wet prep consistent with BV. No trich.  GC/chlamydia sent. Beta-hCG negative. UA negative     Final Clinical Impression(s) / ED Diagnoses Final diagnoses:  Bacterial vaginosis    The patient appears reasonably screened and/or stabilized for discharge and I doubt any other medical condition or other [facility] requiring further screening, evaluation, or treatment in the ED at this time prior to discharge. Safe for discharge with strict return precautions.  Disposition: Discharge  Condition: Good  I have discussed the results, Dx and Tx plan with the patient/family who expressed understanding and agree(s) with the plan. Discharge instructions discussed at length. The patient/family was given strict return precautions who verbalized understanding of the instructions. No further questions at time of discharge.    ED Discharge Orders         Ordered    metroNIDAZOLE (FLAGYL) 500 MG tablet  2 times daily     10/08/19 8563           Follow Up: Jackie Plum, MD [address] [place] [address] [phone]  Schedule an appointment as soon as possible for a visit  As needed     This chart was dictated using voice recognition software.  Despite best efforts to proofread,  errors can occur which can change the documentation meaning.   Nira Conn, MD 10/08/19 [phone]

## 2019-10-08 NOTE — ED Triage Notes (Signed)
Pt reports vaginal discharge and back pain for several days.

## 2019-12-02 ENCOUNTER — Other Ambulatory Visit: Payer: Self-pay

## 2019-12-02 ENCOUNTER — Emergency Department (HOSPITAL_COMMUNITY)
Admission: EM | Admit: 2019-12-02 | Discharge: 2019-12-02 | Disposition: A | Discharge status: Home / Self Care | Payer: Medicaid Other | Attending: Emergency Medicine | Admitting: Emergency Medicine

## 2019-12-02 DIAGNOSIS — Z113 Encounter for screening for infections with a predominantly sexual mode of transmission: Secondary | ICD-10-CM | POA: Insufficient documentation

## 2019-12-02 DIAGNOSIS — N898 Other specified noninflammatory disorders of vagina: Secondary | ICD-10-CM | POA: Diagnosis not present

## 2019-12-02 DIAGNOSIS — Z8619 Personal history of other infectious and parasitic diseases: Secondary | ICD-10-CM | POA: Diagnosis not present

## 2019-12-02 DIAGNOSIS — Z711 Person with feared health complaint in whom no diagnosis is made: Secondary | ICD-10-CM

## 2019-12-02 LAB — WET PREP, GENITAL
Sperm: NONE SEEN
Trich, Wet Prep: NONE SEEN
WBC, Wet Prep HPF POC: NONE SEEN
Yeast Wet Prep HPF POC: NONE SEEN

## 2019-12-02 LAB — RAPID HIV SCREEN (HIV 1/2 AB+AG)
HIV 1/2 Antibodies: NONREACTIVE
HIV-1 P24 Antigen - HIV24: NONREACTIVE

## 2019-12-02 LAB — I-STAT BETA HCG BLOOD, ED (MC, WL, AP ONLY): I-stat hCG, quantitative: <5 m[IU]/mL (ref <5)

## 2019-12-02 NOTE — Discharge Instructions (Signed)
Follow up with [facility]

## 2019-12-02 NOTE — ED Triage Notes (Signed)
Patient reports she needs her blood drawn for an STD check.

## 2019-12-02 NOTE — ED Provider Notes (Signed)
[facility]-EMERGENC[facility] DEPT Provider Note   CSN: [id number] Arrival date & time: 12/02/19  1610    History Chief Complaint  Patient presents with   STD check    Heidi Estes is a 24 y.o. female with past medical history significant for syphilis who presents for evaluation of STD screen.  States she recently start her menstrual cycle.  Prior to that she had some dark brown vaginal discharge.  No pruritus.  No associated nominal pain, pelvic pain, dysuria.  Sexually active, intermittently uses protection.  Denies chance of pregnancy.  No fever, chills, nausea, abdominal pain, diarrhea, constipation, rashes or lesions.  Denies additional aggravating or relieving factors. Patient primarily concerned as she has not been test for syphilis in "a long while." Denies rashes or lesions.  History obtained from patient and past medical records. No interpretor was used.  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, antepartum     Past Surgical History:  Procedure Laterality Date   WISDOM TOOTH EXTRACTION Bilateral 07/2013   x4     OB History    Gravida  2   Para  2   Term  2   Preterm  0   AB      Living  2     SAB      TAB      Ectopic      Multiple  0   Live Births  2           Family History  Problem Relation Age of Onset   Asthma Mother    Hypertension Mother    Hypertension Maternal Aunt     Social History   Tobacco Use   Smoking status: Never Smoker   Smokeless tobacco: Never Used  Vaping Use   Vaping Use: Never used  Substance Use Topics   Alcohol use: Yes    Comment: Occassional    Drug use: No    Home Medications Prior to Admission medications   Medication Sig Start Date End Date Taking?  Authorizing Provider  metroNIDAZOLE (FLAGYL) 500 MG tablet Take 1 tablet (500 mg total) by mouth 2 (two) times daily. One po bid x 7 days 10/08/19   Cardama, Amadeo Garnet, MD    Allergies    Macrobid [nitrofurantoin monohyd macro]  Review of Systems   Review of Systems  Constitutional: Negative.   HENT: Negative.   Respiratory: Negative.   Cardiovascular: Negative.   Gastrointestinal: Negative.   Genitourinary: Positive for vaginal bleeding (On menstrual cycle) and vaginal discharge. Negative for decreased urine volume, difficulty urinating, dysuria, enuresis, flank pain, frequency, genital sores, hematuria, menstrual problem, pelvic pain, urgency and vaginal pain.  Musculoskeletal: Negative.   Skin: Negative.   Neurological: Negative.   All other systems reviewed and are negative.   Physical Exam Updated Vital Signs BP (!) 124/93 (BP Location: Left Arm)    Pulse 73    Temp 98.5 F (36.9 C) (Oral)    Resp 16    SpO2 100%   Physical Exam Vitals and nursing note reviewed.  Constitutional:      General: She is not in acute distress.    Appearance: She is well-developed. She is not ill-appearing, toxic-appearing or  diaphoretic.  HENT:     Head: Normocephalic and atraumatic.     Nose: Nose normal.     Mouth/Throat:     Mouth: Mucous membranes are moist.  Eyes:     Pupils: Pupils are equal, round, and reactive to light.  Cardiovascular:     Rate and Rhythm: Normal rate.     Pulses: Normal pulses.     Heart sounds: Normal heart sounds.  Pulmonary:     Effort: Pulmonary effort is normal. No respiratory distress.     Breath sounds: Normal breath sounds.  Abdominal:     General: Bowel sounds are normal. There is no distension.     Palpations: There is no mass.     Tenderness: There is no abdominal tenderness. There is no right CVA tenderness, left CVA tenderness, guarding or rebound.     Hernia: No hernia is present.  Genitourinary:    Comments: Declined GU exam.    Musculoskeletal:        General: Normal range of motion.     Cervical back: Normal range of motion.     Comments: Moves all 4 extremities without difficulty  Skin:    General: Skin is warm and dry.     Capillary Refill: Capillary refill takes less than 2 seconds.     Comments: No visible rashes or lesions however unable to see GU exam due to declining exam.  Neurological:     Mental Status: She is alert.     Gait: Gait is intact.     Comments: Ambulatory without difficulty    ED Results / Procedures / Treatments   Labs (all labs ordered are listed, but only abnormal results are displayed) Labs Reviewed  WET PREP, GENITAL  RPR  RAPID HIV SCREEN (HIV 1/2 AB+AG)  I-STAT BETA HCG BLOOD, ED ([facility], [facility], [facility] ONLY)  GC/CHLAMYDIA PROBE AMP ([place]) NOT AT [facility]   EKG None  Radiology No results found.  Procedures Procedures (including critical care time)  Medications Ordered in ED Medications - No data to display  ED Course  I have reviewed the triage vital signs and the nursing notes.  Pertinent labs & imaging results that were available during my care of the patient were reviewed by me and considered in my medical decision making (see chart for details).  Patient presents for evaluation of requesting STD screen. Hx of recurrent syphilis, last 2019.  Patient is sexually active.  Also for recurrent BV.  Has had brown discharge for the last 2 days or recently start her menstrual cycle.  She denies any abdominal pain, nausea, vomiting, rashes, lesions.  Patient declines GU exam as she recently start her cycle is not comfortable with this.  She elects to self swab.  We will also obtain blood for HIV and RPR.  Patient does not want antibiotics for GC, chlamydia.  We will also get wet prep.  She understands unable to rule out PID without GU exam however she denies any pain.  No urinary complaints.  Patient does NOT want to wait for Wet Prep. States she has MyChart and follow up with her  ObGyn for her results.  She does have OB/GYN.  She understands she will be called if any of these tests are positive.  She will need follow-up at that point.  The patient has been appropriately medically screened and/or stabilized in the ED. I have low suspicion for any other emergent medical condition which would require further screening, evaluation or treatment in  the ED or require inpatient management.  Patient is hemodynamically stable and in no acute distress.  Patient able to ambulate in department prior to ED.  Evaluation does not show acute pathology that would require ongoing or additional emergent interventions while in the emergency department or further inpatient treatment.  I have discussed the diagnosis with the patient and answered all questions.  Pain is been managed while in the emergency department and patient has no further complaints prior to discharge.  Patient is comfortable with plan discussed in room and is stable for discharge at this time.  I have discussed strict return precautions for returning to the emergency department.  Patient was encouraged to follow-up with PCP/specialist refer to at discharge.    MDM Rules/Calculators/A&P                           Final Clinical Impression(s) / ED Diagnoses Final diagnoses:  Concern about STD in female without diagnosis    Rx / DC Orders ED Discharge Orders    None       Anye Brose A, PA-C 12/02/19 1937    Pricilla Loveless, MD 12/03/19 [phone]

## 2019-12-03 LAB — RPR: RPR Ser Ql: NONREACTIVE

## 2019-12-06 LAB — GC/CHLAMYDIA PROBE AMP (~~LOC~~) NOT AT ARMC
Chlamydia: NEGATIVE
Comment: NEGATIVE
Comment: NORMAL
Neisseria Gonorrhea: NEGATIVE

## 2020-02-14 ENCOUNTER — Inpatient Hospital Stay (HOSPITAL_COMMUNITY)
Admission: AD | Admit: 2020-02-14 | Discharge: 2020-02-14 | Disposition: A | Discharge status: Home / Self Care | Payer: Medicaid Other | Attending: Obstetrics and Gynecology | Admitting: Obstetrics and Gynecology

## 2020-02-14 ENCOUNTER — Inpatient Hospital Stay (HOSPITAL_COMMUNITY): Payer: Medicaid Other

## 2020-02-14 ENCOUNTER — Other Ambulatory Visit: Payer: Self-pay

## 2020-02-14 ENCOUNTER — Encounter (HOSPITAL_COMMUNITY): Payer: Self-pay | Admitting: Obstetrics and Gynecology

## 2020-02-14 ENCOUNTER — Ambulatory Visit (INDEPENDENT_AMBULATORY_CARE_PROVIDER_SITE_OTHER): Payer: BC Managed Care – PPO | Admitting: Primary Care

## 2020-02-14 DIAGNOSIS — Z881 Allergy status to other antibiotic agents status: Secondary | ICD-10-CM | POA: Insufficient documentation

## 2020-02-14 DIAGNOSIS — O4690 Antepartum hemorrhage, unspecified, unspecified trimester: Secondary | ICD-10-CM

## 2020-02-14 DIAGNOSIS — R9389 Abnormal findings on diagnostic imaging of other specified body structures: Secondary | ICD-10-CM | POA: Diagnosis not present

## 2020-02-14 DIAGNOSIS — Z8249 Family history of ischemic heart disease and other diseases of the circulatory system: Secondary | ICD-10-CM | POA: Diagnosis not present

## 2020-02-14 DIAGNOSIS — O209 Hemorrhage in early pregnancy, unspecified: Secondary | ICD-10-CM | POA: Insufficient documentation

## 2020-02-14 DIAGNOSIS — O200 Threatened abortion: Secondary | ICD-10-CM

## 2020-02-14 DIAGNOSIS — O4691 Antepartum hemorrhage, unspecified, first trimester: Secondary | ICD-10-CM | POA: Diagnosis not present

## 2020-02-14 DIAGNOSIS — Z3A01 Less than 8 weeks gestation of pregnancy: Secondary | ICD-10-CM | POA: Insufficient documentation

## 2020-02-14 LAB — URINALYSIS, MICROSCOPIC (REFLEX): RBC / HPF: >50 RBC/hpf (ref 0–5)

## 2020-02-14 LAB — URINALYSIS, ROUTINE W REFLEX MICROSCOPIC

## 2020-02-14 LAB — CBC
HCT: 38 % (ref 36.0–46.0)
Hemoglobin: 12.6 g/dL (ref 12.0–15.0)
MCH: 30 pg (ref 26.0–34.0)
MCHC: 33.2 g/dL (ref 30.0–36.0)
MCV: 90.5 fL (ref 80.0–100.0)
Platelets: 232 10*3/uL (ref 150–400)
RBC: 4.2 MIL/uL (ref 3.87–5.11)
RDW: 13.6 % (ref 11.5–15.5)
WBC: 6.8 10*3/uL (ref 4.0–10.5)
nRBC: 0 % (ref 0.0–0.2)

## 2020-02-14 LAB — HCG, QUANTITATIVE, PREGNANCY: hCG, Beta Chain, Quant, S: 24584 m[IU]/mL — ABNORMAL HIGH (ref <5)

## 2020-02-14 LAB — POCT PREGNANCY, URINE: Preg Test, Ur: POSITIVE — AB

## 2020-02-14 MED ORDER — ACETAMINOPHEN 500 MG PO TABS
1000.0000 mg | ORAL_TABLET | Freq: Once | ORAL | Status: AC
  Administered 2020-02-14: 1000 mg via ORAL
  Filled 2020-02-14: qty 2

## 2020-02-14 NOTE — MAU Note (Signed)
Presents with c/o VB that began yesterday, but began to worsen today.  Reports changed 2-3 pads hourly and passing "big" clots.  LPM 12/30/19 and +HPT.

## 2020-02-14 NOTE — MAU Provider Note (Signed)
History     CSN: [id number]  Arrival date and time: 02/14/20 1437   First Provider Initiated Contact with Patient 02/14/20 1608      Chief Complaint  Patient presents with  . Vaginal Bleeding   Heidi Estes is a 24 y.o. G3P2002 at [redacted]w[redacted]d by Definite LMP of December 30, 2019.  She presents today for Vaginal Bleeding.  She states that she thinks she is having a miscarriage as she started bleeding yesterday and it has worsened today. She states she has used ~2-3 "big" pads today. She reports the pads were "full of the bleeding."  Patient reports she is passing clots and had "a nice size one yesterday and two today."  She states the clots today were about the size of an orange or pear. Patient endorses pain and describes it as cramping. She rates her pain a 6-7/10.    OB History    Gravida  3   Para  2   Term  2   Preterm  0   AB      Living  2     SAB      TAB      Ectopic      Multiple  0   Live Births  2           Past Medical History:  Diagnosis Date  . Multiple allergies   . Postpartum depression   . STD (female)     Past Surgical History:  Procedure Laterality Date  . COSMETIC SURGERY N/A    tummy tuck  . WISDOM TOOTH EXTRACTION Bilateral 07/2013   x4    Family History  Problem Relation Age of Onset  . Asthma Mother   . Hypertension Mother   . Hypertension Maternal Aunt     Social History   Tobacco Use  . Smoking status: Never Smoker  . Smokeless tobacco: Never Used  Vaping Use  . Vaping Use: Never used  Substance Use Topics  . Alcohol use: Not Currently    Comment: Occassional   . Drug use: No    Allergies:  Allergies  Allergen Reactions  . Macrobid [Nitrofurantoin Monohyd Macro] Nausea And Vomiting    Medications Prior to Admission  Medication Sig Dispense Refill Last Dose  . metroNIDAZOLE (FLAGYL) 500 MG tablet Take 1 tablet (500 mg total) by mouth 2 (two) times daily. One po bid x 7 days 14 tablet 0     Review of Systems   Constitutional: Negative for chills and fever.  Respiratory: Positive for cough. Negative for shortness of breath.   Gastrointestinal: Positive for abdominal pain. Negative for nausea and vomiting.  Genitourinary: Positive for vaginal bleeding and vaginal discharge. Negative for difficulty urinating and dysuria.  Skin: Positive for rash (Arms).  Neurological: Positive for light-headedness. Negative for dizziness and headaches.   Physical Exam   Blood pressure 116/77, pulse 88, temperature 98.5 F (36.9 C), temperature source Oral, resp. rate 18, height 5' 1.5" (1.562 m), weight 89.4 kg, last menstrual period 12/30/2019, SpO2 100 %, unknown if currently breastfeeding.  Physical Exam Vitals and nursing note reviewed. Exam conducted with a chaperone present.  Constitutional:      Appearance: Normal appearance.  HENT:     Head: Normocephalic and atraumatic.  Eyes:     Conjunctiva/sclera: Conjunctivae normal.  Cardiovascular:     Rate and Rhythm: Normal rate and regular rhythm.     Heart sounds: Normal heart sounds.  Pulmonary:     Effort: Pulmonary  effort is normal.     Breath sounds: Normal breath sounds.  Abdominal:     General: Bowel sounds are normal.  Genitourinary:    Vagina: Bleeding present.     Cervix: Cervical bleeding present. No friability.     Comments: Speculum Exam: -Normal External Genitalia: Non tender, no apparent discharge at introitus.  -Vaginal Vault: Pink mucosa with good rugae. Large amt dark red blood in vault.  Removed with faux swab x 6.  Small dime sized blood clot removed from cervical os  -Cervix:Pink, no lesions, cysts, or polyps.  Appears closed. Small amt active bleeding from os -Bimanual Exam:  Deferred  Musculoskeletal:        General: Normal range of motion.     Cervical back: Normal range of motion.  Skin:    General: Skin is warm and dry.  Neurological:     Mental Status: She is alert and oriented to person, place, and time.  Psychiatric:         Mood and Affect: Mood normal.        Thought Content: Thought content normal.     MAU Course  Procedures Results for orders placed or performed during the hospital encounter of 02/14/20 (from the past 24 hour(s))  Pregnancy, urine POC     Status: Abnormal   Collection Time: 02/14/20  3:24 PM  Result Value Ref Range   Preg Test, Ur POSITIVE (A) NEGATIVE  Urinalysis, Routine w reflex microscopic Urine, Clean Catch     Status: Abnormal   Collection Time: 02/14/20  3:25 PM  Result Value Ref Range   Color, Urine RED (A) YELLOW   APPearance TURBID (A) CLEAR   Specific Gravity, Urine  1.005 - 1.030    TEST NOT REPORTED DUE TO COLOR INTERFERENCE OF URINE PIGMENT   pH  5.0 - 8.0    TEST NOT REPORTED DUE TO COLOR INTERFERENCE OF URINE PIGMENT   Glucose, UA (A) NEGATIVE mg/dL    TEST NOT REPORTED DUE TO COLOR INTERFERENCE OF URINE PIGMENT   Hgb urine dipstick (A) NEGATIVE    TEST NOT REPORTED DUE TO COLOR INTERFERENCE OF URINE PIGMENT   Bilirubin Urine (A) NEGATIVE    TEST NOT REPORTED DUE TO COLOR INTERFERENCE OF URINE PIGMENT   Ketones, ur (A) NEGATIVE mg/dL    TEST NOT REPORTED DUE TO COLOR INTERFERENCE OF URINE PIGMENT   Protein, ur (A) NEGATIVE mg/dL    TEST NOT REPORTED DUE TO COLOR INTERFERENCE OF URINE PIGMENT   Nitrite (A) NEGATIVE    TEST NOT REPORTED DUE TO COLOR INTERFERENCE OF URINE PIGMENT   Leukocytes,Ua (A) NEGATIVE    TEST NOT REPORTED DUE TO COLOR INTERFERENCE OF URINE PIGMENT  Urinalysis, Microscopic (reflex)     Status: Abnormal   Collection Time: 02/14/20  3:25 PM  Result Value Ref Range   RBC / HPF >50 0 - 5 RBC/hpf   WBC, UA 6-10 0 - 5 WBC/hpf   Bacteria, UA FEW (A) NONE SEEN   Squamous Epithelial / LPF 0-5 0 - 5   Mucus PRESENT   CBC     Status: None   Collection Time: 02/14/20  4:23 PM  Result Value Ref Range   WBC 6.8 4.0 - 10.5 K/uL   RBC 4.20 3.87 - 5.11 MIL/uL   Hemoglobin 12.6 12.0 - 15.0 g/dL   HCT 00.4 36 - 46 %   MCV 90.5 80.0 - 100.0 fL    MCH 30.0 26.0 - 34.0 pg  MCHC 33.2 30.0 - 36.0 g/dL   RDW 99.3 57.0 - 17.7 %   Platelets 232 150 - 400 K/uL   nRBC 0.0 0.0 - 0.2 %  hCG, quantitative, pregnancy     Status: Abnormal   Collection Time: 02/14/20  4:23 PM  Result Value Ref Range   hCG, Beta Chain, Quant, S 24,584 (H) <5 mIU/mL   US OB LESS THAN 14 WEEKS WITH OB TRANSVAGINAL  Result Date: 02/14/2020 CLINICAL DATA:  Pregnant patient in early pregnancy with vaginal bleeding. Beta hCG not available. Last menstrual period 12/30/2019 EXAM: OBSTETRIC <14 WK [place] AND TRANSVAGINAL OB US TECHNIQUE: Both transabdominal and transvaginal ultrasound examinations were performed for complete evaluation of the gestation as well as the maternal uterus, adnexal regions, and pelvic cul-de-sac. Transvaginal technique was performed to assess early pregnancy. COMPARISON:  None this pregnancy. FINDINGS: Intrauterine gestational sac: None Yolk sac:  Not Visualized. Embryo:  Not Visualized. Maternal uterus/adnexae: The uterus is anteverted. The endometrium is heterogeneously thickened measuring up to 1.8 cm. There is no endometrial blood flow. No intrauterine gestational sac. Both ovaries are visualized and are normal. The right ovary measures 4.8 x 2.8 x 2.8 cm with blood flow. The left ovary measures 4.4 x 2.8 x 2.5 cm with blood flow. There is no adnexal mass or pelvic free fluid. IMPRESSION: 1. No intrauterine pregnancy or findings suspicious for ectopic pregnancy. Findings are consistent with pregnancy of unknown location and may reflect early intrauterine pregnancy not yet visualized sonographically, occult ectopic pregnancy, or failed pregnancy. Recommend trending of beta HCG and follow-up ultrasound as indicated. 2. Nonspecific heterogeneously thickened endometrium. Electronically Signed   By: Narda Rutherford M.D.   On: 02/14/2020 17:15    MDM Pelvic Exam Labs: UA, UPT, CBC, hCG Ultrasound Analgesic Assessment and Plan  24 year old G3P2002 at  6.4 weeks Vaginal Bleeding  -Reviewed POC with patient. -Exam performed and findings discussed.  -Informed that considering +UPT, findings are suspicious for SAB -However, will send for [place] to identify uterine contents. -Labs ordered and pending -Offered and accepts pain medication.  Tylenol ordered.    Cherre Robins 02/14/2020, 4:08 PM   Reassessment (5:52 PM) Likely SAB  -Results as above. -Urine sent for culture. -Provider to bedside and patient states her pain has improved despite not receiving her tylenol dose. -Patient informed of results.  -Questions addressed. -Discussed need for follow up in 2 days for repeat quant. -Scheduled for Wednesday Sept 29th at [facility]. -Patient without further questions or concerns. -Encouraged to call or return to MAU if symptoms worsen or with the onset of new symptoms. -Discharged to home in stable condition.  Cherre Robins MSN, CNM Advanced Practice Provider, [facility] [employer]

## 2020-02-14 NOTE — Discharge Instructions (Signed)

## 2020-02-15 LAB — URINE CULTURE: Culture: <10000 — AB

## 2020-02-16 ENCOUNTER — Other Ambulatory Visit: Payer: Self-pay

## 2020-02-16 ENCOUNTER — Ambulatory Visit: Payer: BC Managed Care – PPO

## 2020-02-17 ENCOUNTER — Ambulatory Visit: Payer: BC Managed Care – PPO

## 2020-02-17 VITALS — BP 123/87 | HR 77 | Temp 97.9°F | Wt 201.0 lb

## 2020-02-17 DIAGNOSIS — O200 Threatened abortion: Secondary | ICD-10-CM

## 2020-02-17 LAB — BETA HCG QUANT (REF LAB): hCG Quant: 2080 m[IU]/mL

## 2020-02-17 NOTE — Progress Notes (Addendum)
SUBJECTIVE 24 y.o. presents for STAT HCG. Blood drawn and sent to Lab.  Patient c/o bleeding, pain 3/10.  Denies fever, NV, chills.  PLAN Patient will be called with results.   .....................................................................Marland Kitchen  1:03 pm TC to patient with results. Provider wants her to return in 2 weeks for Quant HCG and counseling with a Provider. Patient verbalized understanding and agreement.    Reviewed the nurse's note and agree with the plan of care.  Nolene Bernheim, RN, MSN, NP-BC Nurse Practitioner, [facility] for [employer], [facility] 02/17/2020 4:15 PM

## 2020-03-02 ENCOUNTER — Ambulatory Visit: Payer: BC Managed Care – PPO

## 2020-03-28 ENCOUNTER — Other Ambulatory Visit: Payer: Self-pay

## 2020-03-28 ENCOUNTER — Emergency Department (HOSPITAL_COMMUNITY)
Admission: EM | Admit: 2020-03-28 | Discharge: 2020-03-29 | Disposition: A | Discharge status: Home / Self Care | Payer: BC Managed Care – PPO | Attending: Emergency Medicine | Admitting: Emergency Medicine

## 2020-03-28 ENCOUNTER — Encounter (HOSPITAL_COMMUNITY): Payer: Self-pay

## 2020-03-28 DIAGNOSIS — K5900 Constipation, unspecified: Secondary | ICD-10-CM | POA: Diagnosis present

## 2020-03-28 NOTE — ED Triage Notes (Signed)
Pt complains of being constipated for two weeks, she states she saw a small amount of blood in the toilet today Pt states she was told she had a hernia two weeks ago

## 2020-03-29 ENCOUNTER — Emergency Department (HOSPITAL_COMMUNITY): Payer: BC Managed Care – PPO

## 2020-03-29 ENCOUNTER — Encounter (HOSPITAL_COMMUNITY): Payer: Self-pay | Admitting: Emergency Medicine

## 2020-03-29 DIAGNOSIS — K5900 Constipation, unspecified: Secondary | ICD-10-CM | POA: Diagnosis not present

## 2020-03-29 LAB — URINALYSIS, ROUTINE W REFLEX MICROSCOPIC
Bilirubin Urine: NEGATIVE
Glucose, UA: NEGATIVE mg/dL
Hgb urine dipstick: NEGATIVE
Ketones, ur: 5 mg/dL — AB
Leukocytes,Ua: NEGATIVE
Nitrite: NEGATIVE
Protein, ur: 30 mg/dL — AB
Specific Gravity, Urine: 1.031 — ABNORMAL HIGH (ref 1.005–1.030)
pH: 6 (ref 5.0–8.0)

## 2020-03-29 LAB — PREGNANCY, URINE: Preg Test, Ur: NEGATIVE

## 2020-03-29 MED ORDER — DOCUSATE SODIUM 100 MG PO CAPS
100.0000 mg | ORAL_CAPSULE | Freq: Once | ORAL | Status: AC
  Administered 2020-03-29: 100 mg via ORAL
  Filled 2020-03-29: qty 1

## 2020-03-29 MED ORDER — SENNA 8.6 MG PO TABS
1.0000 | ORAL_TABLET | ORAL | Status: AC
  Administered 2020-03-29: 8.6 mg via ORAL
  Filled 2020-03-29: qty 1

## 2020-03-29 NOTE — Discharge Instructions (Addendum)
Miralax 1 capful three times daily for one week then 2 times daily and take dulcolax 1 tablet daily for 2 days, may also use glycerin suppository

## 2020-03-29 NOTE — ED Notes (Signed)
Pt c/o constipation for several month and diffuse abd pain. Pt states she was straining to night having a BM and notice blood in her stool

## 2020-03-29 NOTE — ED Provider Notes (Addendum)
[facility]-EMERGENCY DEPT Provider Note   CSN: [id number] Arrival date & time: 03/28/20  2339     History No chief complaint on file.   Heidi Estes is a 24 y.o. female.  The history is provided by the patient.  Constipation Severity:  Moderate Time since last bowel movement:  2 weeks Timing:  Constant Progression:  Unchanged Chronicity:  Chronic Context: not dehydration and not dietary changes   Stool description:  None produced Relieved by:  Nothing Worsened by:  Nothing Ineffective treatments:  Miralax (took miralax last week ) Associated symptoms: no abdominal pain and no fever   Risk factors: no change in medication   Patient was straining and had scant blood on paper and in toilet today.       Past Medical History:  Diagnosis Date  . Multiple allergies   . Postpartum depression   . STD (female)     Patient Active Problem List   Diagnosis Date Noted  . Post term pregnancy, 41 weeks 03/04/2015  . 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 COSMETIC SURGERY N/A    tummy tuck  . WISDOM TOOTH EXTRACTION Bilateral 07/2013   x4     OB History    Gravida  3   Para  2   Term  2   Preterm  0   AB      Living  2     SAB      TAB      Ectopic      Multiple  0   Live Births  2           Family History  Problem Relation Age of Onset  . Asthma Mother   . Hypertension Mother   . Hypertension Maternal Aunt     Social History   Tobacco Use  . Smoking status: Never Smoker  . Smokeless tobacco: Never Used  Vaping Use  . Vaping Use: Never used  Substance Use Topics  . Alcohol use: Not Currently    Comment: Occassional   . Drug use: No    Home Medications Prior to Admission medications   Not on File    Allergies    Macrobid [nitrofurantoin monohyd macro]  Review of Systems   Review of Systems  Constitutional: Negative for  fever.  HENT: Negative for congestion.   Eyes: Negative for visual disturbance.  Respiratory: Negative for shortness of breath.   Cardiovascular: Negative for chest pain.  Gastrointestinal: Positive for constipation. Negative for abdominal pain.  Genitourinary: Negative for dyspareunia.  Musculoskeletal: Negative for arthralgias.  Skin: Negative for rash.  Neurological: Negative for dizziness.  Psychiatric/Behavioral: Negative for agitation.  All other systems reviewed and are negative.   Physical Exam Updated Vital Signs BP (!) 125/95 (BP Location: Left Arm)   Pulse 86   Temp 98.4 F (36.9 C) (Oral)   Resp 18   LMP 03/18/2020   SpO2 100%   Breastfeeding Unknown   Physical Exam Vitals and nursing note reviewed. Exam conducted with a chaperone present.  Constitutional:      General: She is not in acute distress.    Appearance: Normal appearance.  HENT:     Head: Normocephalic and atraumatic.     Nose: Nose normal.  Eyes:     Conjunctiva/sclera: Conjunctivae normal.     Pupils: Pupils are equal, round, and reactive to light.  Cardiovascular:  Rate and Rhythm: Normal rate and regular rhythm.     Pulses: Normal pulses.     Heart sounds: Normal heart sounds.  Pulmonary:     Effort: Pulmonary effort is normal.     Breath sounds: Normal breath sounds.  Abdominal:     General: Abdomen is flat. Bowel sounds are normal.     Palpations: Abdomen is soft.     Tenderness: There is no abdominal tenderness. There is no guarding or rebound.  Genitourinary:    Comments: Anal fissure six oclock position  Musculoskeletal:        General: Normal range of motion.     Cervical back: Normal range of motion and neck supple.  Skin:    General: Skin is warm and dry.     Capillary Refill: Capillary refill takes less than 2 seconds.  Neurological:     General: No focal deficit present.     Mental Status: She is alert and oriented to person, place, and time.     Deep Tendon Reflexes:  Reflexes normal.  Psychiatric:        Mood and Affect: Mood normal.        Behavior: Behavior normal.     ED Results / Procedures / Treatments   Labs (all labs ordered are listed, but only abnormal results are displayed) Results for orders placed or performed during the hospital encounter of 03/28/20  Urinalysis, Routine w reflex microscopic Urine, Clean Catch  Result Value Ref Range   Color, Urine YELLOW YELLOW   APPearance CLEAR CLEAR   Specific Gravity, Urine 1.031 (H) 1.005 - 1.030   pH 6.0 5.0 - 8.0   Glucose, UA NEGATIVE NEGATIVE mg/dL   Hgb urine dipstick NEGATIVE NEGATIVE   Bilirubin Urine NEGATIVE NEGATIVE   Ketones, ur 5 (A) NEGATIVE mg/dL   Protein, ur 30 (A) NEGATIVE mg/dL   Nitrite NEGATIVE NEGATIVE   Leukocytes,Ua NEGATIVE NEGATIVE   RBC / HPF 0-5 0 - 5 RBC/hpf   WBC, UA 0-5 0 - 5 WBC/hpf   Bacteria, UA FEW (A) NONE SEEN   Squamous Epithelial / LPF 0-5 0 - 5   Mucus PRESENT   Pregnancy, urine  Result Value Ref Range   Preg Test, Ur NEGATIVE NEGATIVE   DG ABD ACUTE 2+V W 1V CHEST  Result Date: 03/29/2020 CLINICAL DATA:  Constipation for 2 weeks, small amount of blood in toilet EXAM: DG ABDOMEN ACUTE WITH 1 VIEW CHEST COMPARISON:  None. FINDINGS: PA view of the chest with AP upright and supine views of the abdomen No consolidation, features of edema, pneumothorax, or effusion. The cardiomediastinal contours are unremarkable. Telemetry leads overlie the chest. No subdiaphragmatic free air. No high-grade obstructive bowel gas pattern. Moderate stool burden throughout the colon. No large inspissated rectal stool ball or evidence of impaction at this time. No suspicious abdominal calcifications. Osseous structures are free of acute abnormality. IMPRESSION: 1. Moderate stool burden. No high-grade obstructive bowel gas pattern. 2. No acute cardiopulmonary disease. Electronically Signed   By: Kreg Shropshire M.D.   On: 03/29/2020 01:49    EKG None  Radiology DG ABD ACUTE  2+V W 1V CHEST  Result Date: 03/29/2020 CLINICAL DATA:  Constipation for 2 weeks, small amount of blood in toilet EXAM: DG ABDOMEN ACUTE WITH 1 VIEW CHEST COMPARISON:  None. FINDINGS: PA view of the chest with AP upright and supine views of the abdomen No consolidation, features of edema, pneumothorax, or effusion. The cardiomediastinal contours are unremarkable. Telemetry leads overlie  the chest. No subdiaphragmatic free air. No high-grade obstructive bowel gas pattern. Moderate stool burden throughout the colon. No large inspissated rectal stool ball or evidence of impaction at this time. No suspicious abdominal calcifications. Osseous structures are free of acute abnormality. IMPRESSION: 1. Moderate stool burden. No high-grade obstructive bowel gas pattern. 2. No acute cardiopulmonary disease. Electronically Signed   By: Kreg Shropshire M.D.   On: 03/29/2020 01:49    Procedures Procedures (including critical care time)  Medications Ordered in ED Medications  docusate sodium (COLACE) capsule 100 mg (has no administration in time range)  senna (SENOKOT) tablet 8.6 mg (has no administration in time range)    ED Course  I have reviewed the triage vital signs and the nursing notes.  Pertinent labs & imaging results that were available during my care of the patient were reviewed by me and considered in my medical decision making (see chart for details).    Will start bowel regimen and have patient follow up.    Heidi Estes was evaluated in Emergency Department on 03/29/2020 for the symptoms described in the history of present illness. She was evaluated in the context of the global COVID-19 pandemic, which necessitated consideration that the patient might be at risk for infection with the SARS-CoV-2 virus that causes COVID-19. Institutional protocols and algorithms that pertain to the evaluation of patients at risk for COVID-19 are in a state of rapid change based on information released by  regulatory bodies including the CDC and federal and state organizations. These policies and algorithms were followed during the patient's care in the ED.  Final Clinical Impression(s) / ED Diagnoses Return for intractable cough, coughing up blood,fevers >100.4 unrelieved by medication, shortness of breath, intractable vomiting, chest pain, shortness of breath, weakness,numbness, changes in speech, facial asymmetry,abdominal pain, passing out,Inability to tolerate liquids or food, cough, altered mental status or any concerns. No signs of systemic illness or infection. The patient is nontoxic-appearing on exam and vital signs are within normal limits.   I have reviewed the triage vital signs and the nursing notes. Pertinent labs &imaging results that were available during my care of the patient were reviewed by me and considered in my medical decision making (see chart for details).After history, exam, and medical workup I feel the patient has beenappropriately medically screened and is safe for discharge home. Pertinent diagnoses were discussed with the patient. Patient was given return precautions.    Khai Torbert, MD 03/29/20 Ned Clines, Anaissa Macfadden, MD 03/29/20 0263

## 2020-08-03 ENCOUNTER — Other Ambulatory Visit: Payer: Self-pay

## 2020-08-03 ENCOUNTER — Encounter (HOSPITAL_COMMUNITY): Payer: Self-pay

## 2020-08-03 ENCOUNTER — Emergency Department (HOSPITAL_COMMUNITY)
Admission: EM | Admit: 2020-08-03 | Discharge: 2020-08-03 | Disposition: A | Discharge status: Home / Self Care | Payer: Medicaid Other | Attending: Emergency Medicine | Admitting: Emergency Medicine

## 2020-08-03 DIAGNOSIS — N760 Acute vaginitis: Secondary | ICD-10-CM | POA: Insufficient documentation

## 2020-08-03 DIAGNOSIS — B9689 Other specified bacterial agents as the cause of diseases classified elsewhere: Secondary | ICD-10-CM

## 2020-08-03 DIAGNOSIS — N898 Other specified noninflammatory disorders of vagina: Secondary | ICD-10-CM | POA: Diagnosis present

## 2020-08-03 LAB — URINALYSIS, ROUTINE W REFLEX MICROSCOPIC
Bilirubin Urine: NEGATIVE
Glucose, UA: NEGATIVE mg/dL
Hgb urine dipstick: NEGATIVE
Ketones, ur: 5 mg/dL — AB
Nitrite: NEGATIVE
Protein, ur: NEGATIVE mg/dL
Specific Gravity, Urine: 1.031 — ABNORMAL HIGH (ref 1.005–1.030)
pH: 6 (ref 5.0–8.0)

## 2020-08-03 LAB — WET PREP, GENITAL
Sperm: NONE SEEN
Trich, Wet Prep: NONE SEEN
Yeast Wet Prep HPF POC: NONE SEEN

## 2020-08-03 LAB — PREGNANCY, URINE: Preg Test, Ur: NEGATIVE

## 2020-08-03 MED ORDER — METRONIDAZOLE 500 MG PO TABS: 500.0000 mg | ORAL_TABLET | Freq: Two times a day (BID) | ORAL | 0 refills | Status: DC

## 2020-08-03 MED ORDER — FLUCONAZOLE 150 MG PO TABS: ORAL_TABLET | ORAL | 0 refills | Status: DC

## 2020-08-03 NOTE — Discharge Instructions (Addendum)
You were seen in the emergency department today for vaginal irritation.  Your wet prep sample shows findings consistent with bacterial vaginosis, please see attached handout.  We are treating this with Flagyl, take 1 tablet twice per day for the next 1 week.  Do not drink alcohol with this medication as it can be extremely dangerous.  Your exam was concerning for a yeast infection as well therefore we are starting you on Diflucan, take 1 tablet, if you are having persistent vaginal symptoms after 72 hours take the second tablet.    We have prescribed you new medication(s) today. Discuss the medications prescribed today with your pharmacist as they can have adverse effects and interactions with your other medicines including over the counter and prescribed medications. Seek medical evaluation if you start to experience new or abnormal symptoms after taking one of these medicines, seek care immediately if you start to experience difficulty breathing, feeling of your throat closing, facial swelling, or rash as these could be indications of a more serious allergic reaction  Please follow-up with OB/GYN within 1 week.  Return to the ER for new or worsening symptoms including but not limited to pelvic pain, abdominal pain, fever, vomiting, or any other concerns.

## 2020-08-03 NOTE — ED Triage Notes (Signed)
Pt reports hematuria, urinary frequency, vaginal discharge and irritation x1 week. Pt reports having unprotected sex recently.

## 2020-08-03 NOTE — ED Provider Notes (Cosign Needed Addendum)
[facility]-EMERGENCY DEPT Provider Note   CSN: [id number] Arrival date & time: 08/03/20  1729     History Chief Complaint  Patient presents with  . Vaginal Discharge    Heidi Estes is a 25 y.o. female with a hx of prior STD who presents to the ED with complaints of vaginal irritation over the past 1 week.  Patient states that she has had white thick vaginal discharge, pruritus, and skin irritation at the introitus of the vagina.  She is also had some discomfort when she urinates and if she scratches at the area too much she does have a little bit of bleeding.  No other alleviating or aggravating factors.  She denies fever, nausea, vomiting, pelvic pain, abdominal pain, frequency, urgency, or vaginal bleeding.  She is sexually active in a monogamous relationship without protection.   HPI     Past Medical History:  Diagnosis Date  . Multiple allergies   . Postpartum depression   . STD (female)     Patient Active Problem List   Diagnosis Date Noted  . Post term pregnancy, 41 weeks 03/04/2015  . 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 COSMETIC SURGERY N/A    tummy tuck  . WISDOM TOOTH EXTRACTION Bilateral 07/2013   x4     OB History    Gravida  3   Para  2   Term  2   Preterm  0   AB      Living  2     SAB      IAB      Ectopic      Multiple  0   Live Births  2           Family History  Problem Relation Age of Onset  . Asthma Mother   . Hypertension Mother   . Hypertension Maternal Aunt     Social History   Tobacco Use  . Smoking status: Never Smoker  . Smokeless tobacco: Never Used  Vaping Use  . Vaping Use: Never used  Substance Use Topics  . Alcohol use: Not Currently    Comment: Occassional   . Drug use: No    Home Medications Prior to Admission medications   Not on File    Allergies    Macrobid [nitrofurantoin monohyd  macro]  Review of Systems   Review of Systems  Constitutional: Negative for chills and fever.  Respiratory: Negative for shortness of breath.   Cardiovascular: Negative for chest pain.  Gastrointestinal: Negative for abdominal pain, blood in stool, diarrhea, nausea and vomiting.  Genitourinary: Positive for difficulty urinating (painful to skin), vaginal discharge and vaginal pain. Negative for pelvic pain and vaginal bleeding.  All other systems reviewed and are negative.   Physical Exam Updated Vital Signs BP (!) 141/102 (BP Location: Left Arm)   Pulse 90   Temp 99.2 F (37.3 C) (Oral)   Resp 16   LMP 12/30/2019   SpO2 100%   Physical Exam Vitals and nursing note reviewed. Exam conducted with a chaperone present.  Constitutional:      General: She is not in acute distress.    Appearance: She is well-developed. She is not toxic-appearing.  HENT:     Head: Normocephalic and atraumatic.  Eyes:     General:        Right eye: No discharge.        Left  eye: No discharge.     Conjunctiva/sclera: Conjunctivae normal.  Cardiovascular:     Rate and Rhythm: Normal rate and regular rhythm.  Pulmonary:     Effort: Pulmonary effort is normal. No respiratory distress.     Breath sounds: Normal breath sounds. No wheezing, rhonchi or rales.  Abdominal:     General: There is no distension.     Palpations: Abdomen is soft.     Tenderness: There is no abdominal tenderness.  Genitourinary:    Comments: No vesicular lesions, pustules, significant erythema, significant skin breakdown, fluctuance, or induration. Patient has thick white vaginal discharge present. Bimanual exam without adnexal or cervical motion tenderness.  Musculoskeletal:     Cervical back: Neck supple.  Skin:    General: Skin is warm and dry.     Findings: No rash.  Neurological:     Mental Status: She is alert.     Comments: Clear speech.   Psychiatric:        Behavior: Behavior normal.     ED Results /  Procedures / Treatments   Labs (all labs ordered are listed, but only abnormal results are displayed) Labs Reviewed  URINALYSIS, ROUTINE W REFLEX MICROSCOPIC - Abnormal; Notable for the following components:      Result Value   Specific Gravity, Urine 1.031 (*)    Ketones, ur 5 (*)    Leukocytes,Ua SMALL (*)    Bacteria, UA RARE (*)    All other components within normal limits  PREGNANCY, URINE    EKG None  Radiology No results found.  Procedures Procedures   Medications Ordered in ED Medications - No data to display  ED Course  I have reviewed the triage vital signs and the nursing notes.  Pertinent labs & imaging results that were available during my care of the patient were reviewed by me and considered in my medical decision making (see chart for details).    MDM Rules/Calculators/A&P                          Patient presents to the ED with complaints of vaginal irritation over the past 1 week.  Additional history obtained:  Additional history obtained from chart review & nursing note review.   Lab Tests:  I Ordered, reviewed, and interpreted labs, which included:  Pregnancy test: Negative Urinalysis: No obvious UTI, small leukocytes present and rare bacteria, sent for culture. Wet prep: Findings consistent with BV. GC/Chlamydia: Pending  ED Course:  Pregnancy test negative.  No abdominal/pelvic pain or exam tenderness to raise concern for PID.  No pain to raise concern for ovarian torsion.  Findings of bacterial vaginosis on wet prep therefore will treat with Flagyl, given appearance on speculum exam I remain with some concern for a yeast therefore will give Diflucan as well.  We will have her follow-up with OB/GYN. I discussed results, treatment plan, need for follow-up, and return precautions with the patient. Provided opportunity for questions, patient confirmed understanding and is in agreement with plan.   Portions of this note were generated with Administrator, sports. Dictation errors may occur despite best attempts at proofreading.  Final Clinical Impression(s) / ED Diagnoses Final diagnoses:  BV (bacterial vaginosis)    Rx / DC Orders ED Discharge Orders         Ordered    metroNIDAZOLE (FLAGYL) 500 MG tablet  2 times daily        08/03/20 2324  fluconazole (DIFLUCAN) 150 MG tablet        08/03/20 2324           Petrucelli, [place] R, PA-C 08/03/20 [address] PA-C 08/03/20 2325    Paula Libra, MD 08/04/20 [phone]

## 2020-08-04 LAB — GC/CHLAMYDIA PROBE AMP (~~LOC~~) NOT AT ARMC
Chlamydia: NEGATIVE
Comment: NEGATIVE
Comment: NORMAL
Neisseria Gonorrhea: NEGATIVE

## 2020-08-05 LAB — URINE CULTURE: Culture: 40000 — AB

## 2020-08-06 ENCOUNTER — Telehealth (HOSPITAL_BASED_OUTPATIENT_CLINIC_OR_DEPARTMENT_OTHER): Payer: Self-pay | Admitting: Emergency Medicine

## 2020-08-06 NOTE — Telephone Encounter (Signed)
Post ED Visit - Positive Culture Follow-up  Culture report reviewed by antimicrobial stewardship pharmacist: [facility] []  , Pharm.D. []  Enzo Bi, Pharm.D., BCPS AQ-ID []  , Pharm.D., BCPS []  Celedonio Miyamoto, .D., BCPS []  [place], .D., BCPS, AAHIVP []  [place]ina Pillion, Pharm.D., BCPS, AAHIVP []  [address], PharmD, BCPS []  , PharmD, BCPS []  [place], PharmD, BCPS []  [place], PharmD []  , PharmD, BCPS []  Estella Husk, PharmD  Pharmacy Team []  Lysle Pearl, PharmD [x]  , PharmD []  Phillips Climes, PharmD []  , Rph []  Agapito Games) , PharmD []  Verlan Friends, PharmD []  , PharmD []  Mervyn Gay, PharmD []  , PharmD []  Vinnie Level, PharmD []  [facility], PharmD []  , PharmD []  Len Childs, PharmD   Positive urine culture No further patient follow-up is required at this time.  Sherine Cortese 08/06/2020, 5:21 PM

## 2020-11-08 ENCOUNTER — Encounter (HOSPITAL_COMMUNITY): Payer: Self-pay | Admitting: Emergency Medicine

## 2020-11-08 ENCOUNTER — Emergency Department (HOSPITAL_COMMUNITY)
Admission: EM | Admit: 2020-11-08 | Discharge: 2020-11-08 | Disposition: A | Discharge status: Home / Self Care | Payer: Medicaid Other | Attending: Emergency Medicine | Admitting: Emergency Medicine

## 2020-11-08 ENCOUNTER — Encounter (HOSPITAL_COMMUNITY): Payer: Self-pay

## 2020-11-08 ENCOUNTER — Emergency Department (HOSPITAL_COMMUNITY)
Admission: EM | Admit: 2020-11-08 | Discharge: 2020-11-08 | Disposition: A | Discharge status: Home / Self Care | Payer: Medicaid Other | Source: Home / Self Care | Attending: Emergency Medicine | Admitting: Emergency Medicine

## 2020-11-08 ENCOUNTER — Other Ambulatory Visit: Payer: Self-pay

## 2020-11-08 ENCOUNTER — Emergency Department (HOSPITAL_COMMUNITY): Payer: Medicaid Other

## 2020-11-08 DIAGNOSIS — R21 Rash and other nonspecific skin eruption: Secondary | ICD-10-CM | POA: Insufficient documentation

## 2020-11-08 DIAGNOSIS — R1084 Generalized abdominal pain: Secondary | ICD-10-CM | POA: Insufficient documentation

## 2020-11-08 DIAGNOSIS — R112 Nausea with vomiting, unspecified: Secondary | ICD-10-CM | POA: Diagnosis not present

## 2020-11-08 DIAGNOSIS — R109 Unspecified abdominal pain: Secondary | ICD-10-CM | POA: Diagnosis not present

## 2020-11-08 DIAGNOSIS — R140 Abdominal distension (gaseous): Secondary | ICD-10-CM | POA: Diagnosis not present

## 2020-11-08 LAB — CBC WITH DIFFERENTIAL/PLATELET
Abs Immature Granulocytes: 0.01 10*3/uL (ref 0.00–0.07)
Basophils Absolute: 0 10*3/uL (ref 0.0–0.1)
Basophils Relative: 1 %
Eosinophils Absolute: 0.3 10*3/uL (ref 0.0–0.5)
Eosinophils Relative: 5 %
HCT: 38.7 % (ref 36.0–46.0)
Hemoglobin: 13 g/dL (ref 12.0–15.0)
Immature Granulocytes: 0 %
Lymphocytes Relative: 40 %
Lymphs Abs: 2.6 10*3/uL (ref 0.7–4.0)
MCH: 30.5 pg (ref 26.0–34.0)
MCHC: 33.6 g/dL (ref 30.0–36.0)
MCV: 90.8 fL (ref 80.0–100.0)
Monocytes Absolute: 0.7 10*3/uL (ref 0.1–1.0)
Monocytes Relative: 10 %
Neutro Abs: 2.9 10*3/uL (ref 1.7–7.7)
Neutrophils Relative %: 44 %
Platelets: 238 10*3/uL (ref 150–400)
RBC: 4.26 MIL/uL (ref 3.87–5.11)
RDW: 12.7 % (ref 11.5–15.5)
WBC: 6.5 10*3/uL (ref 4.0–10.5)
nRBC: 0 % (ref 0.0–0.2)

## 2020-11-08 LAB — I-STAT BETA HCG BLOOD, ED (MC, WL, AP ONLY): I-stat hCG, quantitative: <5 m[IU]/mL (ref <5)

## 2020-11-08 LAB — COMPREHENSIVE METABOLIC PANEL
ALT: 19 U/L (ref 0–44)
AST: 22 U/L (ref 15–41)
Albumin: 3.6 g/dL (ref 3.5–5.0)
Alkaline Phosphatase: 36 U/L — ABNORMAL LOW (ref 38–126)
Anion gap: 10 (ref 5–15)
BUN: 8 mg/dL (ref 6–20)
CO2: 23 mmol/L (ref 22–32)
Calcium: 9.3 mg/dL (ref 8.9–10.3)
Chloride: 103 mmol/L (ref 98–111)
Creatinine, Ser: 0.73 mg/dL (ref 0.44–1.00)
GFR, Estimated: >60 mL/min (ref >60)
Glucose, Bld: 101 mg/dL — ABNORMAL HIGH (ref 70–99)
Potassium: 3.7 mmol/L (ref 3.5–5.1)
Sodium: 136 mmol/L (ref 135–145)
Total Bilirubin: 0.2 mg/dL — ABNORMAL LOW (ref 0.3–1.2)
Total Protein: 7 g/dL (ref 6.5–8.1)

## 2020-11-08 LAB — WET PREP, GENITAL
Clue Cells Wet Prep HPF POC: NONE SEEN
Sperm: NONE SEEN
Trich, Wet Prep: NONE SEEN
Yeast Wet Prep HPF POC: NONE SEEN

## 2020-11-08 LAB — LIPASE, BLOOD: Lipase: 33 U/L (ref 11–51)

## 2020-11-08 LAB — URINALYSIS, ROUTINE W REFLEX MICROSCOPIC
Bilirubin Urine: NEGATIVE
Glucose, UA: NEGATIVE mg/dL
Hgb urine dipstick: NEGATIVE
Ketones, ur: 5 mg/dL — AB
Nitrite: NEGATIVE
Protein, ur: 30 mg/dL — AB
Specific Gravity, Urine: 1.034 — ABNORMAL HIGH (ref 1.005–1.030)
pH: 5 (ref 5.0–8.0)

## 2020-11-08 MED ORDER — FAMOTIDINE 40 MG PO TABS: 40.0000 mg | ORAL_TABLET | Freq: Every day | ORAL | 0 refills | Status: DC

## 2020-11-08 MED ORDER — DICYCLOMINE HCL 20 MG PO TABS: 20.0000 mg | ORAL_TABLET | Freq: Two times a day (BID) | ORAL | 0 refills | Status: DC

## 2020-11-08 MED ORDER — ONDANSETRON 4 MG PO TBDP: 4.0000 mg | ORAL_TABLET | Freq: Three times a day (TID) | ORAL | 0 refills | Status: DC | PRN

## 2020-11-08 NOTE — Discharge Instructions (Addendum)
Please follow-up with your primary care doctor.  I provided you with the prescription for Bentyl as well as Pepcid.  Please take these medications as prescribed you may take Bentyl twice daily as needed for pain.  You may take Pepcid once daily every day this should help improve your symptoms.  As we discussed this will treat your symptoms as they are related to gastric irritation/stomach irritation or intestinal cramping  Your wet prep was without any evidence of infection. Your urinalysis was unremarkable. Notably you do look somewhat dehydrated.  Please drink plenty of water.  Please follow-up with your primary care provider.  Is a good place to start but ultimately following up with your primary care provider will be helpful to see if your symptoms are improving with this treatment.  I have also prescribed you Zofran to have on hand in case you have any nausea.  Drink plenty of water.

## 2020-11-08 NOTE — ED Provider Notes (Signed)
MSE was initiated and I personally evaluated the patient and placed orders (if any) at  3:46 AM on November 08, 2020.  Patient to ED with c/o periumbilical abdominal pain. She has experienced pain off and on for a long time but reports it is now associated with nausea and vomiting. Also concerned for rash that has been going on for 1 week. She reports she 'doesn't feel' well. She is also requesting an STD check up because she has a new partner.   Today's Vitals   11/08/20 0253 11/08/20 0255 11/08/20 0255  BP:   (!) 141/94  Pulse:   87  Resp:   18  Temp:   98.5 F (36.9 C)  TempSrc:   Oral  SpO2:   100%  Weight:  84 kg   Height:  5\' 2"  (1.575 m)   PainSc: 7      Body mass index is 33.87 kg/m.  In NAD.  Abdomen soft No firm or specifically tender mass.  The patient appears stable so that the remainder of the MSE may be completed by another provider.   , PA-C 11/08/20 0348    11/10/20, MD 11/08/20 [phone]

## 2020-11-08 NOTE — ED Notes (Signed)
Urine sent to lab 

## 2020-11-08 NOTE — ED Triage Notes (Addendum)
Pt from home c/o rash for 3 weeks, located on bilateral legs and arms. Pt states no change with products she uses on skin. Pt states no insect bite that she knows of.  Pt also c/o abdominal pain x1 yr from hernia. Pt endorses N/V/D. Pt endorses abdominal distention.

## 2020-11-08 NOTE — ED Triage Notes (Signed)
Pt reports abd cramps that did not allow her to sleep x1 year; hx of hernia. Pt also reports rashes on arms and legs x 1 week. Pt reports bulging of stomach; NVD last week.

## 2020-11-08 NOTE — ED Provider Notes (Signed)
[facility] EMERGENCY DEPARTMENT Provider Note   CSN: [id number] Arrival date & time: 11/08/20  0248     History Chief Complaint  Patient presents with   Abdominal Pain    Heidi Estes is a 25 y.o. female.  HPI Patient with intermittent generalized abdominal cramps for approximately 1 year do not seem to be occurring with her period.  She denies any chest pain or shortness of breath.  States that her abdominal pain is improved since she has been here in the ER.  She had a CT scan that was obtained in triage.  She denies any nausea or vomiting.  No diarrhea.  No lightheadedness or dizziness.  Patient has had some cosmetic surgery done to her abdomen but no intra-abdominal surgeries.  She denies any chest pain short of breath lightheadedness or dizziness.  No fevers or chills.  No new medications.  She states that she has no severe chronic medical problems.  She denies any urinary frequency urgency or dysuria.  States that she would like screening for STDs but denies any vaginal discharge or dyspareunia.      Past Medical History:  Diagnosis Date   Multiple allergies    Postpartum depression    STD (female)     Patient Active Problem List   Diagnosis Date Noted   Post term pregnancy, 41 weeks 03/04/2015   Obesity affecting pregnancy in third trimester, antepartum     Past Surgical History:  Procedure Laterality Date   COSMETIC SURGERY N/A    tummy tuck   WISDOM TOOTH EXTRACTION Bilateral 07/2013   x4     OB History     Gravida  3   Para  2   Term  2   Preterm  0   AB      Living  2      SAB      IAB      Ectopic      Multiple  0   Live Births  2           Family History  Problem Relation Age of Onset   Asthma Mother    Hypertension Mother    Hypertension Maternal Aunt     Social History   Tobacco Use   Smoking status: Never   Smokeless tobacco: Never  Vaping Use   Vaping Use: Never used  Substance Use  Topics   Alcohol use: Yes    Comment: Occassional    Drug use: No    Home Medications Prior to Admission medications   Medication Sig Start Date End Date Taking? Authorizing Provider  dicyclomine (BENTYL) 20 MG tablet Take 1 tablet (20 mg total) by mouth 2 (two) times daily. 11/08/20  Yes Trevante Tennell S, PA  famotidine (PEPCID) 40 MG tablet Take 1 tablet (40 mg total) by mouth daily for 14 days. 11/08/20 11/22/20 Yes Jequan Shahin S, PA  ondansetron (ZOFRAN ODT) 4 MG disintegrating tablet Take 1 tablet (4 mg total) by mouth every 8 (eight) hours as needed for nausea or vomiting. 11/08/20  Yes Zailynn Brandel S, PA  fluconazole (DIFLUCAN) 150 MG tablet Take 1 tablet on day 1, repeat in 72 hours if your vaginal symptoms have not resolved. 08/03/20   Petrucelli, Samantha R, PA-C  metroNIDAZOLE (FLAGYL) 500 MG tablet Take 1 tablet (500 mg total) by mouth 2 (two) times daily. 08/03/20   Petrucelli, Samantha R, PA-C    Allergies    Macrobid [nitrofurantoin monohyd macro]  Review of Systems   Review of Systems  Constitutional:  Negative for chills and fever.  HENT:  Negative for congestion.   Eyes:  Negative for pain.  Respiratory:  Negative for cough and shortness of breath.   Cardiovascular:  Negative for chest pain and leg swelling.  Gastrointestinal:  Positive for abdominal pain. Negative for diarrhea, nausea and vomiting.  Genitourinary:  Negative for dysuria.  Musculoskeletal:  Negative for myalgias.  Skin:  Negative for rash.  Neurological:  Negative for dizziness and headaches.   Physical Exam Updated Vital Signs BP 124/85   Pulse 74   Temp 98.5 F (36.9 C) (Oral)   Resp 17   Ht 5\' 2"  (1.575 m)   Wt 84 kg   LMP 10/11/2020 (Approximate)   SpO2 100%   BMI 33.87 kg/m   Physical Exam Vitals and nursing note reviewed.  Constitutional:      General: She is not in acute distress. HENT:     Head: Normocephalic and atraumatic.     Nose: Nose normal.  Eyes:     General: No  scleral icterus. Cardiovascular:     Rate and Rhythm: Normal rate and regular rhythm.     Pulses: Normal pulses.     Heart sounds: Normal heart sounds.  Pulmonary:     Effort: Pulmonary effort is normal. No respiratory distress.     Breath sounds: No wheezing.  Abdominal:     Palpations: Abdomen is soft.     Tenderness: There is abdominal tenderness.     Comments: Abdomen is obese, no focal TTP Neg murphy/negative mcburney   Musculoskeletal:     Cervical back: Normal range of motion.     Right lower leg: No edema.     Left lower leg: No edema.  Skin:    General: Skin is warm and dry.     Capillary Refill: Capillary refill takes less than 2 seconds.  Neurological:     Mental Status: She is alert. Mental status is at baseline.  Psychiatric:        Mood and Affect: Mood normal.        Behavior: Behavior normal.    ED Results / Procedures / Treatments   Labs (all labs ordered are listed, but only abnormal results are displayed) Labs Reviewed  WET PREP, GENITAL - Abnormal; Notable for the following components:      Result Value   WBC, Wet Prep HPF POC MANY (*)    All other components within normal limits  COMPREHENSIVE METABOLIC PANEL - Abnormal; Notable for the following components:   Glucose, Bld 101 (*)    Alkaline Phosphatase 36 (*)    Total Bilirubin 0.2 (*)    All other components within normal limits  URINALYSIS, ROUTINE W REFLEX MICROSCOPIC - Abnormal; Notable for the following components:   Color, Urine AMBER (*)    APPearance HAZY (*)    Specific Gravity, Urine 1.034 (*)    Ketones, ur 5 (*)    Protein, ur 30 (*)    Leukocytes,Ua SMALL (*)    Bacteria, UA FEW (*)    All other components within normal limits  CBC WITH DIFFERENTIAL/PLATELET  LIPASE, BLOOD  I-STAT BETA HCG BLOOD, ED ([facility], [facility], [facility] ONLY)  GC/CHLAMYDIA PROBE AMP ([place]) NOT AT [facility]    EKG None  Radiology CT ABDOMEN PELVIS WO CONTRAST  Result Date: 11/08/2020 CLINICAL DATA:  Abdominal  pain with hernia suspected EXAM: CT ABDOMEN AND PELVIS WITHOUT CONTRAST TECHNIQUE: Multidetector CT  imaging of the abdomen and pelvis was performed following the standard protocol without IV contrast. COMPARISON:  None. FINDINGS: Lower chest:  No contributory findings. Hepatobiliary: No focal liver abnormality.No evidence of biliary obstruction or stone. Pancreas: Unremarkable. Spleen: Unremarkable. Adrenals/Urinary Tract: Negative adrenals. No hydronephrosis or stone. Unremarkable bladder. Stomach/Bowel:  No obstruction. No appendicitis. Vascular/Lymphatic: No acute vascular abnormality. No mass or adenopathy. Reproductive:No pathologic findings. Other: No ascites or pneumoperitoneum. Musculoskeletal: No acute abnormalities.  Abdominal plasty. IMPRESSION: No explanation for abdominal pain. Electronically Signed   By: Marnee Spring M.D.   On: 11/08/2020 06:26    Procedures Procedures   Medications Ordered in ED Medications - No data to display  ED Course  I have reviewed the triage vital signs and the nursing notes.  Pertinent labs & imaging results that were available during my care of the patient were reviewed by me and considered in my medical decision making (see chart for details).  Well-appearing 25 year old female with 1 year of abdominal pain that has been intermittent.  Clinical Course as of 11/08/20 1322  Wed Nov 08, 2020  1106 CT scan unremarkable:  Stomach/Bowel:  No obstruction. No appendicitis.  Normal scan [WF]  1319 CMP unremarkable, CBC without leukocytosis or anemia.  I-STAT hCG negative for pregnancy lipase within normal limits doubt pancreatitis.  GC chlamydia probe collected via self swab at her request.  Wet prep relatively unremarkable.  No trichomonas, clue cells or yeast present. [WF]  1320 Patient's work-up today has been unremarkable. Plan to discharge home with conservative therapy/Bentyl, Zofran, Pepcid.  She will follow-up with her PCP to recheck.  Also  recommended follow-up with the [facility] department public health for HIV/syphilis screening. [WF]    Clinical Course User Index [WF] Gailen Shelter, [place]   MDM Rules/Calculators/A&P                          Patient able to plan.  Tolerated p.o.  Ambulate without difficulty to and from bathroom.  Patient discharged home.  Final Clinical Impression(s) / ED Diagnoses Final diagnoses:  Generalized abdominal pain    Rx / DC Orders ED Discharge Orders          Ordered    dicyclomine (BENTYL) 20 MG tablet  2 times daily        11/08/20 1257    famotidine (PEPCID) 40 MG tablet  Daily        11/08/20 1257    ondansetron (ZOFRAN ODT) 4 MG disintegrating tablet  Every 8 hours PRN        11/08/20 1257             Solon Augusta [place], [place] 11/08/20 1322    Vanetta Mulders, MD 11/16/20 1350

## 2020-11-09 LAB — GC/CHLAMYDIA PROBE AMP (~~LOC~~) NOT AT ARMC

## 2021-01-01 ENCOUNTER — Other Ambulatory Visit: Payer: Self-pay

## 2021-01-01 ENCOUNTER — Emergency Department (HOSPITAL_COMMUNITY)
Admission: EM | Admit: 2021-01-01 | Discharge: 2021-01-01 | Disposition: A | Discharge status: Home / Self Care | Payer: Medicaid Other | Attending: Student | Admitting: Student

## 2021-01-01 DIAGNOSIS — Z202 Contact with and (suspected) exposure to infections with a predominantly sexual mode of transmission: Secondary | ICD-10-CM | POA: Diagnosis not present

## 2021-01-01 DIAGNOSIS — R110 Nausea: Secondary | ICD-10-CM | POA: Diagnosis not present

## 2021-01-01 DIAGNOSIS — B9689 Other specified bacterial agents as the cause of diseases classified elsewhere: Secondary | ICD-10-CM

## 2021-01-01 DIAGNOSIS — N898 Other specified noninflammatory disorders of vagina: Secondary | ICD-10-CM | POA: Insufficient documentation

## 2021-01-01 LAB — HIV ANTIBODY (ROUTINE TESTING W REFLEX): HIV Screen 4th Generation wRfx: NONREACTIVE

## 2021-01-01 LAB — WET PREP, GENITAL
Sperm: NONE SEEN
Trich, Wet Prep: NONE SEEN
Yeast Wet Prep HPF POC: NONE SEEN

## 2021-01-01 LAB — RAPID HIV SCREEN (HIV 1/2 AB+AG)
HIV 1/2 Antibodies: NONREACTIVE
HIV-1 P24 Antigen - HIV24: NONREACTIVE

## 2021-01-01 MED ORDER — METRONIDAZOLE 500 MG PO TABS: 500.0000 mg | ORAL_TABLET | Freq: Two times a day (BID) | ORAL | 0 refills | Status: DC

## 2021-01-01 MED ORDER — ONDANSETRON HCL 4 MG PO TABS: 4.0000 mg | ORAL_TABLET | Freq: Four times a day (QID) | ORAL | 0 refills | Status: DC

## 2021-01-01 NOTE — Discharge Instructions (Addendum)
The results from your STD screening will show up on MyChart.  If you are positive you should receive a call, but if you do not please make sure you check the results as they come in.  You can also take Zofran as needed for nausea.  Continue to use condoms when practicing sex.  If things change or worsen please return back to the ED as needed.  Additionally, you have bacterial vaginosis.  Please take Flagyl twice daily for the next 7 days.

## 2021-01-01 NOTE — ED Triage Notes (Signed)
Pt reports n/v, light headed x 2 weeks and rash to left arm that improved with calamine Pt reports odor to vagina and would like STD check.

## 2021-01-01 NOTE — ED Provider Notes (Signed)
[facility]   CSN: [id number] Arrival date & time: 01/01/21  1002     History Chief Complaint  Patient presents with   SEXUALLY TRANSMITTED DISEASE   Nausea    Heidi Estes is a 25 y.o. female.  HPI  Patient presents with STD symptoms.  For the last month she has been having vaginal discharge.  She also has a new partner with whom she is having protected sex.  Her partner is asymptomatic.  She has been feeling nauseated, but denies any dysuria or dyspareunia.  Has tried Tylenol ibuprofen with minimal relief.  No new douching products.  Past Medical History:  Diagnosis Date   Multiple allergies    Postpartum depression    STD (female)     Patient Active Problem List   Diagnosis Date Noted   Post term pregnancy, 41 weeks 03/04/2015   Obesity affecting pregnancy in third trimester, antepartum     Past Surgical History:  Procedure Laterality Date   COSMETIC SURGERY N/A    tummy tuck   WISDOM TOOTH EXTRACTION Bilateral 07/2013   x4     OB History     Gravida  3   Para  2   Term  2   Preterm  0   AB      Living  2      SAB      IAB      Ectopic      Multiple  0   Live Births  2           Family History  Problem Relation Age of Onset   Asthma Mother    Hypertension Mother    Hypertension Maternal Aunt     Social History   Tobacco Use   Smoking status: Never   Smokeless tobacco: Never  Vaping Use   Vaping Use: Never used  Substance Use Topics   Alcohol use: Yes    Comment: Occassional    Drug use: No    Home Medications Prior to Admission medications   Medication Sig Start Date End Date Taking? Authorizing Provider  dicyclomine (BENTYL) 20 MG tablet Take 1 tablet (20 mg total) by mouth 2 (two) times daily. 11/08/20   Gailen Shelter, PA  famotidine (PEPCID) 40 MG tablet Take 1 tablet (40 mg total) by mouth daily for 14 days. 11/08/20 11/22/20  Gailen Shelter, PA  fluconazole  (DIFLUCAN) 150 MG tablet Take 1 tablet on day 1, repeat in 72 hours if your vaginal symptoms have not resolved. 08/03/20   Petrucelli, Samantha R, PA-C  metroNIDAZOLE (FLAGYL) 500 MG tablet Take 1 tablet (500 mg total) by mouth 2 (two) times daily. 08/03/20   Petrucelli, Samantha R, PA-C  ondansetron (ZOFRAN ODT) 4 MG disintegrating tablet Take 1 tablet (4 mg total) by mouth every 8 (eight) hours as needed for nausea or vomiting. 11/08/20   Gailen Shelter, PA    Allergies    Macrobid [nitrofurantoin monohyd macro] and Other  Review of Systems   Review of Systems  Constitutional:  Negative for fever.  Gastrointestinal:  Positive for nausea. Negative for abdominal pain and vomiting.  Genitourinary:  Positive for vaginal discharge. Negative for dysuria and hematuria.   Physical Exam Updated Vital Signs BP (!) 128/101   Pulse 85   Temp 98 F (36.7 C)   Resp 20   Ht 5\' 2"  (1.575 m)   Wt 84 kg   LMP 12/24/2020  SpO2 100%   BMI 33.87 kg/m   Physical Exam Vitals and nursing note reviewed. Exam conducted with a chaperone present.  Constitutional:      General: She is not in acute distress.    Appearance: Normal appearance.  HENT:     Head: Normocephalic and atraumatic.  Eyes:     General: No scleral icterus.    Extraocular Movements: Extraocular movements intact.     Pupils: Pupils are equal, round, and reactive to light.  Cardiovascular:     Rate and Rhythm: Normal rate and regular rhythm.  Pulmonary:     Effort: Pulmonary effort is normal.     Breath sounds: Normal breath sounds.  Abdominal:     General: Abdomen is flat.     Tenderness: There is no abdominal tenderness.  Genitourinary:    Exam position: Knee-chest position.     Comments: Opaque, white vaginal discharge noted on exam, no bleeding.  No cervix goal wall motion tenderness.  Cervical os is closed. Skin:    Coloration: Skin is not jaundiced.  Neurological:     Mental Status: She is alert. Mental status is at  baseline.     Coordination: Coordination normal.    ED Results / Procedures / Treatments   Labs (all labs ordered are listed, but only abnormal results are displayed) Labs Reviewed  WET PREP, GENITAL  HIV ANTIBODY (ROUTINE TESTING W REFLEX)  RAPID HIV SCREEN (HIV 1/2 AB+AG)  RPR  GC/CHLAMYDIA PROBE AMP ([place]) NOT AT [facility]    EKG None  Radiology No results found.  Procedures Procedures   Medications Ordered in ED Medications - No data to display  ED Course  I have reviewed the triage vital signs and the nursing notes.  Pertinent labs & imaging results that were available during my care of the patient were reviewed by me and considered in my medical decision making (see chart for details).    MDM Rules/Calculators/A&P                           Patient vitals are stable, she is not in any acute distress or icterus or toxic.  Pelvic exam is relatively unremarkable, will do wet prep to assess for BV and trichomoniasis.  We will also check for GC and chlamydia given that in June the results did not show enough sample to result accurately.  Work-up notable for BV, will treat with metronidazole.  Patient advised to check MyChart for gonorrhea chlamydia.  She will also check to see the results of her STD test.  I advised her to go to the health clinic in the future for free STD testing.  Also advised her to follow-up with a primary care doctor and given her resources for procuring one.  She is appropriate for discharge at this time.  Final Clinical Impression(s) / ED Diagnoses Final diagnoses:  None    Rx / DC Orders ED Discharge Orders     None        Theron Arista, PA-C 01/01/21 1452    Glendora Score, MD 01/01/21 1622

## 2021-01-02 LAB — GC/CHLAMYDIA PROBE AMP (~~LOC~~) NOT AT ARMC
Chlamydia: POSITIVE — AB
Comment: NEGATIVE
Comment: NORMAL
Neisseria Gonorrhea: NEGATIVE

## 2021-01-02 LAB — RPR: RPR Ser Ql: NONREACTIVE

## 2021-01-09 ENCOUNTER — Other Ambulatory Visit: Payer: Self-pay

## 2021-01-09 ENCOUNTER — Emergency Department (HOSPITAL_COMMUNITY)
Admission: EM | Admit: 2021-01-09 | Discharge: 2021-01-09 | Disposition: A | Discharge status: Home / Self Care | Payer: Medicaid Other | Attending: Emergency Medicine | Admitting: Emergency Medicine

## 2021-01-09 ENCOUNTER — Encounter (HOSPITAL_COMMUNITY): Payer: Self-pay

## 2021-01-09 DIAGNOSIS — N3000 Acute cystitis without hematuria: Secondary | ICD-10-CM | POA: Diagnosis not present

## 2021-01-09 DIAGNOSIS — A749 Chlamydial infection, unspecified: Secondary | ICD-10-CM | POA: Insufficient documentation

## 2021-01-09 DIAGNOSIS — F17290 Nicotine dependence, other tobacco product, uncomplicated: Secondary | ICD-10-CM | POA: Diagnosis not present

## 2021-01-09 DIAGNOSIS — B9689 Other specified bacterial agents as the cause of diseases classified elsewhere: Secondary | ICD-10-CM | POA: Insufficient documentation

## 2021-01-09 DIAGNOSIS — N898 Other specified noninflammatory disorders of vagina: Secondary | ICD-10-CM | POA: Diagnosis present

## 2021-01-09 LAB — URINALYSIS, ROUTINE W REFLEX MICROSCOPIC
Bilirubin Urine: NEGATIVE
Glucose, UA: NEGATIVE mg/dL
Hgb urine dipstick: NEGATIVE
Ketones, ur: NEGATIVE mg/dL
Nitrite: NEGATIVE
Protein, ur: NEGATIVE mg/dL
Specific Gravity, Urine: 1.023 (ref 1.005–1.030)
pH: 5 (ref 5.0–8.0)

## 2021-01-09 LAB — PREGNANCY, URINE: Preg Test, Ur: NEGATIVE

## 2021-01-09 LAB — POC URINE PREG, ED: Preg Test, Ur: NEGATIVE

## 2021-01-09 MED ORDER — CEPHALEXIN 500 MG PO CAPS: 500.0000 mg | ORAL_CAPSULE | Freq: Two times a day (BID) | ORAL | 0 refills | Status: AC

## 2021-01-09 MED ORDER — DOXYCYCLINE HYCLATE 100 MG PO CAPS: 100.0000 mg | ORAL_CAPSULE | Freq: Two times a day (BID) | ORAL | 0 refills | Status: AC

## 2021-01-09 MED ORDER — DOXYCYCLINE HYCLATE 100 MG PO TABS
100.0000 mg | ORAL_TABLET | Freq: Once | ORAL | Status: AC
  Administered 2021-01-09: 100 mg via ORAL
  Filled 2021-01-09: qty 1

## 2021-01-09 NOTE — Care Management (Signed)
[facility] ED RN Care Manager consulted concerning patient needing assistance with medications. This patient has health insurance which covers her medications with a $3 co-pay. Updated EDP in the ED at [facility]

## 2021-01-09 NOTE — Discharge Instructions (Addendum)
  You were evaluated in the Emergency Department and after careful evaluation, we did not find any emergent condition requiring admission or further testing in the hospital.   Your exam/testing today was overall reassuring.  I prescribed you medications for chlamydia, do not have intercourse for the next 2 weeks.  Please have your partner also get tested for this and treated.  Please use the following attached instructions.  I also prescribed you antibiotics for a urinary tract infection, I want you to follow-up with your primary care doctor in the next couple of days for symptom check.  If you develop worsening back pain, vomiting, fevers, pelvic pain then please come back to the emergency department.  If you have any issues getting her medications please call the number in this packet.  Please make sure to wear sunscreen when taking the doxycycline. Please return to the Emergency Department if you experience any worsening of your condition.  Thank you for allowing [place] to be a part of your care. Please speak to your pharmacist about any new medications prescribed today in regards to side effects or interactions with other medications.

## 2021-01-09 NOTE — ED Provider Notes (Signed)
[facility] Provider Note   CSN: [id number] Arrival date & time: 01/09/21  1059     History Chief Complaint  Patient presents with   SEXUALLY TRANSMITTED DISEASE   Back Pain    Heidi Estes is a 25 y.o. female presents emerged department today for treatment for STD.  Patient states that she was diagnosed with chlamydia recently, per chart review patient came to the emergency department on the 15th, had STD testing done after an exposure, only positive for chlamydia at that time.  Patient states that she continues to have vaginal discharge, states that she is having to vaginal discharge for the past 2 months.  Patient states that she has not had intercourse with this person for the past 3 weeks, has not had intercourse at all for the past 3 weeks.  Patient denies any pelvic pain or vaginal bleeding.  Does admit to some dysuria and frequent urination over the past couple of days, denies any hematuria.  Patient does states that she has some right lower back pain that started a week ago, states that it is a cramping sensation, does not radiate anywhere.  States that the pain comes and goes.  Denies any nausea, vomiting, abdominal pain, fevers, change in bowel habits.  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, antepartum     Past Surgical History:  Procedure Laterality Date   COSMETIC SURGERY N/A    tummy tuck   WISDOM TOOTH EXTRACTION Bilateral 07/2013   x4     OB History     Gravida  3   Para  2   Term  2   Preterm  0   AB      Living  2      SAB      IAB      Ectopic      Multiple  0   Live Births  2           Family History  Problem Relation Age of Onset   Asthma Mother    Hypertension Mother     Hypertension Maternal Aunt     Social History   Tobacco Use   Smoking status: Some Days    Types: Cigars   Smokeless tobacco: Never  Vaping Use   Vaping Use: Never used  Substance Use Topics   Alcohol use: Yes    Comment: Occassional    Drug use: No    Home Medications Prior to Admission medications   Medication Sig Start Date End Date Taking? Authorizing Provider  cephALEXin (KEFLEX) 500 MG capsule Take 1 capsule (500 mg total) by mouth 2 (two) times daily for 7 days. 01/09/21 01/16/21 Yes Jaquelyn Sakamoto, Donne AnonShalyn, PA-C  doxycycline (VIBRAMYCIN) 100 MG capsule Take 1 capsule (100 mg total) by mouth 2 (two) times daily for 7 days. 01/09/21 01/16/21 Yes Carilyn Woolston, PA-C  dicyclomine (BENTYL) 20 MG tablet Take 1 tablet (20 mg total) by mouth 2 (two) times daily. 11/08/20   Gailen ShelterFondaw, Wylder S, PA  famotidine (PEPCID) 40 MG tablet Take 1 tablet (40 mg total) by mouth daily for 14 days. 11/08/20 11/22/20  Gailen ShelterFondaw, Wylder S, PA  fluconazole (DIFLUCAN) 150 MG tablet Take 1 tablet on  day 1, repeat in 72 hours if your vaginal symptoms have not resolved. 08/03/20   Petrucelli, Samantha R, PA-C  metroNIDAZOLE (FLAGYL) 500 MG tablet Take 1 tablet (500 mg total) by mouth 2 (two) times daily. 01/01/21   Theron Arista, PA-C  ondansetron (ZOFRAN ODT) 4 MG disintegrating tablet Take 1 tablet (4 mg total) by mouth every 8 (eight) hours as needed for nausea or vomiting. 11/08/20   Fondaw, Rodrigo Ran, PA  ondansetron (ZOFRAN) 4 MG tablet Take 1 tablet (4 mg total) by mouth every 6 (six) hours. 01/01/21   Theron Arista, PA-C    Allergies    Macrobid [nitrofurantoin monohyd macro] and Other  Review of Systems   Review of Systems  Constitutional:  Negative for chills, diaphoresis, fatigue and fever.  HENT:  Negative for congestion, sore throat and trouble swallowing.   Eyes:  Negative for pain and visual disturbance.  Respiratory:  Negative for cough, shortness of breath and wheezing.   Cardiovascular:  Negative for chest  pain, palpitations and leg swelling.  Gastrointestinal:  Negative for abdominal distention, abdominal pain, diarrhea, nausea and vomiting.  Genitourinary:  Positive for dysuria, frequency and vaginal discharge. Negative for decreased urine volume, difficulty urinating, dyspareunia, flank pain, genital sores, menstrual problem, pelvic pain and vaginal bleeding.  Musculoskeletal:  Negative for back pain, neck pain and neck stiffness.  Skin:  Negative for pallor.  Neurological:  Negative for dizziness, speech difficulty, weakness and headaches.  Psychiatric/Behavioral:  Negative for confusion.    Physical Exam Updated Vital Signs BP (!) 112/100 (BP Location: Right Arm)   Pulse 69   Temp 98.5 F (36.9 C) (Oral)   Resp 18   Ht 5\' 1"  (1.549 m)   Wt 88.5 kg   LMP 12/24/2020   SpO2 100%   BMI 36.84 kg/m   Physical Exam Constitutional:      General: She is not in acute distress.    Appearance: Normal appearance. She is not ill-appearing, toxic-appearing or diaphoretic.  HENT:     Mouth/Throat:     Mouth: Mucous membranes are moist.     Pharynx: Oropharynx is clear.  Eyes:     General: No scleral icterus.    Extraocular Movements: Extraocular movements intact.     Pupils: Pupils are equal, round, and reactive to light.  Cardiovascular:     Rate and Rhythm: Normal rate and regular rhythm.     Pulses: Normal pulses.     Heart sounds: Normal heart sounds.  Pulmonary:     Effort: Pulmonary effort is normal. No respiratory distress.     Breath sounds: Normal breath sounds. No stridor. No wheezing, rhonchi or rales.  Chest:     Chest wall: No tenderness.  Abdominal:     General: Abdomen is flat. There is no distension.     Palpations: Abdomen is soft.     Tenderness: There is no abdominal tenderness. There is no right CVA tenderness, left CVA tenderness, guarding or rebound.     Comments: NO CVA tenderness  Genitourinary:    Comments: Chaperone present.  Pelvic exam done, mild amount  of discharge noted and vaginal vault.  No bleeding noted.  Cervix is closed, nonfriable.  No pain with speculum exam.  No cervical motion or adnexal tenderness. Musculoskeletal:        General: No swelling or tenderness. Normal range of motion.     Cervical back: Normal range of motion and neck supple. No rigidity.     Right lower leg:  No edema.     Left lower leg: No edema.  Skin:    General: Skin is warm and dry.     Capillary Refill: Capillary refill takes less than 2 seconds.     Coloration: Skin is not pale.  Neurological:     General: No focal deficit present.     Mental Status: She is alert and oriented to person, place, and time.  Psychiatric:        Mood and Affect: Mood normal.        Behavior: Behavior normal.    ED Results / Procedures / Treatments   Labs (all labs ordered are listed, but only abnormal results are displayed) Labs Reviewed  URINALYSIS, ROUTINE W REFLEX MICROSCOPIC - Abnormal; Notable for the following components:      Result Value   APPearance HAZY (*)    Leukocytes,Ua MODERATE (*)    Bacteria, UA FEW (*)    All other components within normal limits  URINE CULTURE  PREGNANCY, URINE  POC URINE PREG, ED    EKG None  Radiology No results found.  Procedures Procedures   Medications Ordered in ED Medications  doxycycline (VIBRA-TABS) tablet 100 mg (100 mg Oral Given 01/09/21 1923)    ED Course  I have reviewed the triage vital signs and the nursing notes.  Pertinent labs & imaging results that were available during my care of the patient were reviewed by me and considered in my medical decision making (see chart for details).    MDM Rules/Calculators/A&P                          Heidi Estes is a 25 y.o. female presents emerged department today for treatment for STD.  Patient has not had intercourse after coming into the emergency department and getting tested for STDs a week ago, at that time had only positive chlamydia, was tested  for all other STDs.  Pelvic exam without any concerns for PID, although back pain could be related to STD, reassuring that patient has normal vitals, no pelvic pain, normal pelvic exam.  Patient without any CVA tenderness.  Does have some urinary symptoms, UA with moderate leukocytes, this could be from chlamydia however could also be UTI, will obtain culture and treat for urinary tract infection.  Do not think that patient has pyelonephritis or other ascending infection with normal vital signs, patient appears very well.  Patient has been afebrile and is not vomiting.  I think muscle spasms in the back are more likely, symptomatic treatment discussed.  Will give doxycycline and Keflex at this time, patient will follow up with PCP.  Patient agreeable on plan.  Doubt need for further emergent work up at this time. I explained the diagnosis and have given explicit precautions to return to the ER including for any other new or worsening symptoms. The patient understands and accepts the medical plan as it's been dictated and I have answered their questions. Discharge instructions concerning home care and prescriptions have been given. The patient is STABLE and is discharged to home in good condition.  I discussed this case with my attending physician who cosigned this note including patient's presenting symptoms, physical exam, and planned diagnostics and interventions. Attending physician stated agreement with plan or made changes to plan which were implemented.    Final Clinical Impression(s) / ED Diagnoses Final diagnoses:  Chlamydia  Acute cystitis without hematuria    Rx / DC Orders ED  Discharge Orders          Ordered    doxycycline (VIBRAMYCIN) 100 MG capsule  2 times daily        01/09/21 1918    cephALEXin (KEFLEX) 500 MG capsule  2 times daily        01/09/21 1959             Farrel Gordon, PA-C 01/09/21 2231    Derwood Kaplan, MD 01/10/21 1759

## 2021-01-09 NOTE — ED Triage Notes (Addendum)
Patient states she was called by the hospital and was informed that she tested positive for chlamydia   patiaent also c/o right lower back pain and states "has been hurting for a little while."

## 2021-01-11 LAB — URINE CULTURE

## 2021-02-28 ENCOUNTER — Encounter (HOSPITAL_COMMUNITY): Payer: Self-pay

## 2021-02-28 ENCOUNTER — Emergency Department (HOSPITAL_COMMUNITY)
Admission: EM | Admit: 2021-02-28 | Discharge: 2021-03-01 | Disposition: A | Discharge status: Home / Self Care | Payer: Medicaid Other | Attending: Emergency Medicine | Admitting: Emergency Medicine

## 2021-02-28 ENCOUNTER — Other Ambulatory Visit: Payer: Self-pay

## 2021-02-28 DIAGNOSIS — F17290 Nicotine dependence, other tobacco product, uncomplicated: Secondary | ICD-10-CM | POA: Insufficient documentation

## 2021-02-28 DIAGNOSIS — J029 Acute pharyngitis, unspecified: Secondary | ICD-10-CM | POA: Diagnosis present

## 2021-02-28 DIAGNOSIS — J039 Acute tonsillitis, unspecified: Secondary | ICD-10-CM | POA: Insufficient documentation

## 2021-02-28 DIAGNOSIS — N898 Other specified noninflammatory disorders of vagina: Secondary | ICD-10-CM | POA: Insufficient documentation

## 2021-02-28 LAB — GROUP A STREP BY PCR: Group A Strep by PCR: NOT DETECTED

## 2021-02-28 NOTE — ED Provider Notes (Signed)
Emergency Medicine Provider Triage Evaluation Note  Heidi Estes , a 25 y.o. female  was evaluated in triage.  Pt complains of sore throat.  She states that since today she has had worsening sore throat.  It hurts more on the left side than on the right.  She has pain when she swallows.  She reports body aches, and headache.  No cough or shortness of breath.  Review of Systems  Positive: Sore throat, fever Negative: Cough, shortness of breath  Physical Exam  BP 140/84 (BP Location: Left Arm)   Pulse (!) 113   Temp 99.8 F (37.7 C) (Oral)   Resp 18   Ht 5\' 1"  (1.549 m)   Wt 83.9 kg   SpO2 99%   BMI 34.96 kg/m  Gen:   Awake, no distress   Resp:  Normal effort  MSK:   Moves extremities without difficulty  Other:  Bilateral tonsillar enlargement with exudates bilaterally.  Uvula is midline.  Medical Decision Making  Medically screening exam initiated at 9:28 PM.  Appropriate orders placed.  HADLEI STITT was informed that the remainder of the evaluation will be completed by another provider, this initial triage assessment does not replace that evaluation, and the importance of remaining in the ED until their evaluation is complete.  Patient did recently have STI about a month ago.  She states that she was treated and took her antibiotics, and ever never followed up for test of cure. If her strep test is negative we will need consideration of chlamydial or gonococcal pharyngitis, mono.  Note: Portions of this report may have been transcribed using voice recognition software. Every effort was made to ensure accuracy; however, inadvertent computerized transcription errors may be present    Jackelyn Knife 02/28/21 2130    2131, MD 03/01/21 1430

## 2021-02-28 NOTE — ED Triage Notes (Signed)
Pt reports sore throat for a few days and vaginal discharge and discomfort. She reports being treated for STD recently, but symptom never resolved.

## 2021-03-01 LAB — WET PREP, GENITAL
Sperm: NONE SEEN
Trich, Wet Prep: NONE SEEN
Yeast Wet Prep HPF POC: NONE SEEN

## 2021-03-01 MED ORDER — AMOXICILLIN 500 MG PO CAPS: 500.0000 mg | ORAL_CAPSULE | Freq: Three times a day (TID) | ORAL | 0 refills | Status: DC

## 2021-03-01 MED ORDER — IBUPROFEN 800 MG PO TABS
800.0000 mg | ORAL_TABLET | Freq: Once | ORAL | Status: AC
  Administered 2021-03-01: 800 mg via ORAL
  Filled 2021-03-01: qty 1

## 2021-03-01 MED ORDER — AMOXICILLIN 500 MG PO CAPS
500.0000 mg | ORAL_CAPSULE | Freq: Once | ORAL | Status: AC
  Administered 2021-03-01: 500 mg via ORAL
  Filled 2021-03-01: qty 1

## 2021-03-01 NOTE — Discharge Instructions (Addendum)
We will call you with any positive test results.  Take antibiotics as directed.

## 2021-03-01 NOTE — ED Provider Notes (Signed)
[facility]-EMERGENCY DEPT Provider Note   CSN: [id number] Arrival date & time: 02/28/21  2103     History Chief Complaint  Patient presents with   Sore Throat   Vaginal Discharge    Heidi Estes is a 25 y.o. female.  Patient presents to the emergency department with a chief complaint of sore throat.  She states that her sore throat has gradually worsened over the past couple of days.  Is worsened with swallowing.  She denies any fever, chills, or headache.  She denies any successful treatments prior to arrival.  She also states that she has had some vaginal discharge.  The history is provided by the patient. No language interpreter was used.      Past Medical History:  Diagnosis Date   Multiple allergies    Postpartum depression    STD (female)     Patient Active Problem List   Diagnosis Date Noted   Post term pregnancy, 41 weeks 03/04/2015   Obesity affecting pregnancy in third trimester, antepartum     Past Surgical History:  Procedure Laterality Date   COSMETIC SURGERY N/A    tummy tuck   WISDOM TOOTH EXTRACTION Bilateral 07/2013   x4     OB History     Gravida  3   Para  2   Term  2   Preterm  0   AB      Living  2      SAB      IAB      Ectopic      Multiple  0   Live Births  2           Family History  Problem Relation Age of Onset   Asthma Mother    Hypertension Mother    Hypertension Maternal Aunt     Social History   Tobacco Use   Smoking status: Some Days    Types: Cigars   Smokeless tobacco: Never  Vaping Use   Vaping Use: Never used  Substance Use Topics   Alcohol use: Yes    Comment: Occassional    Drug use: No    Home Medications Prior to Admission medications   Medication Sig Start Date End Date Taking? Authorizing Provider  amoxicillin (AMOXIL) 500 MG capsule Take 1 capsule (500 mg total) by mouth 3 (three) times daily. 03/01/21  Yes Roxy Horseman, PA-C  dicyclomine (BENTYL)  20 MG tablet Take 1 tablet (20 mg total) by mouth 2 (two) times daily. 11/08/20   Gailen Shelter, PA  famotidine (PEPCID) 40 MG tablet Take 1 tablet (40 mg total) by mouth daily for 14 days. 11/08/20 11/22/20  Gailen Shelter, PA  fluconazole (DIFLUCAN) 150 MG tablet Take 1 tablet on day 1, repeat in 72 hours if your vaginal symptoms have not resolved. 08/03/20   Petrucelli, Samantha R, PA-C  metroNIDAZOLE (FLAGYL) 500 MG tablet Take 1 tablet (500 mg total) by mouth 2 (two) times daily. 01/01/21   Theron Arista, PA-C  ondansetron (ZOFRAN ODT) 4 MG disintegrating tablet Take 1 tablet (4 mg total) by mouth every 8 (eight) hours as needed for nausea or vomiting. 11/08/20   Fondaw, Rodrigo Ran, PA  ondansetron (ZOFRAN) 4 MG tablet Take 1 tablet (4 mg total) by mouth every 6 (six) hours. 01/01/21   Theron Arista, PA-C    Allergies    Macrobid [nitrofurantoin monohyd macro] and Other  Review of Systems   Review of Systems  All other  systems reviewed and are negative.  Physical Exam Updated Vital Signs BP 126/87   Pulse 80   Temp 99.8 F (37.7 C) (Oral)   Resp 16   Ht 5\' 1"  (1.549 m)   Wt 83.9 kg   SpO2 99%   BMI 34.96 kg/m   Physical Exam Vitals and nursing note reviewed.  Constitutional:      General: She is not in acute distress.    Appearance: She is well-developed.  HENT:     Head: Normocephalic and atraumatic.     Mouth/Throat:     Comments: Oropharynx is erythematous with exudate and mild edema, but no evidence of peritonsillar abscess Eyes:     Conjunctiva/sclera: Conjunctivae normal.  Cardiovascular:     Rate and Rhythm: Normal rate and regular rhythm.     Heart sounds: No murmur heard. Pulmonary:     Effort: Pulmonary effort is normal. No respiratory distress.     Breath sounds: Normal breath sounds.  Abdominal:     Palpations: Abdomen is soft.     Tenderness: There is no abdominal tenderness.  Musculoskeletal:        General: Normal range of motion.     Cervical back: Neck  supple.  Skin:    General: Skin is warm and dry.  Neurological:     Mental Status: She is alert and oriented to person, place, and time.  Psychiatric:        Mood and Affect: Mood normal.        Behavior: Behavior normal.    ED Results / Procedures / Treatments   Labs (all labs ordered are listed, but only abnormal results are displayed) Labs Reviewed  GROUP A STREP BY PCR  WET PREP, GENITAL  GC/CHLAMYDIA PROBE AMP ([place]) NOT AT [facility]    EKG None  Radiology No results found.  Procedures Procedures   Medications Ordered in ED Medications  ibuprofen (ADVIL) tablet 800 mg (800 mg Oral Given 03/01/21 0314)  amoxicillin (AMOXIL) capsule 500 mg (500 mg Oral Given 03/01/21 0314)    ED Course  I have reviewed the triage vital signs and the nursing notes.  Pertinent labs & imaging results that were available during my care of the patient were reviewed by me and considered in my medical decision making (see chart for details).    MDM Rules/Calculators/A&P                           Patient here with sore throat.  Oropharynx is consistent with tonsillitis.  Low-grade fever in the ED.  We will treat with amoxicillin.  Patient collect self swabs for vaginal discharge.  We will call and notify of positive test results. Final Clinical Impression(s) / ED Diagnoses Final diagnoses:  Tonsillitis    Rx / DC Orders ED Discharge Orders          Ordered    amoxicillin (AMOXIL) 500 MG capsule  3 times daily        03/01/21 0356             03/03/21, PA-C 03/01/21 0400    03/03/21, MD 03/01/21 [phone]

## 2021-03-02 LAB — GC/CHLAMYDIA PROBE AMP (~~LOC~~) NOT AT ARMC
Chlamydia: NEGATIVE
Comment: NEGATIVE
Comment: NORMAL
Neisseria Gonorrhea: NEGATIVE

## 2021-04-06 ENCOUNTER — Encounter (HOSPITAL_COMMUNITY): Payer: Self-pay | Admitting: Emergency Medicine

## 2021-04-06 ENCOUNTER — Emergency Department (HOSPITAL_COMMUNITY)
Admission: EM | Admit: 2021-04-06 | Discharge: 2021-04-06 | Disposition: A | Discharge status: Home / Self Care | Payer: Medicaid Other | Attending: Emergency Medicine | Admitting: Emergency Medicine

## 2021-04-06 DIAGNOSIS — X58XXXA Exposure to other specified factors, initial encounter: Secondary | ICD-10-CM | POA: Insufficient documentation

## 2021-04-06 DIAGNOSIS — S20152A Superficial foreign body of breast, left breast, initial encounter: Secondary | ICD-10-CM

## 2021-04-06 NOTE — Discharge Instructions (Signed)
Wash area with soap and water at least twice a day. Use Tylenol and ibuprofen needed for pain. Use ice help with pain and swelling. Return to the emergency room with any new, worsening, concerning symptoms
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
## 2021-04-09 ENCOUNTER — Emergency Department (HOSPITAL_COMMUNITY)
Admission: EM | Admit: 2021-04-09 | Discharge: 2021-04-10 | Disposition: A | Discharge status: Home / Self Care | Payer: Medicaid Other | Attending: Emergency Medicine | Admitting: Emergency Medicine

## 2021-04-09 ENCOUNTER — Other Ambulatory Visit: Payer: Self-pay

## 2021-04-09 ENCOUNTER — Encounter (HOSPITAL_COMMUNITY): Payer: Self-pay

## 2021-04-09 DIAGNOSIS — Z20822 Contact with and (suspected) exposure to covid-19: Secondary | ICD-10-CM | POA: Diagnosis not present

## 2021-04-09 DIAGNOSIS — R197 Diarrhea, unspecified: Secondary | ICD-10-CM | POA: Diagnosis not present

## 2021-04-09 DIAGNOSIS — F17290 Nicotine dependence, other tobacco product, uncomplicated: Secondary | ICD-10-CM | POA: Diagnosis not present

## 2021-04-09 DIAGNOSIS — R000 Tachycardia, unspecified: Secondary | ICD-10-CM | POA: Insufficient documentation

## 2021-04-09 DIAGNOSIS — N760 Acute vaginitis: Secondary | ICD-10-CM | POA: Diagnosis not present

## 2021-04-09 DIAGNOSIS — N898 Other specified noninflammatory disorders of vagina: Secondary | ICD-10-CM

## 2021-04-09 DIAGNOSIS — R112 Nausea with vomiting, unspecified: Secondary | ICD-10-CM | POA: Diagnosis present

## 2021-04-09 DIAGNOSIS — B9689 Other specified bacterial agents as the cause of diseases classified elsewhere: Secondary | ICD-10-CM

## 2021-04-09 MED ORDER — ONDANSETRON 4 MG PO TBDP
4.0000 mg | ORAL_TABLET | Freq: Once | ORAL | Status: AC
  Administered 2021-04-09: 4 mg via ORAL
  Filled 2021-04-09: qty 1

## 2021-04-09 MED ORDER — ACETAMINOPHEN 500 MG PO TABS
1000.0000 mg | ORAL_TABLET | Freq: Once | ORAL | Status: AC
  Administered 2021-04-09: 1000 mg via ORAL
  Filled 2021-04-09: qty 2

## 2021-04-09 NOTE — ED Triage Notes (Signed)
Pt states that she has had diarrhea and vomiting since today. Pt reports foul odor and discharge from vagina x 1 month.

## 2021-04-10 ENCOUNTER — Encounter (HOSPITAL_COMMUNITY): Payer: Self-pay | Admitting: Emergency Medicine

## 2021-04-10 LAB — WET PREP, GENITAL
Sperm: NONE SEEN
Trich, Wet Prep: NONE SEEN
WBC, Wet Prep HPF POC: <10 (ref <10)
Yeast Wet Prep HPF POC: NONE SEEN

## 2021-04-10 LAB — PREGNANCY, URINE: Preg Test, Ur: NEGATIVE

## 2021-04-10 LAB — RESP PANEL BY RT-PCR (FLU A&B, COVID) ARPGX2
Influenza A by PCR: NEGATIVE
Influenza B by PCR: NEGATIVE
SARS Coronavirus 2 by RT PCR: NEGATIVE

## 2021-04-10 LAB — HIV ANTIBODY (ROUTINE TESTING W REFLEX): HIV Screen 4th Generation wRfx: NONREACTIVE

## 2021-04-10 LAB — RPR: RPR Ser Ql: NONREACTIVE

## 2021-04-10 MED ORDER — ONDANSETRON HCL 4 MG PO TABS: 4.0000 mg | ORAL_TABLET | Freq: Three times a day (TID) | ORAL | 0 refills | Status: DC | PRN

## 2021-04-10 MED ORDER — METRONIDAZOLE 500 MG PO TABS: 500.0000 mg | ORAL_TABLET | Freq: Two times a day (BID) | ORAL | 0 refills | Status: DC

## 2021-04-10 NOTE — Discharge Instructions (Addendum)
1. Medications: zofran, metronidazole, usual home medications 2. Treatment: rest, drink plenty of fluids, advance diet slowly 3. Follow Up: Please followup with your primary doctor in 2 days for discussion of your diagnoses and further evaluation after today's visit; if you do not have a primary care doctor use the resource guide provided to find one; Please return to the ER for persistent vomiting, high fevers or worsening symptoms

## 2021-04-10 NOTE — ED Notes (Signed)
Please do not charge pt for swab. Wrong swab was collected.

## 2021-04-10 NOTE — ED Notes (Signed)
Pt had water tolerated well.
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
## 2021-06-28 ENCOUNTER — Encounter (HOSPITAL_COMMUNITY): Payer: Self-pay

## 2021-06-28 ENCOUNTER — Emergency Department (HOSPITAL_COMMUNITY)
Admission: EM | Admit: 2021-06-28 | Discharge: 2021-06-28 | Disposition: A | Discharge status: Home / Self Care | Payer: Medicaid Other | Attending: Emergency Medicine | Admitting: Emergency Medicine

## 2021-06-28 DIAGNOSIS — R059 Cough, unspecified: Secondary | ICD-10-CM | POA: Diagnosis present

## 2021-06-28 DIAGNOSIS — J069 Acute upper respiratory infection, unspecified: Secondary | ICD-10-CM | POA: Diagnosis not present
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
## 2021-06-28 NOTE — ED Provider Notes (Signed)
[facility] DEPT Provider Note   CSN: [id number] Arrival date & time: 06/28/21  1115     History  Chief Complaint  Patient presents with   Nasal Congestion   Cough    Heidi Estes is a 26 y.o. female.  26 year old female with prior medical history as detailed below presents for evaluation.  Patient complains of URI symptoms.  Patient reports multiple members in her household have the same symptoms.  She request a note for work.  She denies fever.  She denies significant shortness of breath.  She declines COVID or flu testing at this time.  The history is provided by the patient and medical records.  Illness Location:  URI symptom Severity:  Mild Onset quality:  Gradual Duration:  3 days Timing:  Constant Progression:  Waxing and waning Chronicity:  New     Home Medications Prior to Admission medications   Medication Sig Start Date End Date Taking? Authorizing Provider  metroNIDAZOLE (FLAGYL) 500 MG tablet Take 1 tablet (500 mg total) by mouth 2 (two) times daily. 04/10/21   Muthersbaugh, Jarrett Soho, PA-C  ondansetron (ZOFRAN) 4 MG tablet Take 1 tablet (4 mg total) by mouth every 8 (eight) hours as needed for nausea or vomiting. 04/10/21   Muthersbaugh, Jarrett Soho, PA-C      Allergies    Macrobid [nitrofurantoin monohyd macro] and Other    Review of Systems   Review of Systems  All other systems reviewed and are negative.  Physical Exam Updated Vital Signs BP (!) 143/99 (BP Location: Right Arm)    Pulse 92    Temp 98.1 F (36.7 C) (Oral)    Resp 18    LMP 06/18/2021 (Approximate)    SpO2 98%  Physical Exam Vitals and nursing note reviewed.  Constitutional:      General: She is not in acute distress.    Appearance: Normal appearance. She is well-developed.  HENT:     Head: Normocephalic and atraumatic.  Eyes:     Conjunctiva/sclera: Conjunctivae normal.     Pupils: Pupils are equal, round, and reactive to light.  Cardiovascular:      Rate and Rhythm: Normal rate and regular rhythm.     Heart sounds: Normal heart sounds.  Pulmonary:     Effort: Pulmonary effort is normal. No respiratory distress.     Breath sounds: Normal breath sounds.  Abdominal:     General: There is no distension.     Palpations: Abdomen is soft.     Tenderness: There is no abdominal tenderness.  Musculoskeletal:        General: No deformity. Normal range of motion.     Cervical back: Normal range of motion and neck supple.  Skin:    General: Skin is warm and dry.  Neurological:     General: No focal deficit present.     Mental Status: She is alert and oriented to person, place, and time.    ED Results / Procedures / Treatments   Labs (all labs ordered are listed, but only abnormal results are displayed) Labs Reviewed - No data to display  EKG None  Radiology No results found.  Procedures Procedures    Medications Ordered in ED Medications - No data to display  ED Course/ Medical Decision Making/ A&P                           Medical Decision Making   Medical Screen Complete  This patient presented to the ED with complaint of URI.  This complaint involves an extensive number of treatment options. The initial differential diagnosis includes, but is not limited to, viral URI  This presentation is: Acute and Self-Limited   Patient presents with complaint of URI symptoms.  Patient is otherwise without complaint.  She is requesting a work note.  Patient is without indication for additional ED evaluation.  Importance of close follow-up is stressed.  Strict return precautions given understood.  Additional history obtained:  External records from outside sources obtained and reviewed including prior ED visits and prior Inpatient records.     Problem List / ED Course:  Viral URI   Reevaluation:  After the interventions noted above, I reevaluated the patient and found that they have:  improved   Disposition:  After consideration of the diagnostic results and the patients response to treatment, I feel that the patent would benefit from close outpatient workup.          Final Clinical Impression(s) / ED Diagnoses Final diagnoses:  Upper respiratory tract infection, unspecified type    Rx / DC Orders ED Discharge Orders     None         Valarie Merino, MD 06/28/21 1201

## 2021-06-28 NOTE — Discharge Instructions (Signed)
Return for any problem.  ?

## 2021-06-30 ENCOUNTER — Encounter (HOSPITAL_COMMUNITY): Payer: Self-pay

## 2021-06-30 ENCOUNTER — Emergency Department (HOSPITAL_COMMUNITY)
Admission: EM | Admit: 2021-06-30 | Discharge: 2021-06-30 | Disposition: A | Discharge status: Home / Self Care | Payer: Medicaid Other | Attending: Emergency Medicine | Admitting: Emergency Medicine

## 2021-06-30 DIAGNOSIS — R0981 Nasal congestion: Secondary | ICD-10-CM | POA: Diagnosis present

## 2021-06-30 DIAGNOSIS — Z20822 Contact with and (suspected) exposure to covid-19: Secondary | ICD-10-CM | POA: Diagnosis not present

## 2021-06-30 DIAGNOSIS — B3731 Acute candidiasis of vulva and vagina: Secondary | ICD-10-CM

## 2021-06-30 DIAGNOSIS — J069 Acute upper respiratory infection, unspecified: Secondary | ICD-10-CM

## 2021-06-30 DIAGNOSIS — R030 Elevated blood-pressure reading, without diagnosis of hypertension: Secondary | ICD-10-CM | POA: Diagnosis not present

## 2021-06-30 DIAGNOSIS — L309 Dermatitis, unspecified: Secondary | ICD-10-CM | POA: Diagnosis not present

## 2021-06-30 DIAGNOSIS — Z113 Encounter for screening for infections with a predominantly sexual mode of transmission: Secondary | ICD-10-CM | POA: Diagnosis not present

## 2021-06-30 LAB — RESP PANEL BY RT-PCR (FLU A&B, COVID) ARPGX2
Influenza A by PCR: NEGATIVE
Influenza B by PCR: NEGATIVE
SARS Coronavirus 2 by RT PCR: NEGATIVE

## 2021-06-30 LAB — HIV ANTIBODY (ROUTINE TESTING W REFLEX): HIV Screen 4th Generation wRfx: NONREACTIVE

## 2021-06-30 MED ORDER — HYDROCORTISONE 2.5 % EX LOTN: TOPICAL_LOTION | Freq: Two times a day (BID) | CUTANEOUS | 0 refills | Status: DC

## 2021-06-30 MED ORDER — FLUCONAZOLE 100 MG PO TABS
150.0000 mg | ORAL_TABLET | Freq: Once | ORAL | 0 refills | Status: AC
Start: 2021-06-30 — End: 2021-06-30

## 2021-06-30 MED ORDER — DOXYCYCLINE HYCLATE 100 MG PO TABS
100.0000 mg | ORAL_TABLET | Freq: Once | ORAL | Status: AC
  Administered 2021-06-30: 100 mg via ORAL
  Filled 2021-06-30: qty 1

## 2021-06-30 MED ORDER — FLUTICASONE PROPIONATE 50 MCG/ACT NA SUSP: 2.0000 | Freq: Two times a day (BID) | NASAL | 0 refills | Status: AC

## 2021-06-30 MED ORDER — FLUCONAZOLE 150 MG PO TABS
150.0000 mg | ORAL_TABLET | Freq: Once | ORAL | Status: AC
  Administered 2021-06-30: 150 mg via ORAL
  Filled 2021-06-30: qty 1

## 2021-06-30 MED ORDER — GUAIFENESIN-DM 100-10 MG/5ML PO SYRP: 10.0000 mL | ORAL_SOLUTION | ORAL | 0 refills | Status: DC | PRN

## 2021-06-30 MED ORDER — CEFTRIAXONE SODIUM 1 G IJ SOLR
500.0000 mg | Freq: Once | INTRAMUSCULAR | Status: AC
  Administered 2021-06-30: 500 mg via INTRAMUSCULAR
  Filled 2021-06-30: qty 10

## 2021-06-30 MED ORDER — DOXYCYCLINE HYCLATE 100 MG PO CAPS: 100.0000 mg | ORAL_CAPSULE | Freq: Two times a day (BID) | ORAL | 0 refills | Status: DC

## 2021-06-30 MED ORDER — LIDOCAINE HCL (PF) 1 % IJ SOLN
1.0000 mL | Freq: Once | INTRAMUSCULAR | Status: AC
  Administered 2021-06-30: 1 mL
  Filled 2021-06-30: qty 30

## 2021-06-30 NOTE — Discharge Instructions (Addendum)
Please follow your MyChart for your results on STI testing. Please take doxycyline 100 mg twice a day for 10 days if positive for chlamydia. You were already treated prophylactically for gonorrhea.   Please take fluconazole prescription, one pill/dose, if yeast symptoms do not improve in the next 5-7 days.   Nose spray and cough syrup was sent to your pharmacy as well.

## 2021-06-30 NOTE — ED Triage Notes (Signed)
Pt arrived via POV, states concern for STD.

## 2021-06-30 NOTE — ED Provider Triage Note (Signed)
Emergency Medicine Provider Triage Evaluation Note  Heidi Estes , a 26 y.o. female  was evaluated in triage.  Pt complains of new sexual partner, concern for STI, requests HIV, RPR. Also endorses high blood pressure the last few times she's been to the doctor. Reports recent cold, wonders whether it's related. No chest pain, shortness of breath. Endorses occasional headache. Does reports some vaginal discharge but reports improved with vinegar soak she has been using.  Review of Systems  Positive: STI screening, HTN Negative: Shortness of breath, chest pain  Physical Exam  BP (!) 150/101 (BP Location: Left Arm)    Pulse 87    Temp 98.4 F (36.9 C) (Oral)    Resp 18    LMP 06/18/2021 (Approximate)    SpO2 100%  Gen:   Awake, no distress   Resp:  Normal effort  MSK:   Moves extremities without difficulty  Other:    Medical Decision Making  Medically screening exam initiated at 2:59 PM.  Appropriate orders placed.  CARINNA NEWHART was informed that the remainder of the evaluation will be completed by another provider, this initial triage assessment does not replace that evaluation, and the importance of remaining in the ED until their evaluation is complete.  Workup inititated   Olene Floss, [place] 06/30/21 1501

## 2021-06-30 NOTE — ED Provider Notes (Signed)
[facility] DEPT Provider Note   CSN: [id number] Arrival date & time: 06/30/21  1416     History  Chief Complaint  Patient presents with   Exposure to STD    Heidi Estes is a 26 y.o. female.  Pt is a 26 yo female with pmh of eczema and herpes presenting for three concerns: skin rash, cold symptoms, elevated bp, and vaginal discharge.   Patient admits to dry itching skin on chest, back, upper ext, and lower ext x years. States sometimes she has small pumps after itching. Admits to improvement after [employer] use.  Patient admits to nasal congestion, sore throat, and cough x 2 weeks. States symptoms originally resolved but then her mother got sick and now she feels sick again. Denies any fevers. Denies any antibiotic use.   Patient admits to thick vaginal discharge. Hx of STI's and yeast infections. Admit to vaginal pruritis. Denies vaginal lesions. Denies dysuria.   Pt admits to elevated blood pressure reading in ER today. BP 150/101 on initial eval. Repeats all under 130/. Never dx with hypertension in the past. Denies headache, blurred vision, or hearing changes.   The history is provided by the patient. No language interpreter was used.  Exposure to STD Pertinent negatives include no chest pain, no abdominal pain and no shortness of breath.      Home Medications Prior to Admission medications   Medication Sig Start Date End Date Taking? Authorizing Provider  metroNIDAZOLE (FLAGYL) 500 MG tablet Take 1 tablet (500 mg total) by mouth 2 (two) times daily. 04/10/21   Muthersbaugh, Jarrett Soho, PA-C  ondansetron (ZOFRAN) 4 MG tablet Take 1 tablet (4 mg total) by mouth every 8 (eight) hours as needed for nausea or vomiting. 04/10/21   Muthersbaugh, Jarrett Soho, PA-C      Allergies    Macrobid [nitrofurantoin monohyd macro] and Other    Review of Systems   Review of Systems  Constitutional:  Negative for chills and fever.  HENT:  Positive for dental  problem and sore throat. Negative for ear pain.   Eyes:  Negative for pain and visual disturbance.  Respiratory:  Positive for cough. Negative for shortness of breath.   Cardiovascular:  Negative for chest pain and palpitations.  Gastrointestinal:  Negative for abdominal pain and vomiting.  Genitourinary:  Positive for vaginal discharge. Negative for dysuria and hematuria.  Musculoskeletal:  Negative for arthralgias and back pain.  Skin:  Positive for rash. Negative for color change.  Neurological:  Negative for seizures and syncope.  All other systems reviewed and are negative.  Physical Exam Updated Vital Signs BP (!) 150/101 (BP Location: Left Arm)    Pulse 87    Temp 98.4 F (36.9 C) (Oral)    Resp 18    LMP 06/18/2021 (Approximate)    SpO2 100%  Physical Exam Vitals and nursing note reviewed.  Constitutional:      General: She is not in acute distress.    Appearance: She is well-developed.  HENT:     Head: Normocephalic and atraumatic.  Eyes:     Conjunctiva/sclera: Conjunctivae normal.  Cardiovascular:     Rate and Rhythm: Normal rate and regular rhythm.     Heart sounds: No murmur heard. Pulmonary:     Effort: Pulmonary effort is normal. No respiratory distress.     Breath sounds: Normal breath sounds.  Abdominal:     Palpations: Abdomen is soft.     Tenderness: There is no abdominal tenderness.  Musculoskeletal:        General: No swelling.     Cervical back: Neck supple.  Skin:    General: Skin is warm and dry.     Capillary Refill: Capillary refill takes less than 2 seconds.     Comments: Dry rash on chest, UE, and LE. Excoriations present.   Neurological:     Mental Status: She is alert.  Psychiatric:        Mood and Affect: Mood normal.    ED Results / Procedures / Treatments   Labs (all labs ordered are listed, but only abnormal results are displayed) Labs Reviewed  RESP PANEL BY RT-PCR (FLU A&B, COVID) ARPGX2  HIV ANTIBODY (ROUTINE TESTING W REFLEX)   RPR  URINALYSIS, ROUTINE W REFLEX MICROSCOPIC  GC/CHLAMYDIA PROBE AMP ([place]) NOT AT [facility]    EKG None  Radiology No results found.  Procedures .Critical Care Performed by: Lianne Cure, DO Authorized by: Lianne Cure, DO   Critical care provider statement:    Critical care time (minutes):  31   Critical care was time spent personally by me on the following activities:  Development of treatment plan with patient or surrogate, discussions with consultants, evaluation of patient's response to treatment, examination of patient, ordering and review of laboratory studies, ordering and review of radiographic studies, ordering and performing treatments and interventions, pulse oximetry, re-evaluation of patient's condition and review of old charts Comments:     Presentation to ED for multiple complaints with 5 diagnosis and medications provided and teaching for all diagnoses.     Medications Ordered in ED Medications - No data to display  ED Course/ Medical Decision Making/ A&P                           Medical Decision Making Amount and/or Complexity of Data Reviewed Labs: ordered.  Risk OTC drugs. Prescription drug management.   4:47 PM Pt is a 26 yo female with pmh of eczema and herpes presenting for three concerns: skin rash, cold symptoms, elevated bp, and vaginal discharge.   Rash Patient admits to dry itching skin on chest, back, upper ext, and lower ext x several years. States sometimes she has small pumps after itching. Admits to improvement after [employer] use. Pt has eczema on exam with evidence of excoriations. Prescription for hydrocortisone given and recommendations to discontinues use of any highly scented body washes/detergents/lotions. Recommendations to use lotions targeted toward eczema.   URI with cough Patient admits to nasal congestion, sore throat, and cough x 2 weeks. States symptoms originally resolved but then her mother got sick and now  she feels sick again. Denies any fevers. Denies any antibiotic use. Covid and influneza negative. Pt is afebrile, extremely well appearing, with no s/s sepsis. Pt well appearing. Hx of resolution of symptoms then return after mother got sick. Likely viral URI back to back. Robitussin and nasal spray prescriptions sent to pharmacy.   STI check Patient admits to thick vaginal discharge. Hx of STI's and yeast infections. Admit to vaginal pruritis. Denies vaginal lesions. Denies dysuria. Declined vaginal exam. Requested self swab. Request G/C prophylaxis. Rocephin and doxy given in ED with prescription for doxy sent to pharmacy incase positive. Pt encouraged to check MyChart for results in 24-48 hours. Pt also requesting to leave prior to wet mount results. Fluconazole prescribed due to hx of vaginal candiasis and history describing similar symptoms.   Elevated blood pressure reading  Pt admits to elevated blood pressure reading in ER today. BP 150/101 on initial eval. Repeats all under 130/. Never dx with hypertension in the past. Denies headache, blurred vision, or hearing changes. Recommended for close f/u with pcp for recheck and management if elevated at that time. Pt does not have pcp. Pcp provided.   Patient in no distress and overall condition improved here in the ED. Detailed discussions were had with the patient regarding current findings, and need for close f/u with PCP or on call doctor. The patient has been instructed to return immediately if the symptoms worsen in any way for re-evaluation. Patient verbalized understanding and is in agreement with current care plan. All questions answered prior to discharge.          Final Clinical Impression(s) / ED Diagnoses Final diagnoses:  Screen for STD (sexually transmitted disease)  Vaginal candidiasis  Viral URI with cough  Elevated blood pressure reading  Eczema, unspecified type    Rx / DC Orders ED Discharge Orders     None          Lianne Cure, DO [id number]

## 2021-07-01 LAB — RPR: RPR Ser Ql: NONREACTIVE

## 2021-07-02 LAB — GC/CHLAMYDIA PROBE AMP (~~LOC~~) NOT AT ARMC
Chlamydia: POSITIVE — AB
Comment: NEGATIVE
Comment: NORMAL
Neisseria Gonorrhea: NEGATIVE

## 2021-07-14 ENCOUNTER — Other Ambulatory Visit: Payer: Self-pay

## 2021-07-14 ENCOUNTER — Encounter (HOSPITAL_COMMUNITY): Payer: Self-pay | Admitting: *Deleted

## 2021-07-14 ENCOUNTER — Emergency Department (HOSPITAL_COMMUNITY): Payer: Medicaid Other

## 2021-07-14 ENCOUNTER — Emergency Department (HOSPITAL_COMMUNITY)
Admission: EM | Admit: 2021-07-14 | Discharge: 2021-07-14 | Disposition: A | Discharge status: Home / Self Care | Payer: Medicaid Other | Attending: Emergency Medicine | Admitting: Emergency Medicine

## 2021-07-14 DIAGNOSIS — R0781 Pleurodynia: Secondary | ICD-10-CM | POA: Diagnosis not present

## 2021-07-14 DIAGNOSIS — R059 Cough, unspecified: Secondary | ICD-10-CM | POA: Insufficient documentation

## 2021-07-14 DIAGNOSIS — J029 Acute pharyngitis, unspecified: Secondary | ICD-10-CM | POA: Insufficient documentation

## 2021-07-14 DIAGNOSIS — M546 Pain in thoracic spine: Secondary | ICD-10-CM | POA: Insufficient documentation

## 2021-07-14 DIAGNOSIS — M25511 Pain in right shoulder: Secondary | ICD-10-CM | POA: Insufficient documentation

## 2021-07-14 LAB — POC URINE PREG, ED: Preg Test, Ur: NEGATIVE

## 2021-07-14 NOTE — ED Triage Notes (Signed)
Pt states pain in the right upper back "for months" worse today. Denies injury.

## 2021-07-14 NOTE — Discharge Instructions (Addendum)
It was a pleasure taking care of you!   Your x-ray was negative for fracture or dislocation.  You may take over the counter 600 mg Ibuprofen every 6 hours or 500 mg Tylenol every 6 hours as needed for pain for no more than 7 days. You may apply ice to affected area for up to 15 minutes at a time. Ensure to place a barrier between your skin and the ice.  You may follow-up with your primary care provider as needed.  Return to the Emergency Department if you are experiencing increasing/worsening pain, swelling, color change, fever, or worsening symptoms. 

## 2021-07-14 NOTE — ED Provider Notes (Signed)
[facility] DEPT Provider Note   CSN: [id number] Arrival date & time: 07/14/21  2116     History  Chief Complaint  Patient presents with   Back Pain    Heidi Estes is a 26 y.o. female who presents to the emergency department complaining of right upper back pain/shoulder pain onset several months.  Notes that her pain acutely worsened today.  Denies injury or trauma.  Has associated right lateral rib pain.  Has had sick contacts however notes that she is getting over a viral URI that she was diagnosed with.  Has associated cough, sore throat.  Has tried Advil with relief for her symptoms.  Denies chest pain, shortness of breath, fever, chills, nausea, vomiting.   The history is provided by the patient. No language interpreter was used.      Home Medications Prior to Admission medications   Medication Sig Start Date End Date Taking? Authorizing Provider  doxycycline (VIBRAMYCIN) 100 MG capsule Take 1 capsule (100 mg total) by mouth 2 (two) times daily. [id number]   Campbell Stall P, DO  fluticasone (FLONASE) 50 MCG/ACT nasal spray Place 2 sprays into both nostrils in the morning and at bedtime for 15 days. 06/30/21 [id number]  Campbell Stall P, DO  guaiFENesin-dextromethorphan (ROBITUSSIN DM) 100-10 MG/5ML syrup Take 10 mLs by mouth every 4 (four) hours as needed for cough. [id number]   Campbell Stall P, DO  hydrocortisone 2.5 % lotion Apply topically 2 (two) times daily. Apply to skin where areas of irrigation occur. Do not apply to face. [id number]   Campbell Stall P, DO  metroNIDAZOLE (FLAGYL) 500 MG tablet Take 1 tablet (500 mg total) by mouth 2 (two) times daily. 04/10/21   Muthersbaugh, Jarrett Soho, PA-C  ondansetron (ZOFRAN) 4 MG tablet Take 1 tablet (4 mg total) by mouth every 8 (eight) hours as needed for nausea or vomiting. 04/10/21   Muthersbaugh, Jarrett Soho, PA-C      Allergies    Macrobid [nitrofurantoin monohyd macro] and Other    Review of Systems   Review of  Systems  Constitutional:  Negative for chills and fever.  Respiratory:  Negative for shortness of breath.   Cardiovascular:  Negative for chest pain.  Gastrointestinal:  Negative for nausea and vomiting.  Musculoskeletal:  Positive for arthralgias and back pain. Negative for joint swelling.  Skin:  Negative for color change and wound.  All other systems reviewed and are negative.  Physical Exam Updated Vital Signs BP 130/90    Pulse 80    Temp 98 F (36.7 C)    Resp 17    LMP 07/09/2021 (Approximate)    SpO2 99%  Physical Exam Vitals and nursing note reviewed.  Constitutional:      General: She is not in acute distress.    Appearance: Normal appearance.  Eyes:     General: No scleral icterus.    Extraocular Movements: Extraocular movements intact.  Cardiovascular:     Rate and Rhythm: Normal rate.  Pulmonary:     Effort: Pulmonary effort is normal. No respiratory distress.  Chest:     Chest wall: Tenderness present. No lacerations, deformity or swelling.     Comments: Tenderness to palpation noted to right lateral ribs without obvious deformity, crepitus, step-offs.  No overlying skin changes. Musculoskeletal:     Cervical back: Neck supple.     Comments: No tenderness to palpation to right shoulder/trapezius. No obvious deformity, effusion, erythema, or swelling.  Full active range of motion of bilateral  upper and lower extremities. Patient able to ambulate without difficulty or assistance. No antalgic gait.  No tenderness to palpation to C, T, L, S spine. Strength and sensation intact to bilateral upper and lower extremities.   Skin:    General: Skin is warm and dry.     Findings: No bruising, erythema or rash.  Neurological:     Mental Status: She is alert.  Psychiatric:        Behavior: Behavior normal.    ED Results / Procedures / Treatments   Labs (all labs ordered are listed, but only abnormal results are displayed) Labs Reviewed  POC URINE PREG, ED     EKG None  Radiology DG Ribs Unilateral W/Chest Right  Result Date: 07/14/2021 CLINICAL DATA:  Right upper back pain EXAM: RIGHT RIBS AND CHEST - 3+ VIEW COMPARISON:  04/16/2016 FINDINGS: No fracture or other bone lesions are seen involving the ribs. There is no evidence of pneumothorax or pleural effusion. Both lungs are clear. Heart size and mediastinal contours are within normal limits. IMPRESSION: Negative. Electronically Signed   By: Rolm Baptise M.D.   On: 07/14/2021 22:22   DG Shoulder Right  Result Date: 07/14/2021 CLINICAL DATA:  Right upper back pain EXAM: RIGHT SHOULDER - 2+ VIEW COMPARISON:  None. FINDINGS: There is no evidence of fracture or dislocation. There is no evidence of arthropathy or other focal bone abnormality. Soft tissues are unremarkable. IMPRESSION: Negative. Electronically Signed   By: Rolm Baptise M.D.   On: 07/14/2021 22:22    Procedures Procedures    Medications Ordered in ED Medications - No data to display  ED Course/ Medical Decision Making/ A&P Clinical Course as of 07/15/21 1502  Sat Jul 14, 2021  2258 Discussed discharge treatment plan with the patient at bedside.  Patient agreeable at this time.  Patient appears safe for discharge. [SB]    Clinical Course User Index [SB] Berley Gambrell A, PA-C                           Medical Decision Making Amount and/or Complexity of Data Reviewed Radiology: ordered.   Patient with right upper back pain/shoulder pain and right lateral rib pain onset several months acutely worsening today.  Tried Advil with relief for her symptoms.  Vital signs stable.  On exam patient without tenderness to palpation noted to spine, right trapezius, right shoulder.  Full active range of motion of bilateral upper and lower extremities.  Strength and sensation intact to bilateral upper and lower extremities.  Mild tenderness to palpation noted to right lateral ribs without overlying skin changes or obvious deformity.  No  acute cardiovascular, respiratory, abdominal exam findings.  Differential diagnosis includes fracture, dislocation, contusion, strain.   Labs:  I ordered, and personally interpreted labs.  The pertinent results include: Negative pregnancy urine  Imaging: I ordered imaging studies including right shoulder x-ray and right ribs unilateral with chest x-ray I independently visualized and interpreted imaging which showed, both imaging studies negative for acute fracture or dislocation. I agree with the radiologist interpretation   Disposition: Patient presentation suspicious for strain.  Doubt fracture, dislocation, contusion at this time.. After consideration of the diagnostic results and the patients response to treatment, I feel that the patient would benefit from discharge home. Supportive care measures and strict return precautions discussed with patient at bedside. Pt acknowledges and verbalizes understanding. Pt appears safe for discharge. Follow up as indicated in discharge paperwork.  This chart was dictated using voice recognition software, Dragon. Despite the best efforts of this provider to proofread and correct errors, errors may still occur which can change documentation meaning.  Final Clinical Impression(s) / ED Diagnoses Final diagnoses:  Right shoulder pain    Rx / DC Orders ED Discharge Orders     None         Clarece Drzewiecki A, PA-C 07/15/21 1502    Tegeler, Gwenyth Allegra, MD 07/15/21 2043

## 2021-10-08 ENCOUNTER — Emergency Department (HOSPITAL_COMMUNITY)
Admission: EM | Admit: 2021-10-08 | Discharge: 2021-10-08 | Disposition: A | Discharge status: Home / Self Care | Payer: Medicaid Other | Attending: Emergency Medicine | Admitting: Emergency Medicine

## 2021-10-08 ENCOUNTER — Emergency Department (HOSPITAL_COMMUNITY): Payer: Medicaid Other

## 2021-10-08 ENCOUNTER — Encounter (HOSPITAL_COMMUNITY): Payer: Self-pay

## 2021-10-08 DIAGNOSIS — R059 Cough, unspecified: Secondary | ICD-10-CM | POA: Diagnosis not present

## 2021-10-08 DIAGNOSIS — J029 Acute pharyngitis, unspecified: Secondary | ICD-10-CM | POA: Diagnosis present

## 2021-10-08 LAB — GROUP A STREP BY PCR: Group A Strep by PCR: NOT DETECTED

## 2021-10-08 MED ORDER — OMEPRAZOLE 20 MG PO CPDR: 20.0000 mg | DELAYED_RELEASE_CAPSULE | Freq: Every day | ORAL | 0 refills | Status: DC

## 2021-10-08 NOTE — Discharge Instructions (Signed)
You were seen in the emergency department for sore throat.  Your strep test was negative and your chest x-ray was clear.  We are starting you on some acid medication.  Please follow-up with your primary care doctor.  Return to the emergency department any worsening or concerning symptoms.

## 2021-10-08 NOTE — ED Triage Notes (Signed)
Pt arrived via POV, c/o sore throat, and coughing up mucus.

## 2021-10-08 NOTE — ED Provider Notes (Signed)
[facility]-EMERGENCY DEPT Provider Note   CSN: [id number] Arrival date & time: 10/08/21  1218     History  Chief Complaint  Patient presents with   Sore Throat    Heidi Estes is a 26 y.o. female.  She has no significant past medical history.  Complaining of sore dry throat and sticky mucus in throat that is been going on for months.  She is not sure if she has had a fever.  She has had a little bit of a cough.  She has noticed some reflux symptoms.  The history is provided by the patient.  Sore Throat This is a new problem. Episode onset: 2 months. The problem occurs daily. The problem has not changed since onset.Pertinent negatives include no chest pain, no abdominal pain, no headaches and no shortness of breath. The symptoms are aggravated by swallowing. Nothing relieves the symptoms. She has tried nothing for the symptoms. The treatment provided no relief.      Home Medications Prior to Admission medications   Medication Sig Start Date End Date Taking? Authorizing Provider  doxycycline (VIBRAMYCIN) 100 MG capsule Take 1 capsule (100 mg total) by mouth 2 (two) times daily. 06/30/21   Edwin Dada P, DO  fluticasone (FLONASE) 50 MCG/ACT nasal spray Place 2 sprays into both nostrils in the morning and at bedtime for 15 days. 06/30/21 07/15/21  Edwin Dada P, DO  guaiFENesin-dextromethorphan (ROBITUSSIN DM) 100-10 MG/5ML syrup Take 10 mLs by mouth every 4 (four) hours as needed for cough. 06/30/21   Edwin Dada P, DO  hydrocortisone 2.5 % lotion Apply topically 2 (two) times daily. Apply to skin where areas of irrigation occur. Do not apply to face. 06/30/21   Edwin Dada P, DO  metroNIDAZOLE (FLAGYL) 500 MG tablet Take 1 tablet (500 mg total) by mouth 2 (two) times daily. 04/10/21   Muthersbaugh, Dahlia Client, PA-C  ondansetron (ZOFRAN) 4 MG tablet Take 1 tablet (4 mg total) by mouth every 8 (eight) hours as needed for nausea or vomiting. 04/10/21   Muthersbaugh,  Dahlia Client, PA-C      Allergies    Macrobid [nitrofurantoin monohyd macro] and Other    Review of Systems   Review of Systems  Constitutional:  Negative for fever.  HENT:  Positive for sore throat.   Eyes:  Negative for visual disturbance.  Respiratory:  Negative for shortness of breath.   Cardiovascular:  Negative for chest pain.  Gastrointestinal:  Negative for abdominal pain.  Neurological:  Negative for headaches.   Physical Exam Updated Vital Signs BP (!) 145/95   Pulse 86   Temp 98.3 F (36.8 C) (Oral)   Resp 18   Ht 5\' 2"  (1.575 m)   Wt 83.9 kg   SpO2 99%   BMI 33.84 kg/m  Physical Exam Vitals and nursing note reviewed.  Constitutional:      General: She is not in acute distress.    Appearance: She is well-developed.  HENT:     Head: Normocephalic and atraumatic.     Mouth/Throat:     Mouth: Mucous membranes are moist.     Pharynx: Oropharynx is clear. Uvula midline. Posterior oropharyngeal erythema present. No oropharyngeal exudate.     Tonsils: No tonsillar exudate or tonsillar abscesses.  Eyes:     Conjunctiva/sclera: Conjunctivae normal.  Neck:     Thyroid: No thyromegaly.  Cardiovascular:     Rate and Rhythm: Normal rate and regular rhythm.     Heart sounds: No murmur  heard. Pulmonary:     Effort: Pulmonary effort is normal. No respiratory distress.     Breath sounds: Normal breath sounds.  Abdominal:     Palpations: Abdomen is soft.     Tenderness: There is no abdominal tenderness.  Musculoskeletal:        General: No swelling.     Cervical back: Neck supple.  Skin:    General: Skin is warm and dry.     Capillary Refill: Capillary refill takes less than 2 seconds.  Neurological:     General: No focal deficit present.     Mental Status: She is alert.    ED Results / Procedures / Treatments   Labs (all labs ordered are listed, but only abnormal results are displayed) Labs Reviewed  GROUP A STREP BY PCR    EKG None  Radiology DG Chest 2  View  Result Date: 10/08/2021 CLINICAL DATA:  Cough EXAM: CHEST - 2 VIEW COMPARISON:  07/14/2021 FINDINGS: The heart size and mediastinal contours are within normal limits. Both lungs are clear. The visualized skeletal structures are unremarkable. IMPRESSION: No active cardiopulmonary disease. Electronically Signed   By: Elige Ko M.D.   On: 10/08/2021 14:28    Procedures Procedures    Medications Ordered in ED Medications - No data to display  ED Course/ Medical Decision Making/ A&P Clinical Course as of 10/08/21 1911  Mon Oct 08, 2021  1432 Chest x-ray interpreted by me as no acute infiltrates.  Awaiting radiology reading. [MB]    Clinical Course User Index [MB] Terrilee Files, MD                           Medical Decision Making Amount and/or Complexity of Data Reviewed Radiology: ordered.  Risk Prescription drug management.  Heidi Estes was evaluated in Emergency Department on 10/08/2021 for the symptoms described in the history of present illness. She was evaluated in the context of the global COVID-19 pandemic, which necessitated consideration that the patient might be at risk for infection with the SARS-CoV-2 virus that causes COVID-19. Institutional protocols and algorithms that pertain to the evaluation of patients at risk for COVID-19 are in a state of rapid change based on information released by regulatory bodies including the CDC and federal and state organizations. These policies and algorithms were followed during the patient's care in the ED. Parental diagnosis includes strep throat, pharyngitis, reflux, COVID, flu.  Patient declines COVID testing.  Strep test negative.  Chest x-ray does not show any acute infiltrates.  Otherwise benign exam.  Will treat with PPI.  Return instructions discussed         Final Clinical Impression(s) / ED Diagnoses Final diagnoses:  Sore throat    Rx / DC Orders ED Discharge Orders          Ordered    omeprazole  (PRILOSEC) 20 MG capsule  Daily        10/08/21 1436              Terrilee Files, MD 10/08/21 1912

## 2022-01-12 ENCOUNTER — Other Ambulatory Visit: Payer: Self-pay

## 2022-01-12 ENCOUNTER — Emergency Department (HOSPITAL_COMMUNITY)
Admission: EM | Admit: 2022-01-12 | Discharge: 2022-01-12 | Disposition: A | Discharge status: Home / Self Care | Payer: Medicaid Other | Attending: Emergency Medicine | Admitting: Emergency Medicine

## 2022-01-12 DIAGNOSIS — N898 Other specified noninflammatory disorders of vagina: Secondary | ICD-10-CM

## 2022-01-12 LAB — URINALYSIS, ROUTINE W REFLEX MICROSCOPIC
Bilirubin Urine: NEGATIVE
Glucose, UA: NEGATIVE mg/dL
Hgb urine dipstick: NEGATIVE
Ketones, ur: NEGATIVE mg/dL
Nitrite: NEGATIVE
Protein, ur: NEGATIVE mg/dL
Specific Gravity, Urine: 1.028 (ref 1.005–1.030)
pH: 5 (ref 5.0–8.0)

## 2022-01-12 LAB — WET PREP, GENITAL
Clue Cells Wet Prep HPF POC: NONE SEEN
Sperm: NONE SEEN
Trich, Wet Prep: NONE SEEN
WBC, Wet Prep HPF POC: <10 (ref <10)
Yeast Wet Prep HPF POC: NONE SEEN

## 2022-01-12 LAB — PREGNANCY, URINE: Preg Test, Ur: NEGATIVE

## 2022-01-12 NOTE — ED Triage Notes (Signed)
Pt arrives with request of blood work to be done to check for STD's.

## 2022-01-12 NOTE — ED Provider Notes (Signed)
[facility]-EMERGENCY DEPT Provider Note   CSN: [id number] Arrival date & time: 01/12/22  1755     History  Chief Complaint  Patient presents with   STD Check     Heidi Estes is a 26 y.o. female with no significant past medical history who presents to the ED due to vaginal irritation.  Patient states she has had vaginal irritation for numerous months.  She also endorses some increased vaginal discharge.  Denies abdominal pain.  No fever or chills.  No urinary symptoms.  Patient is requesting STI testing.   History obtained from patient and past medical records. No interpreter used during encounter.       Home Medications Prior to Admission medications   Medication Sig Start Date End Date Taking? Authorizing Provider  doxycycline (VIBRAMYCIN) 100 MG capsule Take 1 capsule (100 mg total) by mouth 2 (two) times daily. 06/30/21   Edwin Dada P, DO  fluticasone (FLONASE) 50 MCG/ACT nasal spray Place 2 sprays into both nostrils in the morning and at bedtime for 15 days. 06/30/21 07/15/21  Edwin Dada P, DO  guaiFENesin-dextromethorphan (ROBITUSSIN DM) 100-10 MG/5ML syrup Take 10 mLs by mouth every 4 (four) hours as needed for cough. 06/30/21   Edwin Dada P, DO  hydrocortisone 2.5 % lotion Apply topically 2 (two) times daily. Apply to skin where areas of irrigation occur. Do not apply to face. 06/30/21   Edwin Dada P, DO  metroNIDAZOLE (FLAGYL) 500 MG tablet Take 1 tablet (500 mg total) by mouth 2 (two) times daily. 04/10/21   Muthersbaugh, Dahlia Client, PA-C  omeprazole (PRILOSEC) 20 MG capsule Take 1 capsule (20 mg total) by mouth daily. 10/08/21   Terrilee Files, MD  ondansetron (ZOFRAN) 4 MG tablet Take 1 tablet (4 mg total) by mouth every 8 (eight) hours as needed for nausea or vomiting. 04/10/21   Muthersbaugh, Dahlia Client, PA-C      Allergies    Macrobid [nitrofurantoin monohyd macro] and Other    Review of Systems   Review of Systems  Constitutional:  Negative  for chills and fever.  Gastrointestinal:  Negative for abdominal pain.  Genitourinary:  Positive for vaginal discharge and vaginal pain. Negative for dysuria and genital sores.  All other systems reviewed and are negative.   Physical Exam Updated Vital Signs BP (!) 126/95 (BP Location: Right Arm)   Pulse 82   Temp 98.4 F (36.9 C) (Oral)   Resp 18   Ht 5\' 2"  (1.575 m)   Wt 86.2 kg   SpO2 100%   BMI 34.75 kg/m  Physical Exam Vitals and nursing note reviewed.  Constitutional:      General: She is not in acute distress.    Appearance: She is not ill-appearing.  HENT:     Head: Normocephalic.  Eyes:     Pupils: Pupils are equal, round, and reactive to light.  Cardiovascular:     Rate and Rhythm: Normal rate and regular rhythm.     Pulses: Normal pulses.     Heart sounds: Normal heart sounds. No murmur heard.    No friction rub. No gallop.  Pulmonary:     Effort: Pulmonary effort is normal.     Breath sounds: Normal breath sounds.  Abdominal:     General: Abdomen is flat. There is no distension.     Palpations: Abdomen is soft.     Tenderness: There is no abdominal tenderness. There is no guarding or rebound.     Comments: Abdomen  soft, nondistended, nontender to palpation in all quadrants without guarding or peritoneal signs. No rebound.   Genitourinary:    Comments: Patient declined pelvic exam and wanted to  self swab Musculoskeletal:        General: Normal range of motion.     Cervical back: Neck supple.  Skin:    General: Skin is warm and dry.  Neurological:     General: No focal deficit present.     Mental Status: She is alert.  Psychiatric:        Mood and Affect: Mood normal.        Behavior: Behavior normal.     ED Results / Procedures / Treatments   Labs (all labs ordered are listed, but only abnormal results are displayed) Labs Reviewed  URINALYSIS, ROUTINE W REFLEX MICROSCOPIC - Abnormal; Notable for the following components:      Result Value    APPearance HAZY (*)    Leukocytes,Ua SMALL (*)    Bacteria, UA FEW (*)    All other components within normal limits  WET PREP, GENITAL  URINE CULTURE  PREGNANCY, URINE  HIV ANTIBODY (ROUTINE TESTING W REFLEX)  RPR  GC/CHLAMYDIA PROBE AMP ([place]) NOT AT [facility]    EKG None  Radiology No results found.  Procedures Procedures    Medications Ordered in ED Medications - No data to display  ED Course/ Medical Decision Making/ A&P                           Medical Decision Making Amount and/or Complexity of Data Reviewed Labs: ordered. Decision-making details documented in ED Course.   26 year old female presents to the ED due to vaginal irritation.  Patient also endorses increased vaginal discharge.  Patient is requesting full STI testing.  No abdominal pain.  Upon arrival, stable vitals.  Patient is afebrile, not tachycardic or hypoxic.  Patient in no acute distress.  Abdomen soft, nondistended, nontender.  Low suspicion for acute abdomen.  HIV and syphilis test ordered.  Offered patient a pelvic exam for gonorrhea/chlamydia and wet prep swabs however, patient declined.  Patient notes she would prefer to self swab. Low suspicion for PID given no pain. Wet prep and gonorrhea/chlamydia cultures ordered.  UA to rule out UTI.  Wet prep negative.  Pregnancy test negative.  UA significant for small leukocytes and few bacteria.  Urine culture pending.  Given no urinary symptoms, will hold on antibiotics at this time pending urine culture results.  HIV, syphilis, gonorrhea/chlamydia results pending.  Patient made aware that she may find results on MyChart over the next few days. Strict ED precautions discussed with patient. Patient states understanding and agrees to plan. Patient discharged home in no acute distress and stable vitals        Final Clinical Impression(s) / ED Diagnoses Final diagnoses:  Vaginal irritation    Rx / DC Orders ED Discharge Orders     None          Jesusita Oka 01/12/22 2102    Gwyneth Sprout, MD 01/13/22 [id number]

## 2022-01-12 NOTE — Discharge Instructions (Addendum)
It was a pleasure taking care of you today.  As discussed, all of your results were reassuring.  Some results are still pending.  Please check on MyChart for results within the next few days.  Follow-up with PCP if symptoms do not improve over the next few days.  Return to the ER for new or worsening symptoms.

## 2022-01-13 LAB — RPR: RPR Ser Ql: NONREACTIVE

## 2022-01-13 LAB — HIV ANTIBODY (ROUTINE TESTING W REFLEX): HIV Screen 4th Generation wRfx: NONREACTIVE

## 2022-01-14 LAB — URINE CULTURE

## 2022-01-14 LAB — GC/CHLAMYDIA PROBE AMP (~~LOC~~) NOT AT ARMC
Chlamydia: NEGATIVE
Comment: NEGATIVE
Comment: NORMAL
Neisseria Gonorrhea: NEGATIVE

## 2022-04-10 ENCOUNTER — Emergency Department (HOSPITAL_COMMUNITY)
Admission: EM | Admit: 2022-04-10 | Discharge: 2022-04-10 | Disposition: A | Discharge status: Home / Self Care | Payer: Medicaid Other | Attending: Emergency Medicine | Admitting: Emergency Medicine

## 2022-04-10 ENCOUNTER — Other Ambulatory Visit: Payer: Self-pay

## 2022-04-10 DIAGNOSIS — N898 Other specified noninflammatory disorders of vagina: Secondary | ICD-10-CM | POA: Insufficient documentation

## 2022-04-10 DIAGNOSIS — R112 Nausea with vomiting, unspecified: Secondary | ICD-10-CM | POA: Insufficient documentation

## 2022-04-10 DIAGNOSIS — Z20822 Contact with and (suspected) exposure to covid-19: Secondary | ICD-10-CM | POA: Diagnosis not present

## 2022-04-10 LAB — BASIC METABOLIC PANEL
Anion gap: 6 (ref 5–15)
BUN: 10 mg/dL (ref 6–20)
CO2: 25 mmol/L (ref 22–32)
Calcium: 9.3 mg/dL (ref 8.9–10.3)
Chloride: 107 mmol/L (ref 98–111)
Creatinine, Ser: 0.74 mg/dL (ref 0.44–1.00)
GFR, Estimated: >60 mL/min (ref >60)
Glucose, Bld: 101 mg/dL — ABNORMAL HIGH (ref 70–99)
Potassium: 3.9 mmol/L (ref 3.5–5.1)
Sodium: 138 mmol/L (ref 135–145)

## 2022-04-10 LAB — CBC WITH DIFFERENTIAL/PLATELET
Abs Immature Granulocytes: 0.01 10*3/uL (ref 0.00–0.07)
Basophils Absolute: 0 10*3/uL (ref 0.0–0.1)
Basophils Relative: 0 %
Eosinophils Absolute: 0.2 10*3/uL (ref 0.0–0.5)
Eosinophils Relative: 3 %
HCT: 42.3 % (ref 36.0–46.0)
Hemoglobin: 14.2 g/dL (ref 12.0–15.0)
Immature Granulocytes: 0 %
Lymphocytes Relative: 40 %
Lymphs Abs: 2.1 10*3/uL (ref 0.7–4.0)
MCH: 30.5 pg (ref 26.0–34.0)
MCHC: 33.6 g/dL (ref 30.0–36.0)
MCV: 90.8 fL (ref 80.0–100.0)
Monocytes Absolute: 0.4 10*3/uL (ref 0.1–1.0)
Monocytes Relative: 8 %
Neutro Abs: 2.5 10*3/uL (ref 1.7–7.7)
Neutrophils Relative %: 49 %
Platelets: 234 10*3/uL (ref 150–400)
RBC: 4.66 MIL/uL (ref 3.87–5.11)
RDW: 12.8 % (ref 11.5–15.5)
WBC: 5.2 10*3/uL (ref 4.0–10.5)
nRBC: 0 % (ref 0.0–0.2)

## 2022-04-10 LAB — WET PREP, GENITAL
Clue Cells Wet Prep HPF POC: NONE SEEN
Sperm: NONE SEEN
Trich, Wet Prep: NONE SEEN
WBC, Wet Prep HPF POC: >=10 — AB (ref <10)
Yeast Wet Prep HPF POC: NONE SEEN

## 2022-04-10 LAB — RESP PANEL BY RT-PCR (FLU A&B, COVID) ARPGX2
Influenza A by PCR: NEGATIVE
Influenza B by PCR: NEGATIVE
SARS Coronavirus 2 by RT PCR: NEGATIVE

## 2022-04-10 LAB — HIV ANTIBODY (ROUTINE TESTING W REFLEX): HIV Screen 4th Generation wRfx: NONREACTIVE

## 2022-04-10 MED ORDER — CEFTRIAXONE SODIUM 1 G IJ SOLR
500.0000 mg | Freq: Once | INTRAMUSCULAR | Status: AC
  Administered 2022-04-10: 500 mg via INTRAMUSCULAR
  Filled 2022-04-10: qty 10

## 2022-04-10 MED ORDER — ONDANSETRON HCL 4 MG PO TABS: 4.0000 mg | ORAL_TABLET | Freq: Three times a day (TID) | ORAL | 0 refills | Status: DC | PRN

## 2022-04-10 MED ORDER — DOXYCYCLINE HYCLATE 100 MG PO CAPS: 100.0000 mg | ORAL_CAPSULE | Freq: Two times a day (BID) | ORAL | 0 refills | Status: AC

## 2022-04-10 NOTE — ED Provider Notes (Signed)
Patient seen by Dr. Matilde Sprang.  Patient is being treated with doxycycline for possible STI.  Wet prep was pending at the time of shift change.  No signs of clue cells or trichomonas.  Patient to add Flagyl for her regimen.  Stable for discharge on her doxycycline prescription   Linwood Dibbles, MD 04/10/22 1656

## 2022-04-10 NOTE — ED Triage Notes (Signed)
C/o headache, n/v with emesis x3 this am, vaginal discharge with itching and bumps on labia, body aches, left flank pain, and urinary frequency with burning.  Pt reports son is sick.  Denies hx of HTN, BP elevated in triage.

## 2022-04-10 NOTE — ED Provider Triage Note (Signed)
Emergency Medicine Provider Triage Evaluation Note  Heidi Estes , a 26 y.o. female  was evaluated in triage.  Pt complains of weakness, Pt reports heavy period for 4 days.  Pt reports she has a vaginal discharge an odor.  Pt reports child at home with a cough  Pt reports she has been told that she is prediabetic .  Review of Systems  Positive: weakness Negative: fever Physical Exam  BP (!) 183/126   Pulse 79   Temp 98.6 F (37 C) (Oral)   Ht 5\' 2"  (1.575 m)   Wt 86 kg   LMP 04/03/2022   SpO2 97%   BMI 34.68 kg/m  Gen:   Awake, no distress   Resp:  Normal effort  MSK:   Moves extremities without difficulty  Other:    Medical Decision Making  Medically screening exam initiated at 11:19 AM.  Appropriate orders placed.  Heidi Estes was informed that the remainder of the evaluation will be completed by another provider, this initial triage assessment does not replace that evaluation, and the importance of remaining in the ED until their evaluation is complete.     Jackelyn Knife, Elson Areas 04/10/22 1121

## 2022-04-10 NOTE — Discharge Instructions (Signed)
You were seen for your vaginal discharge and had labs sent for sexually transmitted infections.  You were treated for gonorrhea with ceftriaxone in the emergency department.  Please take the doxycycline we have prescribed you to treat possible chlamydia.  Follow-up with your primary doctor soon as possible if you do not have a primary doctor you may use the [facility] information listed in this packet to set up an appointment.  Return immediately to the emergency department if you experience any of the following: Fevers, vomiting despite the medications, severe abdominal pain, or any other concerning symptoms.

## 2022-04-10 NOTE — ED Provider Notes (Signed)
[facility]-EMERGENCY DEPT Provider Note   CSN: [id number] Arrival date & time: 04/10/22  1055     History  Chief Complaint  Patient presents with   Headache   Emesis   Flank Pain    Heidi Estes is a 26 y.o. female.  26 year old female with a history of syphilis (previously treated) who presents to the emergency department with multiple complaints.  Says that she has had several days of runny nose and headache in the setting of her son who was sick with a URI recently.  Says that she has also had chills and fatigue.  Denies shortness of breath or chest pain.  States that today had an episode of nonbloody nonbilious emesis and nausea without abdominal pain.  Has had diarrhea in the past several days.  Also reports vaginal discharge and is concerned about STIs since she is having unprotected sex.  Also having dysuria.       Home Medications Prior to Admission medications   Medication Sig Start Date End Date Taking? Authorizing Provider  doxycycline (VIBRAMYCIN) 100 MG capsule Take 1 capsule (100 mg total) by mouth 2 (two) times daily for 7 days. 04/10/22 04/17/22  Rondel Baton, MD  fluticasone (FLONASE) 50 MCG/ACT nasal spray Place 2 sprays into both nostrils in the morning and at bedtime for 15 days. 06/30/21 07/15/21  Edwin Dada P, DO  guaiFENesin-dextromethorphan (ROBITUSSIN DM) 100-10 MG/5ML syrup Take 10 mLs by mouth every 4 (four) hours as needed for cough. 06/30/21   Edwin Dada P, DO  hydrocortisone 2.5 % lotion Apply topically 2 (two) times daily. Apply to skin where areas of irrigation occur. Do not apply to face. 06/30/21   Edwin Dada P, DO  metroNIDAZOLE (FLAGYL) 500 MG tablet Take 1 tablet (500 mg total) by mouth 2 (two) times daily. 04/10/21   Muthersbaugh, Dahlia Client, PA-C  omeprazole (PRILOSEC) 20 MG capsule Take 1 capsule (20 mg total) by mouth daily. 10/08/21   Terrilee Files, MD  ondansetron (ZOFRAN) 4 MG tablet Take 1 tablet (4 mg  total) by mouth every 8 (eight) hours as needed for nausea or vomiting. 04/10/22   Rondel Baton, MD      Allergies    Nitrofurantoin monohyd macro, Macrobid [nitrofurantoin monohyd macro], and Other    Review of Systems   Review of Systems  Physical Exam Updated Vital Signs BP (!) 164/141 (BP Location: Left Arm)   Pulse (!) 104   Temp 98.3 F (36.8 C) (Oral)   Resp 18   Ht 5\' 2"  (1.575 m)   Wt 86 kg   LMP 04/03/2022   SpO2 100%   BMI 34.68 kg/m  Physical Exam Vitals and nursing note reviewed.  Constitutional:      General: She is not in acute distress.    Appearance: She is well-developed.  HENT:     Head: Normocephalic and atraumatic.     Right Ear: External ear normal.     Left Ear: External ear normal.     Nose: Nose normal.  Eyes:     Extraocular Movements: Extraocular movements intact.     Conjunctiva/sclera: Conjunctivae normal.     Pupils: Pupils are equal, round, and reactive to light.  Cardiovascular:     Rate and Rhythm: Normal rate and regular rhythm.     Heart sounds: No murmur heard. Pulmonary:     Effort: Pulmonary effort is normal. No respiratory distress.     Breath sounds: Normal breath sounds.  Abdominal:     General: Abdomen is flat. There is no distension.     Palpations: Abdomen is soft. There is no mass.     Tenderness: There is no abdominal tenderness. There is no guarding.  Genitourinary:    Comments: Chaperoned by RN Heron Nay.  External genitalia unremarkable. Nor rashes or lesions noted.  Speculum exam with normal mucoid appearing yellowish vaginal discharge.  Vaginal wall mucosa is unremarkable.  Cervix visualized and is unremarkable (closed in appearance without any protruding material).  Bimanual exam without cervical motion tenderness, adnexal tenderness or any masses appreciated.  Musculoskeletal:     Cervical back: Normal range of motion and neck supple.     Right lower leg: No edema.     Left lower leg: No edema.   Skin:    General: Skin is warm and dry.     Capillary Refill: Capillary refill takes less than 2 seconds.  Neurological:     Mental Status: She is alert and oriented to person, place, and time. Mental status is at baseline.  Psychiatric:        Mood and Affect: Mood normal.     ED Results / Procedures / Treatments   Labs (all labs ordered are listed, but only abnormal results are displayed) Labs Reviewed  WET PREP, GENITAL - Abnormal; Notable for the following components:      Result Value   WBC, Wet Prep HPF POC >=10 (*)    All other components within normal limits  BASIC METABOLIC PANEL - Abnormal; Notable for the following components:   Glucose, Bld 101 (*)    All other components within normal limits  RESP PANEL BY RT-PCR (FLU A&B, COVID) ARPGX2  CBC WITH DIFFERENTIAL/PLATELET  URINALYSIS, ROUTINE W REFLEX MICROSCOPIC  HIV ANTIBODY (ROUTINE TESTING W REFLEX)  RPR  GC/CHLAMYDIA PROBE AMP ([place]) NOT AT [facility]    EKG None  Radiology No results found.  Procedures Procedures   Medications Ordered in ED Medications  cefTRIAXone (ROCEPHIN) injection 500 mg (500 mg Intramuscular Given 04/10/22 1551)    ED Course/ Medical Decision Making/ A&P Clinical Course as of 04/10/22 1636  Wed Apr 10, 2022  1611 Signed out to Dr Lynelle Doctor awaiting labs.  [RP]    Clinical Course User Index [RP] Rondel Baton, MD                           Medical Decision Making Amount and/or Complexity of Data Reviewed Labs: ordered.  Risk Prescription drug management.   Heidi Estes is a 26 y.o. female with comorbidities that complicate the patient evaluation including previously treated syphilis who presents with chief complaint of URI type symptoms and vaginal discharge.  Initial Ddx:  Gastroenteritis, URI, pneumonia, STI, PID, UTI  MDM:  Feel the patient may have a URI gastroenteritis in the setting of her son who had similar symptoms.  Does not appear overall sick and  does not have any lung sounds that would be concerning for pneumonia at this time.  Based on her pelvic exam do not feel that the patient has PID but may potentially have an STI so we will treat empirically.  We will also check her urine for UTI given her urinary symptoms.  Plan:  Labs GC chlamydia COVID/flu Urinalysis IM ceftriaxone Doxycycline prescription  ED Summary/Re-evaluation:  Patient was reassessed and was stable.  Labs not show any concerning findings.  COVID and flu were negative.  She was  signed out to Dr. Lynelle Doctor awaiting wet prep results as well as urinalysis.  Zofran and doxycycline were sent to her pharmacy.  This patient presents to the ED for concern of complaints listed in HPI, this involves an extensive number of treatment options, and is a complaint that carries with it a high risk of complications and morbidity. Disposition including potential need for admission considered.   Dispo: Pending remainder of workup  Records reviewed Outpatient Clinic Notes The following labs were independently interpreted: Chemistry and show no acute abnormality I personally reviewed and interpreted cardiac monitoring: normal sinus rhythm  I have reviewed the patients home medications and made adjustments as needed  Final Clinical Impression(s) / ED Diagnoses Final diagnoses:  Vaginal discharge  Nausea and vomiting, unspecified vomiting type    Rx / DC Orders ED Discharge Orders          Ordered    doxycycline (VIBRAMYCIN) 100 MG capsule  2 times daily        04/10/22 1524    ondansetron (ZOFRAN) 4 MG tablet  Every 8 hours PRN        04/10/22 1634              Rondel Baton, MD 04/10/22 1637

## 2022-04-11 LAB — RPR: RPR Ser Ql: NONREACTIVE

## 2022-04-11 LAB — GC/CHLAMYDIA PROBE AMP (~~LOC~~) NOT AT ARMC
Chlamydia: NEGATIVE
Comment: NEGATIVE
Comment: NORMAL
Neisseria Gonorrhea: NEGATIVE

## 2022-05-21 ENCOUNTER — Emergency Department (HOSPITAL_COMMUNITY)
Admission: EM | Admit: 2022-05-21 | Discharge: 2022-05-21 | Disposition: A | Discharge status: Home / Self Care | Payer: Medicaid Other | Attending: Emergency Medicine | Admitting: Emergency Medicine

## 2022-05-21 DIAGNOSIS — X58XXXA Exposure to other specified factors, initial encounter: Secondary | ICD-10-CM | POA: Insufficient documentation

## 2022-05-21 DIAGNOSIS — F17290 Nicotine dependence, other tobacco product, uncomplicated: Secondary | ICD-10-CM | POA: Diagnosis not present

## 2022-05-21 DIAGNOSIS — M546 Pain in thoracic spine: Secondary | ICD-10-CM | POA: Diagnosis present

## 2022-05-21 DIAGNOSIS — S29019A Strain of muscle and tendon of unspecified wall of thorax, initial encounter: Secondary | ICD-10-CM

## 2022-05-21 LAB — URINALYSIS, ROUTINE W REFLEX MICROSCOPIC
Bilirubin Urine: NEGATIVE
Glucose, UA: NEGATIVE mg/dL
Ketones, ur: NEGATIVE mg/dL
Nitrite: NEGATIVE
Protein, ur: NEGATIVE mg/dL
Specific Gravity, Urine: 1.02 (ref 1.005–1.030)
pH: 7 (ref 5.0–8.0)

## 2022-05-21 LAB — PREGNANCY, URINE: Preg Test, Ur: NEGATIVE

## 2022-05-21 MED ORDER — KETOROLAC TROMETHAMINE 15 MG/ML IJ SOLN
15.0000 mg | Freq: Once | INTRAMUSCULAR | Status: AC
  Administered 2022-05-21: 15 mg via INTRAMUSCULAR
  Filled 2022-05-21: qty 1

## 2022-05-21 MED ORDER — LIDOCAINE 4 % EX PTCH: 1.0000 | MEDICATED_PATCH | CUTANEOUS | 0 refills | Status: AC

## 2022-05-21 MED ORDER — NAPROXEN 500 MG PO TABS: 500.0000 mg | ORAL_TABLET | Freq: Two times a day (BID) | ORAL | 0 refills | Status: DC

## 2022-05-21 NOTE — Discharge Instructions (Addendum)
We evaluated you for your back pain.  Your physical exam did not show signs of any dangerous cause of back pain.  Please take the medication we have prescribed.  You can also take 1000 mg of Tylenol every 6 hours as needed for pain.  Please return to the emergency department if you develop any incontinence, numbness or tingling in your legs, difficulty walking, fevers, or any other concerning symptoms.

## 2022-05-21 NOTE — ED Provider Notes (Signed)
[facility]-EMERGENCY DEPT Provider Note  CSN: [id number] Arrival date & time: 05/21/22 0442  Chief Complaint(s) Back Pain  HPI Heidi Estes is a 27 y.o. female without significant past medical history presenting with back pain.  She reports back pain in her mid back.  She reports that it goes away when she takes Tylenol or Motrin.  She is concerned that comes back once her pain medication wears off.  She denies any numbness, tingling, bowel or bladder incontinence, trouble with ambulation, fevers or chills, history of IV drug use, trauma to the back.  She reports that the back pain also hurts when she takes deep breath but denies any recent travel or surgeries, shortness of breath, cough, OCP use.  Denies any history of blood clots.  She denies any pain with urination reports the back pain gets worse when she is straining to urinate.   Past Medical History Past Medical History:  Diagnosis Date   Multiple allergies    Postpartum depression    STD (female)    Patient Active Problem List   Diagnosis Date Noted   Post term pregnancy, 41 weeks 03/04/2015   Obesity affecting pregnancy in third trimester, antepartum    Home Medication(s) Prior to Admission medications   Medication Sig Start Date End Date Taking? Authorizing Provider  lidocaine (HM LIDOCAINE PATCH) 4 % Place 1 patch onto the skin daily. 05/21/22  Yes Lonell Grandchild, MD  naproxen (NAPROSYN) 500 MG tablet Take 1 tablet (500 mg total) by mouth 2 (two) times daily. 05/21/22  Yes Lonell Grandchild, MD  fluticasone (FLONASE) 50 MCG/ACT nasal spray Place 2 sprays into both nostrils in the morning and at bedtime for 15 days. 06/30/21 07/15/21  Edwin Dada P, DO  guaiFENesin-dextromethorphan (ROBITUSSIN DM) 100-10 MG/5ML syrup Take 10 mLs by mouth every 4 (four) hours as needed for cough. 06/30/21   Edwin Dada P, DO  hydrocortisone 2.5 % lotion Apply topically 2 (two) times daily. Apply to skin where areas of  irrigation occur. Do not apply to face. 06/30/21   Edwin Dada P, DO  metroNIDAZOLE (FLAGYL) 500 MG tablet Take 1 tablet (500 mg total) by mouth 2 (two) times daily. 04/10/21   Muthersbaugh, Dahlia Client, PA-C  omeprazole (PRILOSEC) 20 MG capsule Take 1 capsule (20 mg total) by mouth daily. 10/08/21   Terrilee Files, MD  ondansetron (ZOFRAN) 4 MG tablet Take 1 tablet (4 mg total) by mouth every 8 (eight) hours as needed for nausea or vomiting. 04/10/22   Rondel Baton, MD                                                                                                                                    Past Surgical History Past Surgical History:  Procedure Laterality Date   COSMETIC SURGERY N/A    tummy tuck   WISDOM TOOTH EXTRACTION Bilateral 07/2013   x4  Family History Family History  Problem Relation Age of Onset   Asthma Mother    Hypertension Mother    Hypertension Maternal Aunt     Social History Social History   Tobacco Use   Smoking status: Every Day    Types: Cigars   Smokeless tobacco: Never  Vaping Use   Vaping Use: Never used  Substance Use Topics   Alcohol use: Yes    Comment: Occassional    Drug use: No   Allergies Nitrofurantoin monohyd macro, Macrobid [nitrofurantoin monohyd macro], and Other  Review of Systems Review of Systems  All other systems reviewed and are negative.   Physical Exam Vital Signs  I have reviewed the triage vital signs BP (!) 122/90 (BP Location: Left Arm)   Pulse 90   Temp 98 F (36.7 C) (Oral)   Resp 18   Ht 5\' 2"  (1.575 m)   Wt 94.8 kg   SpO2 100%   BMI 38.23 kg/m  Physical Exam Vitals and nursing note reviewed.  Constitutional:      Appearance: Normal appearance. She is obese.  HENT:     Head: Normocephalic and atraumatic.     Mouth/Throat:     Mouth: Mucous membranes are moist.  Eyes:     Conjunctiva/sclera: Conjunctivae normal.  Cardiovascular:     Rate and Rhythm: Normal rate.  Pulmonary:     Effort:  Pulmonary effort is normal. No respiratory distress.  Abdominal:     General: Abdomen is flat.     Tenderness: There is no right CVA tenderness or left CVA tenderness.  Musculoskeletal:        General: No deformity.     Comments: No focal tenderness of the midline or paraspinal C, T, L-spine  Skin:    General: Skin is warm and dry.     Capillary Refill: Capillary refill takes less than 2 seconds.  Neurological:     General: No focal deficit present.     Mental Status: She is alert. Mental status is at baseline.     Comments: Strength 5 out of 5 in the bilateral lower extremities, no sensory deficit to light touch, 2+ patellar reflexes bilaterally, normal gait  Psychiatric:        Mood and Affect: Mood normal.        Behavior: Behavior normal.     ED Results and Treatments Labs (all labs ordered are listed, but only abnormal results are displayed) Labs Reviewed  URINALYSIS, ROUTINE W REFLEX MICROSCOPIC - Abnormal; Notable for the following components:      Result Value   APPearance HAZY (*)    Hgb urine dipstick SMALL (*)    Leukocytes,Ua LARGE (*)    Bacteria, UA RARE (*)    All other components within normal limits  PREGNANCY, URINE  Radiology No results found.  Pertinent labs & imaging results that were available during my care of the patient were reviewed by me and considered in my medical decision making (see MDM for details).  Medications Ordered in ED Medications  ketorolac (TORADOL) 15 MG/ML injection 15 mg (has no administration in time range)                                                                                                                                     Procedures Procedures  (including critical care time)  Medical Decision Making / ED Course   MDM:  26 year old female presenting with back pain.  Patient  well-appearing, physical exam unremarkable.  No focal tenderness.  Neurologic exam reassuring.  Suspect back strain or sprain.  No trauma to suggest that patient has fracture and she has no focal tenderness.  Doubt occult infectious process.  No CVA tenderness or dysuria to suggest ascending urinary infection.  Doubt occult spinal cord compression, no numbness or tingling and reassuring neurologic exam.  Discussed self-care for back pain.  Patient also concerned about her dog licked her eye and this may somehow be related to her back pain.  No evidence of conjunctivitis, no visual complaints.  Very low concern that this is all related. Will discharge patient to home. All questions answered. Patient comfortable with plan of discharge. Return precautions discussed with patient and specified on the after visit summary.       Additional history obtained: -Additional history obtained from family    Lab Tests: -I ordered, reviewed, and interpreted labs.   The pertinent results include:   Labs Reviewed  URINALYSIS, ROUTINE W REFLEX MICROSCOPIC - Abnormal; Notable for the following components:      Result Value   APPearance HAZY (*)    Hgb urine dipstick SMALL (*)    Leukocytes,Ua LARGE (*)    Bacteria, UA RARE (*)    All other components within normal limits  PREGNANCY, URINE    Notable for indeterminate UA but no dysuria    Medicines ordered and prescription drug management: Meds ordered this encounter  Medications   naproxen (NAPROSYN) 500 MG tablet    Sig: Take 1 tablet (500 mg total) by mouth 2 (two) times daily.    Dispense:  30 tablet    Refill:  0   lidocaine (HM LIDOCAINE PATCH) 4 %    Sig: Place 1 patch onto the skin daily.    Dispense:  30 patch    Refill:  0   ketorolac (TORADOL) 15 MG/ML injection 15 mg    -I have reviewed the patients home medicines and have made adjustments as needed   Social Determinants of Health:  Diagnosis or treatment significantly limited  by social determinants of health: obesity   Reevaluation: After the interventions noted above, I reevaluated the patient and found that they have improved  Co morbidities that complicate the patient  evaluation  Past Medical History:  Diagnosis Date   Multiple allergies    Postpartum depression    STD (female)       Dispostion: Disposition decision including need for hospitalization was considered, and patient discharged from emergency department.    Final Clinical Impression(s) / ED Diagnoses Final diagnoses:  Thoracic myofascial strain, initial encounter     This chart was dictated using voice recognition software.  Despite best efforts to proofread,  errors can occur which can change the documentation meaning.    Cristie Hem, MD 05/21/22 [phone]

## 2022-05-21 NOTE — ED Triage Notes (Signed)
Pt reports lower, midline back pain for 2 days. States that no position is comfortable. Denies injury. Denies radiation or leg weakness/ numbness. She states that the pain seems to "spasm when urinating." She has tried ibuprofen with little relief.   Also reports that her dog has a dry cough and licked her face yesterday and some of his saliva may have entered her eye.

## 2022-07-30 ENCOUNTER — Encounter: Payer: Medicaid Other | Admitting: Obstetrics & Gynecology

## 2022-08-12 ENCOUNTER — Ambulatory Visit (INDEPENDENT_AMBULATORY_CARE_PROVIDER_SITE_OTHER): Payer: Medicaid Other | Admitting: Family Medicine

## 2022-08-12 ENCOUNTER — Other Ambulatory Visit (HOSPITAL_COMMUNITY)
Admission: RE | Admit: 2022-08-12 | Discharge: 2022-08-12 | Disposition: A | Discharge status: Home / Self Care | Payer: Medicaid Other | Source: Ambulatory Visit | Attending: Obstetrics & Gynecology | Admitting: Obstetrics & Gynecology

## 2022-08-12 ENCOUNTER — Encounter: Payer: Self-pay | Admitting: Family Medicine

## 2022-08-12 VITALS — BP 127/89 | HR 84 | Ht 62.0 in | Wt 211.0 lb

## 2022-08-12 DIAGNOSIS — Z124 Encounter for screening for malignant neoplasm of cervix: Secondary | ICD-10-CM

## 2022-08-12 DIAGNOSIS — M6289 Other specified disorders of muscle: Secondary | ICD-10-CM

## 2022-08-12 DIAGNOSIS — Z3041 Encounter for surveillance of contraceptive pills: Secondary | ICD-10-CM

## 2022-08-12 DIAGNOSIS — N898 Other specified noninflammatory disorders of vagina: Secondary | ICD-10-CM | POA: Diagnosis present

## 2022-08-12 DIAGNOSIS — Z113 Encounter for screening for infections with a predominantly sexual mode of transmission: Secondary | ICD-10-CM

## 2022-08-12 MED ORDER — NORGESTIMATE-ETH ESTRADIOL 0.25-35 MG-MCG PO TABS: 1.0000 | ORAL_TABLET | Freq: Every day | ORAL | 11 refills | Status: AC

## 2022-08-12 NOTE — Progress Notes (Signed)
NGYN presents for heavy periods for the last year. Pt last 7-10 days, using 5 Pose pads per day. Pt reports clots and dehydration when on cycle. Last PAP unknown. Pt requesting PAP and STD testing.  Pt c/o vaginal lactation that occurred mouths ago during intercourse. Pt states partners noticed "dent" in vaginal canal. Pt also c/o UTI symptoms.   Pt requesting BC pills. Last unprotected sex 6 months ago.

## 2022-08-12 NOTE — Progress Notes (Cosign Needed Addendum)
   GYNECOLOGY OFFICE VISIT NOTE  History:   Heidi Estes is a 27 y.o. [id number] here today for concerns about vaginal irritation. Has also noticed decreased orgasm since having children. Youngest child in 2 years old now. She also had some exposure to STI and would like to be screened. Has noticed slightly heavier periods recently. Previously on the OCPs, but ran out of them. Would like to restart this.    Past Medical History:  Diagnosis Date   Multiple allergies    Postpartum depression    STD (female)     Past Surgical History:  Procedure Laterality Date   COSMETIC SURGERY N/A    tummy tuck   WISDOM TOOTH EXTRACTION Bilateral 07/2013   x4    The following portions of the patient's history were reviewed and updated as appropriate: allergies, current medications, past family history, past medical history, past social history, past surgical history and problem list.   Health Maintenance:  Pap smear due. Does not remember last pap.  Review of Systems:  Pertinent items noted in HPI and remainder of comprehensive ROS otherwise negative.  Physical Exam:  BP 127/89   Pulse 84   Ht 5\' 2"  (1.575 m)   Wt 211 lb (95.7 kg)   BMI 38.59 kg/m  CONSTITUTIONAL: Well-developed, well-nourished female in no acute distress.  HEENT:  Normocephalic, atraumatic. External right and left ear normal. No scleral icterus.  NECK: Normal range of motion, supple, no masses noted on observation SKIN: No rash noted. Not diaphoretic. No erythema. No pallor. MUSCULOSKELETAL: Normal range of motion. No edema noted. NEUROLOGIC: Alert and oriented to person, place, and time. Normal muscle tone coordination. No cranial nerve deficit noted. PSYCHIATRIC: Normal mood and affect. Normal behavior. Normal judgment and thought content. CARDIOVASCULAR: Normal heart rate noted RESPIRATORY: Effort and breath sounds normal, no problems with respiration noted ABDOMEN: No masses noted. No other overt distention noted.    PELVIC: Normal appearing external genitalia; normal urethral meatus; normal appearing vaginal mucosa and cervix.  No abnormal discharge noted.  Normal uterine size, no other palpable masses, no uterine or adnexal tenderness. Performed in the presence of a chaperone  Labs and Imaging No results found for this or any previous visit (from the past 168 hour(s)). No results found.    Assessment and Plan:    1. Vaginal irritation Possibly 2/2 to infection, no obvious lesions in vulva or vaginal area noted on examination.  - Cervicovaginal ancillary only( [place])  2. Routine screening for STI (sexually transmitted infection) Discussed safe sex. Labs ordered.  - HIV antibody (with reflex) - RPR - Hepatitis C Antibody - Hepatitis B Surface AntiGEN  3. Cervical cancer screening - Cytology - PAP( [place])  4. Pelvic floor dysfunction Possibly impacting orgasm. Discussed pelvic floor therapy and she will like to try this. - Ambulatory referral to Physical Therapy  5. Encounter for surveillance of contraceptive pills Very intermittent smoking. Discussed contraindication of OCPs and smoking after age 18. Encouraged to quit completely.  - norgestimate-ethinyl estradiol (ORTHO-CYCLEN) 0.25-35 MG-MCG tablet; Take 1 tablet by mouth daily.  Dispense: 28 tablet; Refill: 11  Routine preventative health maintenance measures emphasized. Please refer to After Visit Summary for other counseling recommendations.    I spent  38  minutes dedicated to the care of this patient including pre-visit review of records, face to face time with the patient discussing her conditions and treatments and post visit orders.

## 2022-08-13 LAB — CERVICOVAGINAL ANCILLARY ONLY
Bacterial Vaginitis (gardnerella): NEGATIVE
Candida Glabrata: NEGATIVE
Candida Vaginitis: POSITIVE — AB
Chlamydia: NEGATIVE
Comment: NEGATIVE
Comment: NEGATIVE
Comment: NEGATIVE
Comment: NEGATIVE
Comment: NEGATIVE
Comment: NORMAL
Neisseria Gonorrhea: NEGATIVE
Trichomonas: NEGATIVE

## 2022-08-13 LAB — HIV ANTIBODY (ROUTINE TESTING W REFLEX): HIV Screen 4th Generation wRfx: NONREACTIVE

## 2022-08-13 LAB — HEPATITIS B SURFACE ANTIGEN: Hepatitis B Surface Ag: NEGATIVE

## 2022-08-13 LAB — HEPATITIS C ANTIBODY: Hep C Virus Ab: NONREACTIVE

## 2022-08-13 LAB — RPR: RPR Ser Ql: NONREACTIVE

## 2022-08-14 ENCOUNTER — Other Ambulatory Visit: Payer: Self-pay | Admitting: Family Medicine

## 2022-08-14 DIAGNOSIS — B3731 Acute candidiasis of vulva and vagina: Secondary | ICD-10-CM

## 2022-08-14 MED ORDER — TERCONAZOLE 0.4 % VA CREA: 1.0000 | TOPICAL_CREAM | Freq: Every day | VAGINAL | 0 refills | Status: AC

## 2022-08-16 LAB — CYTOLOGY - PAP
Comment: NEGATIVE
Diagnosis: UNDETERMINED — AB
High risk HPV: POSITIVE — AB

## 2022-08-20 NOTE — Progress Notes (Signed)
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  I recommend that Pap smear be repeated in 1 year. This is really important. Kindly place a reminder to come back in 07/2023 for a repeat pap smear, to prevent cervical cancer in the future. Thank you   Dr Trina Ao.

## 2022-08-21 ENCOUNTER — Emergency Department (HOSPITAL_COMMUNITY)
Admission: EM | Admit: 2022-08-21 | Discharge: 2022-08-21 | Disposition: A | Discharge status: Home / Self Care | Payer: Medicaid Other | Attending: Emergency Medicine | Admitting: Emergency Medicine

## 2022-08-21 ENCOUNTER — Other Ambulatory Visit: Payer: Self-pay

## 2022-08-21 ENCOUNTER — Encounter (HOSPITAL_COMMUNITY): Payer: Self-pay

## 2022-08-21 DIAGNOSIS — I10 Essential (primary) hypertension: Secondary | ICD-10-CM | POA: Insufficient documentation

## 2022-08-21 DIAGNOSIS — L989 Disorder of the skin and subcutaneous tissue, unspecified: Secondary | ICD-10-CM

## 2022-08-21 DIAGNOSIS — Z7185 Encounter for immunization safety counseling: Secondary | ICD-10-CM

## 2022-08-21 DIAGNOSIS — N898 Other specified noninflammatory disorders of vagina: Secondary | ICD-10-CM | POA: Diagnosis not present

## 2022-08-21 NOTE — ED Triage Notes (Signed)
Patient is here for evaluation of bump on vagina. Pt is also asking for HPV vaccines and all her other vaccinations current. Informed patient we do not give vaccines in the ER.

## 2022-08-21 NOTE — ED Provider Notes (Signed)
[facility] Provider Note   CSN: [id number] Arrival date & time: 08/21/22  1754     History  Chief Complaint  Patient presents with   Vaginal Issue    REBEKA LEVON is a 27 y.o. female with noncontributory past medical history presents with concern for bump on the vagina.  Patient reports that she already had a vaginal bump evaluated, was here today just for HPV vaccines.  Patient did not understand that we do not give HPV vaccines in the emergency department.  HPI     Home Medications Prior to Admission medications   Medication Sig Start Date End Date Taking? Authorizing Provider  terconazole (TERAZOL 7) 0.4 % vaginal cream Place 1 applicator vaginally at bedtime. Use for 7 days 08/14/22   Ndulue, Chiagoziem J, MD  fluticasone (FLONASE) 50 MCG/ACT nasal spray Place 2 sprays into both nostrils in the morning and at bedtime for 15 days. 06/30/21 [id number]  Campbell Stall P, DO  lidocaine (HM LIDOCAINE PATCH) 4 % Place 1 patch onto the skin daily. Patient not taking: Reported on 08/12/2022 05/21/22   Cristie Hem, MD  norgestimate-ethinyl estradiol (ORTHO-CYCLEN) 0.25-35 MG-MCG tablet Take 1 tablet by mouth daily. 08/12/22   Ndulue, Lyndel Safe, MD      Allergies    Nitrofurantoin monohyd macro, Macrobid [nitrofurantoin monohyd macro], and Other    Review of Systems   Review of Systems  All other systems reviewed and are negative.   Physical Exam Updated Vital Signs BP (!) 153/116 (BP Location: Left Arm)   Pulse 86   Temp 98.4 F (36.9 C) (Oral)   Resp 18   Ht 5\' 2"  (1.575 m)   Wt 95.7 kg   SpO2 100%   BMI 38.59 kg/m  Physical Exam Vitals and nursing note reviewed.  Constitutional:      General: She is not in acute distress.    Appearance: Normal appearance.  HENT:     Head: Normocephalic and atraumatic.  Eyes:     General:        Right eye: No discharge.        Left eye: No discharge.  Cardiovascular:     Rate  and Rhythm: Normal rate and regular rhythm.  Pulmonary:     Effort: Pulmonary effort is normal. No respiratory distress.  Genitourinary:    Comments: Patient declines vaginal exam in the emergency department Musculoskeletal:        General: No deformity.  Skin:    General: Skin is warm and dry.  Neurological:     Mental Status: She is alert and oriented to person, place, and time.  Psychiatric:        Mood and Affect: Mood normal.        Behavior: Behavior normal.     ED Results / Procedures / Treatments   Labs (all labs ordered are listed, but only abnormal results are displayed) Labs Reviewed - No data to display  EKG None  Radiology No results found.  Procedures Procedures    Medications Ordered in ED Medications - No data to display  ED Course/ Medical Decision Making/ A&P                             Medical Decision Making  This patient is a 27 y.o. female who presents to the ED for concern of bump on vagina, and wanting an HPV vaccine.  Differential diagnoses prior to evaluation: Bartholin gland cyst , Herpes simplex, HPV, versus other  Physical Exam: Physical exam performed. The pertinent findings include: She is well-appearing with stable vital signs other than some hypertension on arrival, blood pressure 153/116.  She declines vaginal exam.  Medications / Treatment: Discussed with patient that we do not offer HPV vaccination in the emergency department she will need to talk to her primary care doctor or the health department   Disposition: After consideration of the diagnostic results and the patients response to treatment, I feel that patient is stable for discharge.   emergency department workup does not suggest an emergent condition requiring admission or immediate intervention beyond what has been performed at this time. The plan is: as above. The patient is safe for discharge and has been instructed to return immediately for worsening symptoms,  change in symptoms or any other concerns.  Final Clinical Impression(s) / ED Diagnoses Final diagnoses:  Bumps on skin  Vaccine counseling    Rx / DC Orders ED Discharge Orders     None         Dorien Chihuahua 08/21/22 1844    Wyvonnia Dusky, MD 08/21/22 2128

## 2022-08-21 NOTE — Discharge Instructions (Signed)
As you did not want a exam of the vagina, and we do not perform any vaccinations in the emergency department I recommend that you go to your PCP for approval for HPV vaccination.

## 2022-08-22 ENCOUNTER — Encounter: Payer: Medicaid Other | Admitting: Student

## 2022-08-22 NOTE — Progress Notes (Deleted)
GYNECOLOGY VIRTUAL VISIT ENCOUNTER NOTE  Provider location: [facility] for [place] [facility]   Patient location: Home  I connected with Heidi Estes on 08/22/22 at  1:50 PM EDT by MyChart Video Encounter and verified that I am speaking with the correct person using two identifiers.   I discussed the limitations, risks, security and privacy concerns of performing an evaluation and management service virtually and the availability of in person appointments. I also discussed with the patient that there may be a patient responsible charge related to this service. The patient expressed understanding and agreed to proceed.   History:  Heidi Estes is a 27 y.o. [phone] female being evaluated today for ***. She denies any abnormal vaginal discharge, bleeding, pelvic pain or other concerns.       Past Medical History:  Diagnosis Date   Multiple allergies    Postpartum depression    STD (female)    Past Surgical History:  Procedure Laterality Date   COSMETIC SURGERY N/A    tummy tuck   WISDOM TOOTH EXTRACTION Bilateral 07/2013   x4   The following portions of the patient's history were reviewed and updated as appropriate: allergies, current medications, past family history, past medical history, past social history, past surgical history and problem list.   Health Maintenance:  Normal pap and negative HRHPV on ***.  Normal mammogram on ***.   Review of Systems:  Pertinent items noted in HPI and remainder of comprehensive ROS otherwise negative.  Physical Exam:   General:  Alert, oriented and cooperative. Patient appears to be in no acute distress.  Mental Status: Normal mood and affect. Normal behavior. Normal judgment and thought content.   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   Diagnosis (A)     - Atypical squamous cells of undetermined significance (ASC-US)   Comment Normal Reference Range HPV - Negative   Cervicovaginal ancillary only( [place])   Collection Time: 08/12/22  3:39 PM  Result Value Ref Range   Chlamydia Negative    Neisseria Gonorrhea Negative    Bacterial Vaginitis (gardnerella) Negative    Candida Vaginitis Positive (A)    Candida Glabrata Negative    Trichomonas Negative    Comment Normal Reference [place] Chlamydia - Negative    Comment      Normal Reference Range Neisseria Gonorrhea - Negative   Comment      Normal Reference Range Bacterial Vaginosis - Negative   Comment Normal Reference Range Candida Species - Negative    Comment Normal Reference Range Candida Galbrata - Negative    Comment Normal Reference Range Trichomonas - Negative   HIV antibody (with reflex)   Collection Time: 08/12/22  4:02 PM  Result Value Ref Range   HIV Screen 4th Generation wRfx Non Reactive Non Reactive  RPR   Collection Time: 08/12/22  4:02 PM  Result Value Ref Range   RPR Ser Ql Non Reactive Non Reactive  Hepatitis C Antibody   Collection Time: 08/12/22  4:02 PM  Result Value Ref Range  Hep C Virus Ab Non Reactive Non Reactive  Hepatitis B Surface AntiGEN   Collection Time: 08/12/22  4:02 PM  Result Value Ref Range   Hepatitis B Surface Ag Negative Negative   No results found.     Assessment and Plan:     There are no diagnoses linked to this encounter.      I discussed the assessment and treatment plan with the patient. The patient was provided an opportunity to ask questions and all were answered. The patient agreed with the plan and demonstrated an understanding of the instructions.   The patient was advised to  call back or seek an in-person evaluation/go to the ED if the symptoms worsen or if the condition fails to improve as anticipated.  I provided *** minutes of face-to-face time during this encounter.   Johnston Ebbs, NP Center for [facility], [place]

## 2022-08-23 ENCOUNTER — Encounter: Payer: Self-pay | Admitting: Family Medicine

## 2022-08-23 ENCOUNTER — Ambulatory Visit: Payer: Medicaid Other | Admitting: Family Medicine

## 2022-08-23 VITALS — BP 141/93 | HR 75 | Ht 62.0 in | Wt 210.8 lb

## 2022-08-23 DIAGNOSIS — R87610 Atypical squamous cells of undetermined significance on cytologic smear of cervix (ASC-US): Secondary | ICD-10-CM

## 2022-08-23 DIAGNOSIS — R87810 Cervical high risk human papillomavirus (HPV) DNA test positive: Secondary | ICD-10-CM | POA: Diagnosis not present

## 2022-08-23 MED ORDER — LORAZEPAM 1 MG PO TABS: 1.0000 mg | ORAL_TABLET | Freq: Once | ORAL | 0 refills | Status: AC | PRN

## 2022-08-23 NOTE — Progress Notes (Signed)
Pt presents to discuss PAP results from 08-12-22. Interested in HPV vaccine.

## 2022-08-23 NOTE — Progress Notes (Signed)
   GYNECOLOGY OFFICE VISIT NOTE  History:   Heidi Estes is a 27 y.o. [id number] here today for Pap result discussion. She is anxious regarding her dx and what the next steps entail. She denies any abnormal vaginal discharge, bleeding, pelvic pain or other concerns.    Past Medical History:  Diagnosis Date   Multiple allergies    Postpartum depression    STD (female)     Past Surgical History:  Procedure Laterality Date   COSMETIC SURGERY N/A    tummy tuck   WISDOM TOOTH EXTRACTION Bilateral 07/2013   x4    The following portions of the patient's history were reviewed and updated as appropriate: allergies, current medications, past family history, past medical history, past social history, past surgical history and problem list.   Health Maintenance:  Abnormal pap and negative HRHPV on 3/25.   Review of Systems:  Pertinent items noted in HPI and remainder of comprehensive ROS otherwise negative.  Physical Exam:  BP (!) 141/93   Pulse 75   Ht 5\' 2"  (1.575 m)   Wt 210 lb 12.8 oz (95.6 kg)   BMI 38.56 kg/m  CONSTITUTIONAL: Well-developed, well-nourished female in no acute distress.  HEENT:  Normocephalic, atraumatic. External right and left ear normal. No scleral icterus.  NECK: Normal range of motion, supple, no masses noted on observation SKIN: No rash noted. Not diaphoretic. No erythema. No pallor. MUSCULOSKELETAL: Normal range of motion. No edema noted. NEUROLOGIC: Alert and oriented to person, place, and time. Normal muscle tone coordination.  PSYCHIATRIC: Normal mood and affect. Normal behavior. Normal judgment and thought content. CARDIOVASCULAR: Normal heart rate noted RESPIRATORY: Effort and breath sounds normal, no problems with respiration noted ABDOMEN: No masses noted. No other overt distention noted.   PELVIC: Deferred  Labs and Imaging No results found for this or any previous visit (from the past 168 hour(s)). No results found.    Assessment and Plan:       1. ASCUS with positive high risk HPV cervical   Pt need colpo appt given HRHPV and ASCUS per ASCCP guidelines. She has procedure anxiety- sent ativan x1 to take prior to colposcopy.   Routine preventative health maintenance measures emphasized. Please refer to After Visit Summary for other counseling recommendations.   Return in about 2 weeks (around 09/06/2022) for colpo procedure.     Myrtie Hawk, DO OB Fellow, Faculty [facility], Center for [facility] 08/23/2022 9:27 AM

## 2022-08-26 ENCOUNTER — Telehealth: Payer: Self-pay | Admitting: *Deleted

## 2022-08-26 NOTE — Progress Notes (Signed)
Patient canceled telehealth appointment

## 2022-08-26 NOTE — Telephone Encounter (Signed)
TC from [facility] pharmacy to clarify RX for Ativan. RX quantity sent was 30, but instructions were to take 1 dose prior to colpo procedure. I reviewed the note from the provider and determined that intended quantity was 1 tab to be take on day of procedure, prior to procedure. RX change authorized.

## 2022-10-01 ENCOUNTER — Encounter: Payer: Medicaid Other | Admitting: Physical Therapy

## 2022-10-01 NOTE — Therapy (Deleted)
OUTPATIENT PHYSICAL THERAPY FEMALE PELVIC EVALUATION   Patient Name: Heidi Estes MRN: [id number] DOB:01-13-1996, 27 y.o., female Today's Date: 10/01/2022  END OF SESSION:   Past Medical History:  Diagnosis Date   Multiple allergies    Postpartum depression    STD (female)    Past Surgical History:  Procedure Laterality Date   COSMETIC SURGERY N/A    tummy tuck   WISDOM TOOTH EXTRACTION Bilateral 07/2013   x4   Patient Active Problem List   Diagnosis Date Noted   ASCUS with positive high risk HPV cervical 08/23/2022   Post term pregnancy, 41 weeks 03/04/2015   Obesity affecting pregnancy in third trimester, antepartum     PCP: None  REFERRING PROVIDER: Alfredia Ferguson, MD   REFERRING DIAG: M62.89 (ICD-10-CM) - Pelvic floor dysfunction  THERAPY DIAG:  No diagnosis found.  Rationale for Evaluation and Treatment: Rehabilitation  ONSET DATE: ***  SUBJECTIVE:                                                                                                                                                                                           SUBJECTIVE STATEMENT: Possibly impacting orgasm. Discussed pelvic floor therapy and she will like to try this.  Fluid intake: {Yes/No:304960894}   PAIN:  Are you having pain? {yes/no:20286} NPRS scale: ***/10 Pain location: {pelvic pain location:27098}  Pain type: {type:313116} Pain description: {PAIN DESCRIPTION:21022940}   Aggravating factors: *** Relieving factors: ***  PRECAUTIONS: None  WEIGHT BEARING RESTRICTIONS: No  FALLS:  Has patient fallen in last 6 months? {fallsyesno:27318}  LIVING ENVIRONMENT: Lives with: {[facility] lives with:25569::"lives with their family"} Lives in: {Lives in:25570} Stairs: {opstairs:27293} Has following equipment at home: {Assistive devices:23999}  OCCUPATION: ***  PLOF: {PLOF:24004}  PATIENT GOALS: ***  PERTINENT HISTORY:  STD Sexual abuse:  {Yes/No:304960894}  BOWEL MOVEMENT: Pain with bowel movement: {yes/no:20286} Type of bowel movement:{PT BM type:27100} Fully empty rectum: {Yes/No:304960894} Leakage: {Yes/No:304960894} Pads: {Yes/No:304960894} Fiber supplement: {Yes/No:304960894}  URINATION: Pain with urination: {yes/no:20286} Fully empty bladder: {Yes/No:304960894} Stream: {PT urination:27102} Urgency: {Yes/No:304960894} Frequency: *** Leakage: {PT leakage:27103} Pads: {Yes/No:304960894}  INTERCOURSE: Pain with intercourse: {pain with intercourse PA:27099} Ability to have vaginal penetration:  {Yes/No:304960894} Climax: *** Marinoff Scale: ***/3  PREGNANCY: Vaginal deliveries *** Tearing {Yes***/No:304960894} C-section deliveries *** Currently pregnant {Yes***/No:304960894}  PROLAPSE: {PT prolapse:27101}   OBJECTIVE:   DIAGNOSTIC FINDINGS:  ***  PATIENT SURVEYS:  {rehab surveys:24030}  PFIQ-7 ***  COGNITION: Overall cognitive status: {cognition:24006}     SENSATION: Light touch: {intact/deficits:24005} Proprioception: {intact/deficits:24005}  MUSCLE LENGTH: Hamstrings: Right *** deg; Left *** deg Thomas test: Right *** deg; Left *** deg  LUMBAR SPECIAL  TESTS:  {lumbar special test:25242}  FUNCTIONAL TESTS:  {Functional tests:24029}  GAIT: Distance walked: *** Assistive device utilized: {Assistive devices:23999} Level of assistance: {Levels of assistance:24026} Comments: ***  POSTURE: {posture:25561}  PELVIC ALIGNMENT:  LUMBARAROM/PROM:  A/PROM A/PROM  eval  Flexion   Extension   Right lateral flexion   Left lateral flexion   Right rotation   Left rotation    (Blank rows = not tested)  LOWER EXTREMITY ROM:  {AROM/PROM:27142} ROM Right eval Left eval  Hip flexion    Hip extension    Hip abduction    Hip adduction    Hip internal rotation    Hip external rotation    Knee flexion    Knee extension    Ankle dorsiflexion    Ankle plantarflexion    Ankle  inversion    Ankle eversion     (Blank rows = not tested)  LOWER EXTREMITY MMT:  MMT Right eval Left eval  Hip flexion    Hip extension    Hip abduction    Hip adduction    Hip internal rotation    Hip external rotation    Knee flexion    Knee extension    Ankle dorsiflexion    Ankle plantarflexion    Ankle inversion    Ankle eversion     PALPATION:   General  ***                External Perineal Exam ***                             Internal Pelvic Floor ***  Patient confirms identification and approves PT to assess internal pelvic floor and treatment {yes/no:20286}  PELVIC MMT:   MMT eval  Vaginal   Internal Anal Sphincter   External Anal Sphincter   Puborectalis   Diastasis Recti   (Blank rows = not tested)        TONE: ***  PROLAPSE: ***  TODAY'S TREATMENT:                                                                                                                              DATE: ***  EVAL ***   PATIENT EDUCATION:  Education details: *** Person educated: {Person educated:25204} Education method: {Education Method:25205} Education comprehension: {Education Comprehension:25206}  HOME EXERCISE PROGRAM: ***  ASSESSMENT:  CLINICAL IMPRESSION: Patient is a *** y.o. *** who was seen today for physical therapy evaluation and treatment for ***.   OBJECTIVE IMPAIRMENTS: {opptimpairments:25111}.   ACTIVITY LIMITATIONS: {activitylimitations:27494}  PARTICIPATION LIMITATIONS: {participationrestrictions:25113}  PERSONAL FACTORS: {Personal factors:25162} are also affecting patient's functional outcome.   REHAB POTENTIAL: {rehabpotential:25112}  CLINICAL DECISION MAKING: {clinical decision making:25114}  EVALUATION COMPLEXITY: {Evaluation complexity:25115}   GOALS: Goals reviewed with patient? {yes/no:20286}  SHORT TERM GOALS: Target date: ***  *** Baseline: Goal status: {GOALSTATUS:25110}  2.  *** Baseline:  Goal status:  {GOALSTATUS:25110}  3.  *** Baseline:  Goal status: {GOALSTATUS:25110}  4.  *** Baseline:  Goal status: {GOALSTATUS:25110}  5.  *** Baseline:  Goal status: {GOALSTATUS:25110}  6.  *** Baseline:  Goal status: {GOALSTATUS:25110}  LONG TERM GOALS: Target date: ***  *** Baseline:  Goal status: {GOALSTATUS:25110}  2.  *** Baseline:  Goal status: {GOALSTATUS:25110}  3.  *** Baseline:  Goal status: {GOALSTATUS:25110}  4.  *** Baseline:  Goal status: {GOALSTATUS:25110}  5.  *** Baseline:  Goal status: {GOALSTATUS:25110}  6.  *** Baseline:  Goal status: {GOALSTATUS:25110}  PLAN:  PT FREQUENCY: {rehab frequency:25116}  PT DURATION: {rehab duration:25117}  PLANNED INTERVENTIONS: {rehab planned interventions:25118::"Therapeutic exercises","Therapeutic activity","Neuromuscular re-education","Balance training","Gait training","Patient/Family education","Self Care","Joint mobilization"}  PLAN FOR NEXT SESSION: ***   Kipling Graser, PT 10/01/2022, 8:07 AM

## 2022-10-07 NOTE — Therapy (Deleted)
OUTPATIENT PHYSICAL THERAPY FEMALE PELVIC EVALUATION   Patient Name: Heidi Estes MRN: [id number] DOB:1995-12-20, 27 y.o., female Today's Date: 10/07/2022  END OF SESSION:   Past Medical History:  Diagnosis Date   Multiple allergies    Postpartum depression    STD (female)    Past Surgical History:  Procedure Laterality Date   COSMETIC SURGERY N/A    tummy tuck   WISDOM TOOTH EXTRACTION Bilateral 07/2013   x4   Patient Active Problem List   Diagnosis Date Noted   ASCUS with positive high risk HPV cervical 08/23/2022   Post term pregnancy, 41 weeks 03/04/2015   Obesity affecting pregnancy in third trimester, antepartum     PCP: none  REFERRING PROVIDER: Ndulue, Nadene Rubins, MD   REFERRING DIAG: M62.89 (ICD-10-CM) - Pelvic floor dysfunction   THERAPY DIAG:  No diagnosis found.  Rationale for Evaluation and Treatment: Rehabilitation  ONSET DATE: ***  SUBJECTIVE:                                                                                                                                                                                           SUBJECTIVE STATEMENT: vaginal irritation. Has also noticed decreased orgasm since having children  Fluid intake: {Yes/No:304960894}   PAIN:  Are you having pain? {yes/no:20286} NPRS scale: ***/10 Pain location: {pelvic pain location:27098}  Pain type: {type:313116} Pain description: {PAIN DESCRIPTION:21022940}   Aggravating factors: *** Relieving factors: ***  PRECAUTIONS: {Therapy precautions:24002}  WEIGHT BEARING RESTRICTIONS: {Yes ***/No:24003}  FALLS:  Has patient fallen in last 6 months? {fallsyesno:27318}  LIVING ENVIRONMENT: Lives with: {[facility] lives with:25569::"lives with their family"} Lives in: {Lives in:25570} Stairs: {opstairs:27293} Has following equipment at home: {Assistive devices:23999}  OCCUPATION: ***  PLOF: {PLOF:24004}  PATIENT GOALS: ***  PERTINENT HISTORY:  STD Sexual  abuse: {Yes/No:304960894}  BOWEL MOVEMENT: Pain with bowel movement: {yes/no:20286} Type of bowel movement:{PT BM type:27100} Fully empty rectum: {Yes/No:304960894} Leakage: {Yes/No:304960894} Pads: {Yes/No:304960894} Fiber supplement: {Yes/No:304960894}  URINATION: Pain with urination: {yes/no:20286} Fully empty bladder: {Yes/No:304960894} Stream: {PT urination:27102} Urgency: {Yes/No:304960894} Frequency: *** Leakage: {PT leakage:27103} Pads: {Yes/No:304960894}  INTERCOURSE: Pain with intercourse: {pain with intercourse PA:27099} Ability to have vaginal penetration:  {Yes/No:304960894} Climax: *** Marinoff Scale: ***/3  PREGNANCY: Vaginal deliveries *** Tearing {Yes***/No:304960894} C-section deliveries *** Currently pregnant {Yes***/No:304960894}  PROLAPSE: {PT prolapse:27101}   OBJECTIVE:   DIAGNOSTIC FINDINGS:  ***  PATIENT SURVEYS:  {rehab surveys:24030}  PFIQ-7 ***  COGNITION: Overall cognitive status: {cognition:24006}     SENSATION: Light touch: {intact/deficits:24005} Proprioception: {intact/deficits:24005}  MUSCLE LENGTH: Hamstrings: Right *** deg; Left *** deg Thomas test: Right *** deg; Left *** deg  LUMBAR SPECIAL TESTS:  {  lumbar special test:25242}  FUNCTIONAL TESTS:  {Functional tests:24029}  GAIT: Distance walked: *** Assistive device utilized: {Assistive devices:23999} Level of assistance: {Levels of assistance:24026} Comments: ***  POSTURE: {posture:25561}  PELVIC ALIGNMENT:  LUMBARAROM/PROM:  A/PROM A/PROM  eval  Flexion   Extension   Right lateral flexion   Left lateral flexion   Right rotation   Left rotation    (Blank rows = not tested)  LOWER EXTREMITY ROM:  {AROM/PROM:27142} ROM Right eval Left eval  Hip flexion    Hip extension    Hip abduction    Hip adduction    Hip internal rotation    Hip external rotation    Knee flexion    Knee extension    Ankle dorsiflexion    Ankle plantarflexion     Ankle inversion    Ankle eversion     (Blank rows = not tested)  LOWER EXTREMITY MMT:  MMT Right eval Left eval  Hip flexion    Hip extension    Hip abduction    Hip adduction    Hip internal rotation    Hip external rotation    Knee flexion    Knee extension    Ankle dorsiflexion    Ankle plantarflexion    Ankle inversion    Ankle eversion     PALPATION:   General  ***                External Perineal Exam ***                             Internal Pelvic Floor ***  Patient confirms identification and approves PT to assess internal pelvic floor and treatment {yes/no:20286}  PELVIC MMT:   MMT eval  Vaginal   Internal Anal Sphincter   External Anal Sphincter   Puborectalis   Diastasis Recti   (Blank rows = not tested)        TONE: ***  PROLAPSE: ***  TODAY'S TREATMENT:                                                                                                                              DATE: ***  EVAL ***   PATIENT EDUCATION:  Education details: *** Person educated: {Person educated:25204} Education method: {Education Method:25205} Education comprehension: {Education Comprehension:25206}  HOME EXERCISE PROGRAM: ***  ASSESSMENT:  CLINICAL IMPRESSION: Patient is a *** y.o. *** who was seen today for physical therapy evaluation and treatment for ***.   OBJECTIVE IMPAIRMENTS: {opptimpairments:25111}.   ACTIVITY LIMITATIONS: {activitylimitations:27494}  PARTICIPATION LIMITATIONS: {participationrestrictions:25113}  PERSONAL FACTORS: {Personal factors:25162} are also affecting patient's functional outcome.   REHAB POTENTIAL: {rehabpotential:25112}  CLINICAL DECISION MAKING: {clinical decision making:25114}  EVALUATION COMPLEXITY: {Evaluation complexity:25115}   GOALS: Goals reviewed with patient? {yes/no:20286}  SHORT TERM GOALS: Target date: ***  *** Baseline: Goal status: {GOALSTATUS:25110}  2.  *** Baseline:  Goal status:  {GOALSTATUS:25110}  3.  ***  Baseline:  Goal status: {GOALSTATUS:25110}  4.  *** Baseline:  Goal status: {GOALSTATUS:25110}  5.  *** Baseline:  Goal status: {GOALSTATUS:25110}  6.  *** Baseline:  Goal status: {GOALSTATUS:25110}  LONG TERM GOALS: Target date: ***  *** Baseline:  Goal status: {GOALSTATUS:25110}  2.  *** Baseline:  Goal status: {GOALSTATUS:25110}  3.  *** Baseline:  Goal status: {GOALSTATUS:25110}  4.  *** Baseline:  Goal status: {GOALSTATUS:25110}  5.  *** Baseline:  Goal status: {GOALSTATUS:25110}  6.  *** Baseline:  Goal status: {GOALSTATUS:25110}  PLAN:  PT FREQUENCY: {rehab frequency:25116}  PT DURATION: {rehab duration:25117}  PLANNED INTERVENTIONS: {rehab planned interventions:25118::"Therapeutic exercises","Therapeutic activity","Neuromuscular re-education","Balance training","Gait training","Patient/Family education","Self Care","Joint mobilization"}  PLAN FOR NEXT SESSION: ***   Lexxie Winberg, PT 10/07/2022, 3:10 PM

## 2022-10-08 ENCOUNTER — Encounter: Payer: Medicaid Other | Attending: Family Medicine | Admitting: Physical Therapy

## 2022-10-10 ENCOUNTER — Encounter: Payer: Medicaid Other | Admitting: Obstetrics and Gynecology

## 2022-10-15 ENCOUNTER — Encounter: Payer: Medicaid Other | Admitting: Physical Therapy

## 2022-10-22 ENCOUNTER — Encounter: Payer: Medicaid Other | Admitting: Physical Therapy

## 2022-10-29 ENCOUNTER — Encounter: Payer: Medicaid Other | Admitting: Physical Therapy

## 2022-11-14 ENCOUNTER — Encounter: Payer: Medicaid Other | Admitting: Obstetrics & Gynecology

## 2022-11-25 ENCOUNTER — Ambulatory Visit: Payer: Medicaid Other

## 2022-12-16 ENCOUNTER — Ambulatory Visit (INDEPENDENT_AMBULATORY_CARE_PROVIDER_SITE_OTHER): Payer: Medicaid Other | Admitting: *Deleted

## 2022-12-16 DIAGNOSIS — Z124 Encounter for screening for malignant neoplasm of cervix: Secondary | ICD-10-CM

## 2022-12-16 DIAGNOSIS — Z23 Encounter for immunization: Secondary | ICD-10-CM | POA: Diagnosis not present

## 2022-12-16 NOTE — Progress Notes (Signed)
Pt presents for Gardasil #1 injection. VIS provided. Injection tolerated well. Pt to return for colposcopy 01/17/23.

## 2022-12-30 ENCOUNTER — Encounter: Payer: Medicaid Other | Admitting: Obstetrics & Gynecology

## 2023-01-17 ENCOUNTER — Encounter: Payer: Medicaid Other | Admitting: Obstetrics & Gynecology

## 2023-02-05 ENCOUNTER — Emergency Department (HOSPITAL_COMMUNITY)
Admission: EM | Admit: 2023-02-05 | Discharge: 2023-02-06 | Discharge status: Against Medical Advice | Payer: Medicaid Other | Attending: Emergency Medicine | Admitting: Emergency Medicine

## 2023-02-05 ENCOUNTER — Other Ambulatory Visit: Payer: Self-pay

## 2023-02-05 ENCOUNTER — Encounter (HOSPITAL_COMMUNITY): Payer: Self-pay | Admitting: Emergency Medicine

## 2023-02-05 DIAGNOSIS — Z5321 Procedure and treatment not carried out due to patient leaving prior to being seen by health care provider: Secondary | ICD-10-CM | POA: Diagnosis not present

## 2023-02-05 DIAGNOSIS — M791 Myalgia, unspecified site: Secondary | ICD-10-CM | POA: Diagnosis not present

## 2023-02-05 DIAGNOSIS — Z1152 Encounter for screening for COVID-19: Secondary | ICD-10-CM | POA: Diagnosis not present

## 2023-02-05 DIAGNOSIS — R519 Headache, unspecified: Secondary | ICD-10-CM | POA: Diagnosis not present

## 2023-02-05 DIAGNOSIS — R112 Nausea with vomiting, unspecified: Secondary | ICD-10-CM | POA: Insufficient documentation

## 2023-02-05 DIAGNOSIS — R109 Unspecified abdominal pain: Secondary | ICD-10-CM | POA: Insufficient documentation

## 2023-02-05 LAB — CBC
HCT: 38.7 % (ref 36.0–46.0)
Hemoglobin: 13.3 g/dL (ref 12.0–15.0)
MCH: 31 pg (ref 26.0–34.0)
MCHC: 34.4 g/dL (ref 30.0–36.0)
MCV: 90.2 fL (ref 80.0–100.0)
Platelets: 242 10*3/uL (ref 150–400)
RBC: 4.29 MIL/uL (ref 3.87–5.11)
RDW: 12.8 % (ref 11.5–15.5)
WBC: 6.2 10*3/uL (ref 4.0–10.5)
nRBC: 0 % (ref 0.0–0.2)

## 2023-02-05 MED ORDER — ONDANSETRON 4 MG PO TBDP
4.0000 mg | ORAL_TABLET | Freq: Once | ORAL | Status: AC | PRN
  Administered 2023-02-05: 4 mg via ORAL
  Filled 2023-02-05: qty 1

## 2023-02-05 NOTE — ED Triage Notes (Signed)
Pt c/o mid abdominal pain nausea, vomiting, body aches and headache x 2 days. Sts family with similar s/s. Pt also concerns for burning with urination.

## 2023-02-06 LAB — URINALYSIS, ROUTINE W REFLEX MICROSCOPIC
Bilirubin Urine: NEGATIVE
Glucose, UA: NEGATIVE mg/dL
Hgb urine dipstick: NEGATIVE
Ketones, ur: NEGATIVE mg/dL
Nitrite: NEGATIVE
Protein, ur: NEGATIVE mg/dL
Specific Gravity, Urine: 1.02 (ref 1.005–1.030)
pH: 6 (ref 5.0–8.0)

## 2023-02-06 LAB — COMPREHENSIVE METABOLIC PANEL
ALT: 15 U/L (ref 0–44)
AST: 14 U/L — ABNORMAL LOW (ref 15–41)
Albumin: 3.8 g/dL (ref 3.5–5.0)
Alkaline Phosphatase: 42 U/L (ref 38–126)
Anion gap: 9 (ref 5–15)
BUN: 13 mg/dL (ref 6–20)
CO2: 22 mmol/L (ref 22–32)
Calcium: 9.1 mg/dL (ref 8.9–10.3)
Chloride: 104 mmol/L (ref 98–111)
Creatinine, Ser: 0.91 mg/dL (ref 0.44–1.00)
GFR, Estimated: >60 mL/min (ref >60)
Glucose, Bld: 85 mg/dL (ref 70–99)
Potassium: 3.8 mmol/L (ref 3.5–5.1)
Sodium: 135 mmol/L (ref 135–145)
Total Bilirubin: 0.5 mg/dL (ref 0.3–1.2)
Total Protein: 7.7 g/dL (ref 6.5–8.1)

## 2023-02-06 LAB — HCG, SERUM, QUALITATIVE: Preg, Serum: NEGATIVE

## 2023-02-06 LAB — SARS CORONAVIRUS 2 BY RT PCR: SARS Coronavirus 2 by RT PCR: NEGATIVE

## 2023-02-06 LAB — LIPASE, BLOOD: Lipase: 32 U/L (ref 11–51)

## 2023-02-06 NOTE — ED Notes (Signed)
Called  pt. Pt not in lobby or bathroom.

## 2023-02-08 ENCOUNTER — Other Ambulatory Visit: Payer: Self-pay

## 2023-02-08 ENCOUNTER — Encounter (HOSPITAL_COMMUNITY): Payer: Self-pay | Admitting: *Deleted

## 2023-02-08 ENCOUNTER — Emergency Department (HOSPITAL_COMMUNITY)
Admission: EM | Admit: 2023-02-08 | Discharge: 2023-02-08 | Disposition: A | Discharge status: Home / Self Care | Payer: Medicaid Other | Attending: Emergency Medicine | Admitting: Emergency Medicine

## 2023-02-08 DIAGNOSIS — R102 Pelvic and perineal pain: Secondary | ICD-10-CM

## 2023-02-08 DIAGNOSIS — N760 Acute vaginitis: Secondary | ICD-10-CM | POA: Insufficient documentation

## 2023-02-08 DIAGNOSIS — R1030 Lower abdominal pain, unspecified: Secondary | ICD-10-CM | POA: Diagnosis present

## 2023-02-08 DIAGNOSIS — B9689 Other specified bacterial agents as the cause of diseases classified elsewhere: Secondary | ICD-10-CM

## 2023-02-08 LAB — URINALYSIS, ROUTINE W REFLEX MICROSCOPIC
Bilirubin Urine: NEGATIVE
Glucose, UA: NEGATIVE mg/dL
Hgb urine dipstick: NEGATIVE
Ketones, ur: NEGATIVE mg/dL
Nitrite: NEGATIVE
Protein, ur: NEGATIVE mg/dL
Specific Gravity, Urine: 1.027 (ref 1.005–1.030)
pH: 6 (ref 5.0–8.0)

## 2023-02-08 LAB — COMPREHENSIVE METABOLIC PANEL
ALT: 15 U/L (ref 0–44)
AST: 15 U/L (ref 15–41)
Albumin: 3.8 g/dL (ref 3.5–5.0)
Alkaline Phosphatase: 44 U/L (ref 38–126)
Anion gap: 6 (ref 5–15)
BUN: 11 mg/dL (ref 6–20)
CO2: 24 mmol/L (ref 22–32)
Calcium: 9 mg/dL (ref 8.9–10.3)
Chloride: 107 mmol/L (ref 98–111)
Creatinine, Ser: 0.72 mg/dL (ref 0.44–1.00)
GFR, Estimated: >60 mL/min (ref >60)
Glucose, Bld: 97 mg/dL (ref 70–99)
Potassium: 3.6 mmol/L (ref 3.5–5.1)
Sodium: 137 mmol/L (ref 135–145)
Total Bilirubin: 0.4 mg/dL (ref 0.3–1.2)
Total Protein: 7.2 g/dL (ref 6.5–8.1)

## 2023-02-08 LAB — LIPASE, BLOOD: Lipase: 32 U/L (ref 11–51)

## 2023-02-08 LAB — CBC
HCT: 38.5 % (ref 36.0–46.0)
Hemoglobin: 12.8 g/dL (ref 12.0–15.0)
MCH: 30.5 pg (ref 26.0–34.0)
MCHC: 33.2 g/dL (ref 30.0–36.0)
MCV: 91.9 fL (ref 80.0–100.0)
Platelets: 234 10*3/uL (ref 150–400)
RBC: 4.19 MIL/uL (ref 3.87–5.11)
RDW: 12.9 % (ref 11.5–15.5)
WBC: 6.2 10*3/uL (ref 4.0–10.5)
nRBC: 0 % (ref 0.0–0.2)

## 2023-02-08 LAB — WET PREP, GENITAL
Sperm: NONE SEEN
Trich, Wet Prep: NONE SEEN
WBC, Wet Prep HPF POC: >=10 — AB (ref <10)
Yeast Wet Prep HPF POC: NONE SEEN

## 2023-02-08 LAB — RPR: RPR Ser Ql: NONREACTIVE

## 2023-02-08 LAB — HIV ANTIBODY (ROUTINE TESTING W REFLEX): HIV Screen 4th Generation wRfx: NONREACTIVE

## 2023-02-08 LAB — HCG, SERUM, QUALITATIVE: Preg, Serum: NEGATIVE

## 2023-02-08 MED ORDER — METRONIDAZOLE 500 MG PO TABS: 500.0000 mg | ORAL_TABLET | Freq: Two times a day (BID) | ORAL | 0 refills | Status: DC

## 2023-02-08 MED ORDER — METRONIDAZOLE 500 MG PO TABS
500.0000 mg | ORAL_TABLET | Freq: Once | ORAL | Status: AC
  Administered 2023-02-08: 500 mg via ORAL
  Filled 2023-02-08: qty 1

## 2023-02-08 NOTE — ED Triage Notes (Signed)
Pt reporting lower abdominal pain "cramping" "comes and goes" "for 2 months" but pain has become worse. Burning sensation with urinating. Brownish vaginal discharge. Lower back pain. Headache. Denies fevers.

## 2023-02-08 NOTE — Discharge Instructions (Addendum)
Take acetaminophen and/or ibuprofen as needed for pain.  Please note that if you combine ibuprofen and acetaminophen, you will get better pain relief and you get from taking either medication by itself.  Return if pain is getting worse, you start running a fever, or start vomiting.  Please check MyChart for the results of your other tests.

## 2023-02-08 NOTE — ED Provider Notes (Signed)
[place] EMERGENCY DEPARTMENT AT [facility] Provider Note   CSN: [id number] Arrival date & time: 02/08/23  0010     History  Chief Complaint  Patient presents with   Abdominal Pain    Heidi Estes is a 27 y.o. female.  The history is provided by the patient.  Abdominal Pain She complains of lower abdominal pain for the last week which got worse yesterday.  Pain is crampy and without radiation.  There has been some nausea but no vomiting.  She is also concerned about a UTI stating that she has urinary urgency and frequency as well as tenesmus.  She also has noted a malodorous vaginal discharge.  She denies fever, chills, sweats.  She denies constipation or diarrhea.  She does admit to having had unprotected sex.  Last menstrual period was September 15 and was slightly heavier than normal.  She has been taking ibuprofen which gives temporary relief.   Home Medications Prior to Admission medications   Medication Sig Start Date End Date Taking? Authorizing Provider  terconazole (TERAZOL 7) 0.4 % vaginal cream Place 1 applicator vaginally at bedtime. Use for 7 days Patient not taking: Reported on 08/23/2022 08/14/22   Ndulue, Chiagoziem J, MD  ARIPiprazole (ABILIFY) 5 MG tablet Take 5 mg by mouth at bedtime. Patient not taking: Reported on 02/08/2023 12/17/22   [provider]  atomoxetine (STRATTERA) 40 MG capsule Take 40 mg by mouth daily. Patient not taking: Reported on 02/08/2023 12/16/22   [provider]  fluticasone (FLONASE) 50 MCG/ACT nasal spray Place 2 sprays into both nostrils in the morning and at bedtime for 15 days. 06/30/21 07/15/21  Edwin Dada P, DO  lidocaine (HM LIDOCAINE PATCH) 4 % Place 1 patch onto the skin daily. Patient not taking: Reported on 08/12/2022 05/21/22   Lonell Grandchild, MD  LORazepam (ATIVAN) 1 MG tablet Take 1 tablet (1 mg total) by mouth once as needed for up to 1 dose for anxiety (Prior to colposcopy procedure). Patient  not taking: Reported on 02/08/2023 08/23/22   Myrtie Hawk, DO  norgestimate-ethinyl estradiol (ORTHO-CYCLEN) 0.25-35 MG-MCG tablet Take 1 tablet by mouth daily. Patient not taking: Reported on 02/08/2023 08/12/22   Ndulue, Nadene Rubins, MD  valACYclovir (VALTREX) 500 MG tablet Take 500 mg by mouth. Patient not taking: Reported on 02/08/2023 07/11/20   [provider]      Allergies    Nitrofurantoin monohyd macro, Macrobid [nitrofurantoin monohyd macro], and Other    Review of Systems   Review of Systems  Gastrointestinal:  Positive for abdominal pain.  All other systems reviewed and are negative.   Physical Exam Updated Vital Signs BP 132/84 (BP Location: Left Arm)   Pulse 88   Temp (!) 97.5 F (36.4 C) (Oral)   Resp 18   LMP 01/21/2023   SpO2 100%  Physical Exam Vitals and nursing note reviewed.   27 year old female, resting comfortably and in no acute distress. Vital signs are normal. Oxygen saturation is 100%, which is normal. Head is normocephalic and atraumatic. PERRLA, EOMI.  Back is nontender and there is no CVA tenderness. Lungs are clear without rales, wheezes, or rhonchi. Chest is nontender. Heart has regular rate and rhythm without murmur. Abdomen is soft, flat, with mild suprapubic tenderness. Pelvic: Normal external female genitalia.  Small amount of thin, white vaginal discharge present.  Moderate amount of mucus coming from the cervix.  Cervix is closed.  Fundus is normal size and position.  There are no adnexal masses or tenderness. Skin is warm and dry without rash. Neurologic: Mental status is normal, cranial nerves are intact, moves all extremities equally.  ED Results / Procedures / Treatments   Labs (all labs ordered are listed, but only abnormal results are displayed) Labs Reviewed  URINALYSIS, ROUTINE W REFLEX MICROSCOPIC - Abnormal; Notable for the following components:      Result Value   Leukocytes,Ua SMALL (*)    Bacteria, UA  RARE (*)    All other components within normal limits  LIPASE, BLOOD  COMPREHENSIVE METABOLIC PANEL  CBC  HCG, SERUM, QUALITATIVE    EKG None  Radiology No results found.  Procedures Procedures    Medications Ordered in ED Medications  metroNIDAZOLE (FLAGYL) tablet 500 mg (has no administration in time range)    ED Course/ Medical Decision Making/ A&P                                 Medical Decision Making Amount and/or Complexity of Data Reviewed Labs: ordered.  Risk Prescription drug management.   Pelvic pain.  Differential diagnosis includes, but is not limited to, urinary tract infection, pelvic inflammatory disease, ovarian cyst, irritable bowel syndrome, diverticulitis, nonspecific pelvic pain.  I have reviewed her laboratory tests, and my interpretation is no evidence of UTI, normal CBC, normal comprehensive metabolic panel.  Wet prep is positive for clue cells.  I have ordered a dose of metronidazole and I am discharging her with a prescription for metronidazole.  Patient advised that she will be contacted if any of her sexually-transmitted infection tests are positive but also advised her to look for the results on MyChart.  She is to use over-the-counter NSAIDs and acetaminophen as needed for pain.  Follow-up with gynecology.  Return precautions discussed.  Final Clinical Impression(s) / ED Diagnoses Final diagnoses:  Acute pelvic pain, female  Bacterial vaginosis    Rx / DC Orders ED Discharge Orders          Ordered    metroNIDAZOLE (FLAGYL) 500 MG tablet  2 times daily        02/08/23 0646              Dione Booze, MD 02/08/23 [phone]

## 2023-02-10 LAB — GC/CHLAMYDIA PROBE AMP (~~LOC~~) NOT AT ARMC
Chlamydia: NEGATIVE
Comment: NEGATIVE
Comment: NORMAL
Neisseria Gonorrhea: NEGATIVE

## 2023-02-12 ENCOUNTER — Other Ambulatory Visit (HOSPITAL_COMMUNITY)
Admission: RE | Admit: 2023-02-12 | Discharge: 2023-02-12 | Disposition: A | Discharge status: Home / Self Care | Payer: Medicaid Other | Source: Ambulatory Visit | Attending: Obstetrics and Gynecology | Admitting: Obstetrics and Gynecology

## 2023-02-12 ENCOUNTER — Encounter: Payer: Self-pay | Admitting: Obstetrics & Gynecology

## 2023-02-12 ENCOUNTER — Ambulatory Visit (INDEPENDENT_AMBULATORY_CARE_PROVIDER_SITE_OTHER): Payer: Medicaid Other | Admitting: Obstetrics & Gynecology

## 2023-02-12 VITALS — BP 120/86 | HR 86 | Ht 62.0 in | Wt 208.0 lb

## 2023-02-12 DIAGNOSIS — R87610 Atypical squamous cells of undetermined significance on cytologic smear of cervix (ASC-US): Secondary | ICD-10-CM | POA: Diagnosis present

## 2023-02-12 DIAGNOSIS — R87810 Cervical high risk human papillomavirus (HPV) DNA test positive: Secondary | ICD-10-CM

## 2023-02-12 NOTE — Progress Notes (Signed)
27 y.o. GYN presents for COLPO. PAP is +High risk HPV and ASCUS.  UPT today is Negative.

## 2023-02-12 NOTE — Progress Notes (Signed)
Patient ID: Heidi Estes, female   DOB: 1996-04-22, 27 y.o.   MRN: [id number]  Chief Complaint  Patient presents with   Colposcopy    HPI Heidi Estes is a 27 y.o. female.  [id number] Patient's last menstrual period was 01/21/2023. Pap 07/2022 HPI  Indications: Pap smear on March 2024 showed: ASCUS with POSITIVE high risk HPV. Previous colposcopy: no. Prior cervical treatment: no treatment.  Past Medical History:  Diagnosis Date   Multiple allergies    Postpartum depression    STD (female)     Past Surgical History:  Procedure Laterality Date   COSMETIC SURGERY N/A    tummy tuck   WISDOM TOOTH EXTRACTION Bilateral 07/2013   x4    Family History  Problem Relation Age of Onset   Asthma Mother    Hypertension Mother    Hypertension Maternal Aunt     Social History Social History   Tobacco Use   Smoking status: Every Day    Types: Cigars   Smokeless tobacco: Never  Vaping Use   Vaping status: Never Used  Substance Use Topics   Alcohol use: Yes    Comment: Occassional    Drug use: No    Allergies  Allergen Reactions   Nitrofurantoin Monohyd Macro Nausea And Vomiting   Macrobid [Nitrofurantoin Monohyd Macro] Nausea And Vomiting   Other Nausea And Vomiting    Current Outpatient Medications  Medication Sig Dispense Refill   terconazole (TERAZOL 7) 0.4 % vaginal cream Place 1 applicator vaginally at bedtime. Use for 7 days (Patient not taking: Reported on 08/23/2022) 45 g 0   ARIPiprazole (ABILIFY) 5 MG tablet Take 5 mg by mouth at bedtime. (Patient not taking: Reported on 02/08/2023)     atomoxetine (STRATTERA) 40 MG capsule Take 40 mg by mouth daily. (Patient not taking: Reported on 02/08/2023)     fluticasone (FLONASE) 50 MCG/ACT nasal spray Place 2 sprays into both nostrils in the morning and at bedtime for 15 days. 1 g 0   lidocaine (HM LIDOCAINE PATCH) 4 % Place 1 patch onto the skin daily. (Patient not taking: Reported on 08/12/2022) 30 patch 0   LORazepam  (ATIVAN) 1 MG tablet Take 1 tablet (1 mg total) by mouth once as needed for up to 1 dose for anxiety (Prior to colposcopy procedure). (Patient not taking: Reported on 02/08/2023) 30 tablet 0   metroNIDAZOLE (FLAGYL) 500 MG tablet Take 1 tablet (500 mg total) by mouth 2 (two) times daily. 14 tablet 0   norgestimate-ethinyl estradiol (ORTHO-CYCLEN) 0.25-35 MG-MCG tablet Take 1 tablet by mouth daily. (Patient not taking: Reported on 02/08/2023) 28 tablet 11   No current facility-administered medications for this visit.    Review of Systems Review of Systems  All other systems reviewed and are negative.   Blood pressure 120/86, pulse 86, height 5\' 2"  (1.575 m), weight 208 lb (94.3 kg), last menstrual period 01/21/2023, unknown if currently breastfeeding.  Physical Exam Physical Exam Vitals and nursing note reviewed. Exam conducted with a chaperone present.  Genitourinary:    General: Normal vulva.     Exam position: Lithotomy position.     Vagina: Normal.     Cervix: Normal.   Patient given informed consent, signed copy in the chart, time out was performed.  Placed in lithotomy position. Cervix viewed with speculum and colposcope after application of acetic acid.   Colposcopy adequate?  yes Acetowhite lesions?no Punctation?no Mosaicism?  no Abnormal vasculature?  no Biopsies?6 and 12 ECC?yes  COMMENTS:  Patient was given post procedure instructions.    Data Reviewed Pap result  Assessment    Procedure Details  The risks and benefits of the procedure and Written informed consent obtained.  Speculum placed in vagina and excellent visualization of cervix achieved, cervix swabbed x 3 with acetic acid solution.  Specimens: Bx x 2 and ECC  Complications: none.     Plan    Specimens labelled and sent to Pathology. Return to discuss Pathology results in 2 weeks.      Scheryl Darter 02/12/2023, 3:04 PM

## 2023-02-14 ENCOUNTER — Ambulatory Visit (INDEPENDENT_AMBULATORY_CARE_PROVIDER_SITE_OTHER): Payer: Medicaid Other | Admitting: General Practice

## 2023-02-14 VITALS — BP 129/93 | HR 75 | Ht 62.0 in | Wt 208.2 lb

## 2023-02-14 DIAGNOSIS — Z23 Encounter for immunization: Secondary | ICD-10-CM | POA: Diagnosis not present

## 2023-02-14 NOTE — Progress Notes (Signed)
Pt presents for 2nd Gardasil injection.  First injection given 12-16-22.  Given by Hope Pigeon, CMA in the LD per pt request Pt tolerated well  Next Gardasil injection due 05/2023    Immunization History  Administered Date(s) Administered   HPV 9-valent 12/16/2022   Influenza,inj,Quad PF,6+ Mos 03/05/2015

## 2023-02-24 LAB — SURGICAL PATHOLOGY

## 2023-02-24 NOTE — Progress Notes (Signed)
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

## 2023-06-16 ENCOUNTER — Ambulatory Visit: Payer: Medicaid Other

## 2023-06-24 ENCOUNTER — Other Ambulatory Visit: Payer: Self-pay

## 2023-06-24 ENCOUNTER — Emergency Department (HOSPITAL_COMMUNITY)
Admission: EM | Admit: 2023-06-24 | Discharge: 2023-06-25 | Disposition: A | Discharge status: Home / Self Care | Payer: Medicaid Other | Attending: Emergency Medicine | Admitting: Emergency Medicine

## 2023-06-24 DIAGNOSIS — N3090 Cystitis, unspecified without hematuria: Secondary | ICD-10-CM | POA: Insufficient documentation

## 2023-06-24 DIAGNOSIS — R519 Headache, unspecified: Secondary | ICD-10-CM | POA: Diagnosis not present

## 2023-06-24 DIAGNOSIS — B9689 Other specified bacterial agents as the cause of diseases classified elsewhere: Secondary | ICD-10-CM

## 2023-06-24 DIAGNOSIS — R0789 Other chest pain: Secondary | ICD-10-CM | POA: Insufficient documentation

## 2023-06-24 DIAGNOSIS — N760 Acute vaginitis: Secondary | ICD-10-CM | POA: Insufficient documentation

## 2023-06-24 DIAGNOSIS — Z20822 Contact with and (suspected) exposure to covid-19: Secondary | ICD-10-CM | POA: Insufficient documentation

## 2023-06-24 DIAGNOSIS — N898 Other specified noninflammatory disorders of vagina: Secondary | ICD-10-CM | POA: Diagnosis present

## 2023-06-24 LAB — URINALYSIS, ROUTINE W REFLEX MICROSCOPIC
Bilirubin Urine: NEGATIVE
Glucose, UA: NEGATIVE mg/dL
Hgb urine dipstick: NEGATIVE
Ketones, ur: NEGATIVE mg/dL
Nitrite: NEGATIVE
Protein, ur: NEGATIVE mg/dL
Specific Gravity, Urine: 1.03 (ref 1.005–1.030)
pH: 6 (ref 5.0–8.0)

## 2023-06-24 LAB — WET PREP, GENITAL
Sperm: NONE SEEN
Trich, Wet Prep: NONE SEEN
WBC, Wet Prep HPF POC: <10 (ref <10)
Yeast Wet Prep HPF POC: NONE SEEN

## 2023-06-24 LAB — CBC WITH DIFFERENTIAL/PLATELET
Abs Immature Granulocytes: 0.01 10*3/uL (ref 0.00–0.07)
Basophils Absolute: 0 10*3/uL (ref 0.0–0.1)
Basophils Relative: 1 %
Eosinophils Absolute: 0.1 10*3/uL (ref 0.0–0.5)
Eosinophils Relative: 3 %
HCT: 41.8 % (ref 36.0–46.0)
Hemoglobin: 13.7 g/dL (ref 12.0–15.0)
Immature Granulocytes: 0 %
Lymphocytes Relative: 41 %
Lymphs Abs: 1.9 10*3/uL (ref 0.7–4.0)
MCH: 30 pg (ref 26.0–34.0)
MCHC: 32.8 g/dL (ref 30.0–36.0)
MCV: 91.5 fL (ref 80.0–100.0)
Monocytes Absolute: 0.4 10*3/uL (ref 0.1–1.0)
Monocytes Relative: 9 %
Neutro Abs: 2.2 10*3/uL (ref 1.7–7.7)
Neutrophils Relative %: 46 %
Platelets: 227 10*3/uL (ref 150–400)
RBC: 4.57 MIL/uL (ref 3.87–5.11)
RDW: 12.3 % (ref 11.5–15.5)
WBC: 4.7 10*3/uL (ref 4.0–10.5)
nRBC: 0 % (ref 0.0–0.2)

## 2023-06-24 LAB — RAPID HIV SCREEN (HIV 1/2 AB+AG)
HIV 1/2 Antibodies: NONREACTIVE
HIV-1 P24 Antigen - HIV24: NONREACTIVE

## 2023-06-24 LAB — BASIC METABOLIC PANEL
Anion gap: 10 (ref 5–15)
BUN: 9 mg/dL (ref 6–20)
CO2: 22 mmol/L (ref 22–32)
Calcium: 9.4 mg/dL (ref 8.9–10.3)
Chloride: 106 mmol/L (ref 98–111)
Creatinine, Ser: 0.54 mg/dL (ref 0.44–1.00)
GFR, Estimated: >60 mL/min (ref >60)
Glucose, Bld: 121 mg/dL — ABNORMAL HIGH (ref 70–99)
Potassium: 4 mmol/L (ref 3.5–5.1)
Sodium: 138 mmol/L (ref 135–145)

## 2023-06-24 LAB — RESP PANEL BY RT-PCR (RSV, FLU A&B, COVID)  RVPGX2
Influenza A by PCR: NEGATIVE
Influenza B by PCR: NEGATIVE
Resp Syncytial Virus by PCR: NEGATIVE
SARS Coronavirus 2 by RT PCR: NEGATIVE

## 2023-06-24 MED ORDER — METOCLOPRAMIDE HCL 5 MG/ML IJ SOLN
10.0000 mg | Freq: Once | INTRAMUSCULAR | Status: AC
  Administered 2023-06-25: 10 mg via INTRAVENOUS
  Filled 2023-06-24: qty 2

## 2023-06-24 MED ORDER — DIPHENHYDRAMINE HCL 50 MG/ML IJ SOLN
25.0000 mg | Freq: Once | INTRAMUSCULAR | Status: AC
  Administered 2023-06-25: 25 mg via INTRAVENOUS
  Filled 2023-06-24: qty 1

## 2023-06-24 MED ORDER — KETOROLAC TROMETHAMINE 30 MG/ML IJ SOLN
15.0000 mg | Freq: Once | INTRAMUSCULAR | Status: AC
  Administered 2023-06-25: 15 mg via INTRAVENOUS
  Filled 2023-06-24: qty 1

## 2023-06-24 MED ORDER — SODIUM CHLORIDE 0.9 % IV SOLN
1.0000 g | Freq: Once | INTRAVENOUS | Status: AC
  Administered 2023-06-25: 1 g via INTRAVENOUS
  Filled 2023-06-24: qty 10

## 2023-06-24 NOTE — ED Triage Notes (Signed)
Pt arrived with not feeling well for a couple days with vomiting, fever,  also having vaginal burning with  brown discharge for the past 2 weeks after having sex in which the condom broke

## 2023-06-24 NOTE — ED Provider Triage Note (Signed)
 Emergency Medicine Provider Triage Evaluation Note  Heidi Estes , a 28 y.o. female  was evaluated in triage.  Pt complains of cough, congestion x 3 days. Also reports brown discharge x 4 days, noticed after having intercourse (concerned condom broke); since has had vaginal pruritus, R sided abdominal pain, dysuria.   LMP 06/16/2023.  Endorses chills,  headache, intermittent blurry vision when standing up.  Denies fever, vaginal lesions, sore throat.   Review of Systems  Positive: N/a Negative: N/a  Physical Exam  BP (!) 142/98 (BP Location: Left Arm)   Pulse 100   Temp 98.6 F (37 C) (Oral)   Resp 18   LMP 06/16/2023   SpO2 95%  Gen:   Awake, no distress   Resp:  Normal effort  MSK:   Moves extremities without difficulty  Other:    Medical Decision Making  Medically screening exam initiated at 8:21 PM.  Appropriate orders placed.  Heidi Estes was informed that the remainder of the evaluation will be completed by another provider, this initial triage assessment does not replace that evaluation, and the importance of remaining in the ED until their evaluation is complete.     Heidi Estes, [place] 06/24/23 2026

## 2023-06-25 ENCOUNTER — Emergency Department (HOSPITAL_COMMUNITY): Payer: Medicaid Other

## 2023-06-25 LAB — D-DIMER, QUANTITATIVE: D-Dimer, Quant: <0.27 ug{FEU}/mL (ref 0.00–0.50)

## 2023-06-25 LAB — RPR: RPR Ser Ql: NONREACTIVE

## 2023-06-25 LAB — TROPONIN I (HIGH SENSITIVITY): Troponin I (High Sensitivity): <2 ng/L (ref <18)

## 2023-06-25 LAB — PREGNANCY, URINE: Preg Test, Ur: NEGATIVE

## 2023-06-25 MED ORDER — METRONIDAZOLE 500 MG PO TABS: 500.0000 mg | ORAL_TABLET | Freq: Two times a day (BID) | ORAL | 0 refills | Status: AC

## 2023-06-25 MED ORDER — CEPHALEXIN 500 MG PO CAPS: 500.0000 mg | ORAL_CAPSULE | Freq: Three times a day (TID) | ORAL | 0 refills | Status: AC

## 2023-06-25 NOTE — ED Provider Notes (Incomplete)
[facility] EMERGENCY DEPARTMENT AT [facility] Provider Note   CSN: [id number] Arrival date & time: 06/24/23  1959     History {Add pertinent medical, surgical, social history, OB history to HPI:1} Chief Complaint  Patient presents with  . Generalized Body Aches  . Vaginal Discharge    Heidi Estes is a 28 y.o. female.  HPI     Home Medications Prior to Admission medications   Medication Sig Start Date End Date Taking? Authorizing Provider  terconazole (TERAZOL 7) 0.4 % vaginal cream Place 1 applicator vaginally at bedtime. Use for 7 days Patient not taking: Reported on 08/23/2022 08/14/22   Ndulue, Chiagoziem J, MD  ARIPiprazole (ABILIFY) 5 MG tablet Take 5 mg by mouth at bedtime. Patient not taking: Reported on 02/08/2023 12/17/22   [provider]  atomoxetine (STRATTERA) 40 MG capsule Take 40 mg by mouth daily. Patient not taking: Reported on 02/08/2023 12/16/22   [provider]  fluticasone (FLONASE) 50 MCG/ACT nasal spray Place 2 sprays into both nostrils in the morning and at bedtime for 15 days. 06/30/21 07/15/21  Edwin Dada P, DO  lidocaine (HM LIDOCAINE PATCH) 4 % Place 1 patch onto the skin daily. Patient not taking: Reported on 08/12/2022 05/21/22   Lonell Grandchild, MD  LORazepam (ATIVAN) 1 MG tablet Take 1 tablet (1 mg total) by mouth once as needed for up to 1 dose for anxiety (Prior to colposcopy procedure). Patient not taking: Reported on 02/08/2023 08/23/22   Mercado-Ortiz, Lahoma Crocker, DO  metroNIDAZOLE (FLAGYL) 500 MG tablet Take 1 tablet (500 mg total) by mouth 2 (two) times daily. 02/08/23   Dione Booze, MD  norgestimate-ethinyl estradiol (ORTHO-CYCLEN) 0.25-35 MG-MCG tablet Take 1 tablet by mouth daily. Patient not taking: Reported on 02/08/2023 08/12/22   Ndulue, Nadene Rubins, MD      Allergies    Nitrofurantoin monohyd macro, Macrobid [nitrofurantoin monohyd macro], and Other    Review of Systems   Review of Systems  Physical  Exam Updated Vital Signs BP 108/78   Pulse 80   Temp 98.6 F (37 C) (Oral)   Resp 18   LMP 06/16/2023   SpO2 100%  Physical Exam  ED Results / Procedures / Treatments   Labs (all labs ordered are listed, but only abnormal results are displayed) Labs Reviewed  WET PREP, GENITAL - Abnormal; Notable for the following components:      Result Value   Clue Cells Wet Prep HPF POC PRESENT (*)    All other components within normal limits  URINALYSIS, ROUTINE W REFLEX MICROSCOPIC - Abnormal; Notable for the following components:   APPearance HAZY (*)    Leukocytes,Ua MODERATE (*)    Bacteria, UA MANY (*)    All other components within normal limits  BASIC METABOLIC PANEL - Abnormal; Notable for the following components:   Glucose, Bld 121 (*)    All other components within normal limits  RESP PANEL BY RT-PCR (RSV, FLU A&B, COVID)  RVPGX2  CBC WITH DIFFERENTIAL/PLATELET  RAPID HIV SCREEN (HIV 1/2 AB+AG)  RPR  D-DIMER, QUANTITATIVE  URINALYSIS, W/ REFLEX TO CULTURE (INFECTION SUSPECTED)  GC/CHLAMYDIA PROBE AMP ([place]) NOT AT [facility]  TROPONIN I (HIGH SENSITIVITY)    EKG None  Radiology No results found.  Procedures Procedures  {Document cardiac monitor, telemetry assessment procedure when appropriate:1}  Medications Ordered in ED Medications  ketorolac (TORADOL) 30 MG/ML injection 15 mg (has no administration in time range)  metoCLOPramide (REGLAN) injection 10 mg (  has no administration in time range)  diphenhydrAMINE (BENADRYL) injection 25 mg (has no administration in time range)  cefTRIAXone (ROCEPHIN) 1 g in sodium chloride 0.9 % 100 mL IVPB (has no administration in time range)    ED Course/ Medical Decision Making/ A&P   {   Click here for ABCD2, HEART and other calculatorsREFRESH Note before signing :1}                              Medical Decision Making Amount and/or Complexity of Data Reviewed Labs: ordered. Radiology: ordered. ECG/medicine tests:  ordered.  Risk Prescription drug management.   ***  {Document critical care time when appropriate:1} {Document review of labs and clinical decision tools ie heart score, Chads2Vasc2 etc:1}  {Document your independent review of radiology images, and any outside records:1} {Document your discussion with family members, caretakers, and with consultants:1} {Document social determinants of health affecting pt's care:1} {Document your decision making why or why not admission, treatments were needed:1} Final Clinical Impression(s) / ED Diagnoses Final diagnoses:  None    Rx / DC Orders ED Discharge Orders     None

## 2023-06-25 NOTE — Discharge Instructions (Signed)
 No evidence of heart attack or blood clot in the lung.  Take the antibiotic for UTI as well as bacterial vaginosis as prescribed.  Follow-up with your primary doctor or gynecologist.  Return to the ED with new or worsening symptoms.

## 2023-06-25 NOTE — ED Provider Notes (Signed)
 [place] EMERGENCY DEPARTMENT AT [facility] Provider Note   CSN: [id number] Arrival date & time: 06/24/23  1959     History  Chief Complaint  Patient presents with   Generalized Body Aches   Vaginal Discharge    Heidi Estes is a 28 y.o. female.  Patient with no medical history reports multiple complaints.  She reports a right-sided headache, left-sided chest pain, vaginal discharge and dysuria for the past 3 days.  States headache is constant for the past 3 days was gradual in buildup.  Denies thunderclap onset.  Associate with nausea but no vomiting.  No photophobia.  No focal weakness, numbness or tingling.  No visual changes.  At the same time of the headache she developed left-sided chest pain which has been constant since then and feels like she is being punched in the chest.  Some shortness of breath.  No cough or fever.  Denies any cardiac or pulmonary history.  Does take birth control.  No abdominal pain, vomiting, diarrhea, pain with urination but does have vaginal discharge that is brown and burning with urination.  She reports discharge ever since she had sex with a condom breaking about 2 weeks ago.  Multiple family members have had COVID and flu.  Headache is gradual in onset.  Associated with nausea but no visual changes.  No vomiting.  No focal weakness, numbness or tingling.  Left-sided chest pain is been constant for the past 3 days.  The history is provided by the patient.  Vaginal Discharge Associated symptoms: abdominal pain and dysuria   Associated symptoms: no fever, no nausea and no vomiting        Home Medications Prior to Admission medications   Medication Sig Start Date End Date Taking? Authorizing Provider  terconazole  (TERAZOL 7 ) 0.4 % vaginal cream Place 1 applicator vaginally at bedtime. Use for 7 days Patient not taking: Reported on 08/23/2022 08/14/22   Ndulue, Chiagoziem J, MD  ARIPiprazole (ABILIFY) 5 MG tablet Take 5 mg by mouth at  bedtime. Patient not taking: Reported on 02/08/2023 12/17/22   [provider]  atomoxetine (STRATTERA) 40 MG capsule Take 40 mg by mouth daily. Patient not taking: Reported on 02/08/2023 12/16/22   [provider]  fluticasone  (FLONASE ) 50 MCG/ACT nasal spray Place 2 sprays into both nostrils in the morning and at bedtime for 15 days. 06/30/21 07/15/21  Elnor Hila P, DO  lidocaine  (HM LIDOCAINE  PATCH) 4 % Place 1 patch onto the skin daily. Patient not taking: Reported on 08/12/2022 05/21/22   Francesca Elsie CROME, MD  LORazepam  (ATIVAN ) 1 MG tablet Take 1 tablet (1 mg total) by mouth once as needed for up to 1 dose for anxiety (Prior to colposcopy procedure). Patient not taking: Reported on 02/08/2023 08/23/22   Richie Harlene RODES, DO  metroNIDAZOLE  (FLAGYL ) 500 MG tablet Take 1 tablet (500 mg total) by mouth 2 (two) times daily. 02/08/23   Raford Lenis, MD  norgestimate -ethinyl estradiol  (ORTHO-CYCLEN) 0.25-35 MG-MCG tablet Take 1 tablet by mouth daily. Patient not taking: Reported on 02/08/2023 08/12/22   Ndulue, Chiagoziem J, MD      Allergies    Nitrofurantoin  monohyd macro, Macrobid  [nitrofurantoin  monohyd macro], and Other    Review of Systems   Review of Systems  Constitutional:  Positive for fatigue. Negative for activity change, appetite change and fever.  HENT:  Positive for congestion, rhinorrhea and sore throat.   Eyes:  Negative for visual disturbance.  Respiratory:  Positive for cough,  chest tightness and shortness of breath.   Cardiovascular:  Positive for chest pain.  Gastrointestinal:  Positive for abdominal pain. Negative for nausea and vomiting.  Genitourinary:  Positive for dysuria and vaginal discharge. Negative for hematuria.  Musculoskeletal:  Positive for arthralgias and myalgias.  Skin:  Negative for rash.  Neurological:  Positive for weakness and headaches. Negative for dizziness and light-headedness.   all other systems are negative except as noted  in the HPI and PMH.    Physical Exam Updated Vital Signs BP 108/78   Pulse 80   Temp 98.6 F (37 C) (Oral)   Resp 18   LMP 06/16/2023   SpO2 100%  Physical Exam Vitals and nursing note reviewed.  Constitutional:      General: She is not in acute distress.    Appearance: She is well-developed. She is not ill-appearing.  HENT:     Head: Normocephalic and atraumatic.     Right Ear: Tympanic membrane normal.     Left Ear: Tympanic membrane normal.     Mouth/Throat:     Pharynx: No oropharyngeal exudate.  Eyes:     Conjunctiva/sclera: Conjunctivae normal.     Pupils: Pupils are equal, round, and reactive to light.  Neck:     Comments: No meningismus. Cardiovascular:     Rate and Rhythm: Normal rate and regular rhythm.     Heart sounds: Normal heart sounds. No murmur heard. Pulmonary:     Effort: Pulmonary effort is normal. No respiratory distress.     Breath sounds: Normal breath sounds.     Comments: Reproducible left-sided chest pain. Chest:     Chest wall: Tenderness present.  Abdominal:     Palpations: Abdomen is soft.     Tenderness: There is no abdominal tenderness. There is no guarding or rebound.  Musculoskeletal:        General: No tenderness. Normal range of motion.     Cervical back: Normal range of motion and neck supple.  Skin:    General: Skin is warm.  Neurological:     Mental Status: She is alert and oriented to person, place, and time.     Cranial Nerves: No cranial nerve deficit.     Motor: No abnormal muscle tone.     Coordination: Coordination normal.     Comments:  5/5 strength throughout. CN 2-12 intact.Equal grip strength.   Psychiatric:        Behavior: Behavior normal.     ED Results / Procedures / Treatments   Labs (all labs ordered are listed, but only abnormal results are displayed) Labs Reviewed  WET PREP, GENITAL - Abnormal; Notable for the following components:      Result Value   Clue Cells Wet Prep HPF POC PRESENT (*)    All  other components within normal limits  URINALYSIS, ROUTINE W REFLEX MICROSCOPIC - Abnormal; Notable for the following components:   APPearance HAZY (*)    Leukocytes,Ua MODERATE (*)    Bacteria, UA MANY (*)    All other components within normal limits  BASIC METABOLIC PANEL - Abnormal; Notable for the following components:   Glucose, Bld 121 (*)    All other components within normal limits  RESP PANEL BY RT-PCR (RSV, FLU A&B, COVID)  RVPGX2  CBC WITH DIFFERENTIAL/PLATELET  RAPID HIV SCREEN (HIV 1/2 AB+AG)  D-DIMER, QUANTITATIVE  PREGNANCY, URINE  RPR  GC/CHLAMYDIA PROBE AMP ([place]) NOT AT [facility]  TROPONIN I (HIGH SENSITIVITY)  TROPONIN I (HIGH SENSITIVITY)  EKG EKG Interpretation Date/Time:  Wednesday June 25 2023 01:46:55 EST Ventricular Rate:  66 PR Interval:  165 QRS Duration:  92 QT Interval:  434 QTC Calculation: 455 R Axis:   57  Text Interpretation: Sinus rhythm No previous ECGs available Confirmed by Carita Senior [phone]) on 06/25/2023 1:50:54 AM  Radiology CT Head Wo Contrast Result Date: 06/25/2023 CLINICAL DATA:  Sudden severe headache EXAM: CT HEAD WITHOUT CONTRAST TECHNIQUE: Contiguous axial images were obtained from the base of the skull through the vertex without intravenous contrast. RADIATION DOSE REDUCTION: This exam was performed according to the departmental dose-optimization program which includes automated exposure control, adjustment of the mA and/or kV according to patient size and/or use of iterative reconstruction technique. COMPARISON:  None Available. FINDINGS: Brain: No mass,hemorrhage or extra-axial collection. Normal appearance of the parenchyma and CSF spaces. Vascular: No hyperdense vessel or unexpected vascular calcification. Skull: The visualized skull base, calvarium and extracranial soft tissues are normal. Sinuses/Orbits: No fluid levels or advanced mucosal thickening of the visualized paranasal sinuses. No mastoid or middle ear  effusion. Normal orbits. Other: None. IMPRESSION: Normal head CT. Electronically Signed   By: Franky Stanford M.D.   On: 06/25/2023 01:54   DG Chest 2 View Result Date: 06/25/2023 CLINICAL DATA:  Chest pain EXAM: CHEST - 2 VIEW COMPARISON:  None Available. FINDINGS: The heart size and mediastinal contours are within normal limits. Both lungs are clear. The visualized skeletal structures are unremarkable. IMPRESSION: No active cardiopulmonary disease. Electronically Signed   By: Dorethia Molt M.D.   On: 06/25/2023 00:27    Procedures Procedures    Medications Ordered in ED Medications  ketorolac  (TORADOL ) 30 MG/ML injection 15 mg (has no administration in time range)  metoCLOPramide  (REGLAN ) injection 10 mg (has no administration in time range)  diphenhydrAMINE  (BENADRYL ) injection 25 mg (has no administration in time range)  cefTRIAXone  (ROCEPHIN ) 1 g in sodium chloride  0.9 % 100 mL IVPB (has no administration in time range)    ED Course/ Medical Decision Making/ A&P                                 Medical Decision Making Amount and/or Complexity of Data Reviewed Labs: ordered. Decision-making details documented in ED Course. Radiology: ordered and independent interpretation performed. Decision-making details documented in ED Course. ECG/medicine tests: ordered and independent interpretation performed. Decision-making details documented in ED Course.  Risk Prescription drug management.   3 days of gradual onset headache with left-sided chest pain, burning with urination and vaginal discharge.  Stable vitals.  No distress.  No hypoxia or increased work of breathing.  Chest pain is worse with palpation and movement.  Equal breath sounds.  Clear lungs.  Will obtain EKG, chest x-ray.  Headache is gradual in onset.  Low suspicion for thunderclap onset.  Low suspicion for meningitis, temporal arteritis, subarachnoid hemorrhage.  COVID and flu swabs are negative.  hCG is negative.   Urinalysis concerning for infection with pyuria and leukocyte esterase.  Will send for culture.  Will treat empirically for bacterial vaginosis and possible UTI.  Low suspicion for ACS or PE  Chest pain is reproducible to palpation.  EKG is sinus rhythm without acute ST changes.  Chest x-ray negative for infiltrate or pneumothorax or broken rib.  Results reviewed interpreted by me.  Low suspicion for ACS or PE.  Headache has improved with migraine cocktail.  Low suspicion for subarachnoid hemorrhage, meningitis,  temporal arteritis.  Neurological exam is nonfocal.  Will treat for UTI and bacterial vaginosis.  Vitals stable. Tolerating PO. Followup with PCP and GYN. Return to the ED with new or worsening symptoms.         Final Clinical Impression(s) / ED Diagnoses Final diagnoses:  Atypical chest pain  Bacterial vaginosis  Cystitis    Rx / DC Orders ED Discharge Orders     None         Bruna Dills, [place], MD 06/25/23 [phone]

## 2023-06-26 LAB — GC/CHLAMYDIA PROBE AMP (~~LOC~~) NOT AT ARMC
Chlamydia: NEGATIVE
Comment: NEGATIVE
Comment: NORMAL
Neisseria Gonorrhea: POSITIVE — AB

## 2023-09-10 ENCOUNTER — Encounter: Payer: Self-pay | Admitting: Physician Assistant

## 2023-10-17 ENCOUNTER — Encounter: Payer: Self-pay | Admitting: Physician Assistant

## 2023-10-17 ENCOUNTER — Ambulatory Visit: Admitting: Physician Assistant

## 2023-10-17 VITALS — BP 140/80 | HR 88 | Ht 62.0 in | Wt 205.0 lb

## 2023-10-17 DIAGNOSIS — R140 Abdominal distension (gaseous): Secondary | ICD-10-CM | POA: Diagnosis not present

## 2023-10-17 DIAGNOSIS — K5900 Constipation, unspecified: Secondary | ICD-10-CM | POA: Diagnosis not present

## 2023-10-17 MED ORDER — LINACLOTIDE 72 MCG PO CAPS: 72.0000 ug | ORAL_CAPSULE | Freq: Every day | ORAL | 5 refills | Status: AC

## 2023-10-17 NOTE — Progress Notes (Signed)
 Chief Complaint: Bloating and gas  HPI:    Heidi Estes is a 28 year old female with past medical history as listed below including allergies, who presents to clinic today with a complaint of bloating and gas.    06/24/23 patient seen in the ED for atypical chest pain.  At that time noted some right sided headache, left-sided chest pain, vaginal discharge and dysuria.  At that time told to follow-up with gynecologist.  CBC and BMP normal.    Today, patient presents to clinic and explains that she has always had issues with bowel movements, apparently her grandmother used to rub castor oil on her bottom in order for her to have a bowel movement as a baby.  This has continued into her adult life.  She tells me that she has hard stool that is really hard to get out about once or twice a day and oftentimes she has to rock back-and-forth on the toilet.  She tried MiraLAX  once daily for a couple of weeks but it did not really help so she bought a supplement from [place] called "Dr. Johnye Napoleon colon cleanse" and if she takes this it does clean her out but she knows is not safe to use all the time.  Associated symptoms include bloating and gas.    Prior history of a tummy tuck back in 2019.    Denies fever, chills, weight loss or blood in her stool.  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 Use for 7 days (Patient not taking: Reported on 08/23/2022) 45 g 0   ARIPiprazole (ABILIFY) 5 MG tablet Take 5 mg by mouth at bedtime. (Patient not taking: Reported on 02/08/2023)     atomoxetine (STRATTERA) 40 MG capsule Take 40 mg by mouth daily. (Patient not taking:  Reported on 02/08/2023)     cephALEXin  (KEFLEX ) 500 MG capsule Take 1 capsule (500 mg total) by mouth 3 (three) times daily. 21 capsule 0   fluticasone  (FLONASE ) 50 MCG/ACT nasal spray Place 2 sprays into both nostrils in the morning and at bedtime for 15 days. 1 g 0   lidocaine  (HM LIDOCAINE  PATCH) 4 % Place 1 patch onto the skin daily. (Patient not taking: Reported on 08/12/2022) 30 patch 0   LORazepam  (ATIVAN ) 1 MG tablet Take 1 tablet (1 mg total) by mouth once as needed for up to 1 dose for anxiety (Prior to colposcopy procedure). (Patient not taking: Reported on 02/08/2023) 30 tablet 0   metroNIDAZOLE  (FLAGYL ) 500 MG tablet Take 1 tablet (500 mg total) by mouth 2 (two) times daily. 14 tablet 0   norgestimate -ethinyl estradiol  (ORTHO-CYCLEN) 0.25-35 MG-MCG tablet Take 1 tablet by mouth daily. (Patient not taking: Reported on 02/08/2023) 28 tablet 11   No current facility-administered medications for this visit.    Allergies as of 10/17/2023 - Review Complete 10/17/2023  Allergen Reaction Noted   Nitrofurantoin  monohyd macro Nausea And Vomiting 10/31/2014   Macrobid  [nitrofurantoin  monohyd macro] Nausea And Vomiting 10/31/2014   Other Nausea And Vomiting 10/31/2014    Family History  Problem Relation Age of Onset   Asthma Mother  Hypertension Mother    Hypertension Maternal Aunt     Social History   Socioeconomic History   Marital status: Significant Other    Spouse name: Not on file   Number of children: Not on file   Years of education: Not on file   Highest education level: Not on file  Occupational History   Not on file  Tobacco Use   Smoking status: Every Day    Types: Cigars   Smokeless tobacco: Never  Vaping Use   Vaping status: Never Used  Substance and Sexual Activity   Alcohol use: Yes    Comment: Occassional    Drug use: No   Sexual activity: Yes    Birth control/protection: None  Other Topics Concern   Not on file  Social History Narrative   ** Merged  History Encounter **       Social Drivers of Health   Financial Resource Strain: Not on file  Food Insecurity: Not on file  Transportation Needs: Not on file  Physical Activity: Not on file  Stress: Not on file (03/27/2023)  Social Connections: Unknown (10/02/2021)   Received from [facility], [facility]   Social Network    Social Network: Not on file  Intimate Partner Violence: Unknown (08/24/2021)   Received from [employer], [facility]   HITS    Physically Hurt: Not on file    Insult or Talk Down To: Not on file    Threaten Physical Harm: Not on file    Scream or Curse: Not on file    Review of Systems:    Constitutional: No weight loss, fever or chills Skin: No rash Cardiovascular: No chest pain Respiratory: No SOB  Gastrointestinal: See HPI and otherwise negative Genitourinary: No dysuria  Neurological: No headache, dizziness or syncope Musculoskeletal: No new muscle or joint pain Hematologic: No bleeding Psychiatric: No history of depression or anxiety   Physical Exam:  Vital signs: BP (!) 140/80   Pulse 88   Ht 5\' 2"  (1.575 m)   Wt 205 lb (93 kg)   BMI 37.49 kg/m    Constitutional:   Pleasant AA female appears to be in NAD, Well developed, Well nourished, alert and cooperative Head:  Normocephalic and atraumatic. Eyes:   PEERL, EOMI. No icterus. Conjunctiva pink. Ears:  Normal auditory acuity. Neck:  Supple Throat: Oral cavity and pharynx without inflammation, swelling or lesion.  Respiratory: Respirations even and unlabored. Lungs clear to auscultation bilaterally.   No wheezes, crackles, or rhonchi.  Cardiovascular: Normal S1, S2. No MRG. Regular rate and rhythm. No peripheral edema, cyanosis or pallor.  Gastrointestinal:  Soft, nondistended, nontender. No rebound or guarding.  Decreased bowel sounds all 4 quadrants, no appreciable masses or hepatomegaly. Rectal:  Not performed.  Msk:  Symmetrical without gross deformities. Without edema, no  deformity or joint abnormality.  Neurologic:  Alert and  oriented x4;  grossly normal neurologically.  Skin:   Dry and intact without significant lesions or rashes. Psychiatric: Demonstrates good judgement and reason without abnormal affect or behaviors.  RELEVANT LABS AND IMAGING: CBC    Component Value Date/Time   WBC 4.7 06/24/2023 2034   RBC 4.57 06/24/2023 2034   HGB 13.7 06/24/2023 2034   HCT 41.8 06/24/2023 2034   PLT 227 06/24/2023 2034   MCV 91.5 06/24/2023 2034   MCH 30.0 06/24/2023 2034   MCHC 32.8 06/24/2023 2034   RDW 12.3 06/24/2023 2034   LYMPHSABS 1.9 06/24/2023 2034   MONOABS 0.4 06/24/2023  2034   EOSABS 0.1 06/24/2023 2034   BASOSABS 0.0 06/24/2023 2034    CMP     Component Value Date/Time   NA 138 06/24/2023 2034   K 4.0 06/24/2023 2034   CL 106 06/24/2023 2034   CO2 22 06/24/2023 2034   GLUCOSE 121 (H) 06/24/2023 2034   BUN 9 06/24/2023 2034   CREATININE 0.54 06/24/2023 2034   CALCIUM 9.4 06/24/2023 2034   PROT 7.2 02/08/2023 0033   ALBUMIN 3.8 02/08/2023 0033   AST 15 02/08/2023 0033   ALT 15 02/08/2023 0033   ALKPHOS 44 02/08/2023 0033   BILITOT 0.4 02/08/2023 0033   GFRNONAA >60 06/24/2023 2034   GFRAA >60 03/04/2015 1605    Assessment: 1.  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  Started the patient on Linzess 72 mcg daily, 30 minutes for breakfast #30 with 5 refills.  Also provided her with samples. 2.  Patient will let me know how she is doing over MyChart 3.  Patient to follow in clinic in the in 2 to 3 months.  Assigned to Dr. Nandigam today.  Reginal Capra, PA-C [facility] 10/17/2023, 11:05 AM  Cc: No ref. provider found

## 2023-10-17 NOTE — Patient Instructions (Signed)
 We have sent the following medications to your pharmacy for you to pick up at your convenience:  Linzess 72mcg  Linzess works best when taken once a day every day, on an empty stomach, at least 30 minutes before your first meal of the day.  When Linzess is taken daily as directed:  *Constipation relief is typically felt in about a week *IBS-C patients may begin to experience relief from belly pain and overall abdominal symptoms (pain, discomfort, and bloating) in about 1 week,   with symptoms typically improving over 12 weeks.  Diarrhea may occur in the first 2 weeks -keep taking it.  The diarrhea should go away and you should start having normal, complete, full bowel movements. It may be helpful to start treatment when you can be near the comfort of your own bathroom, such as a weekend.   _______________________________________________________  If your blood pressure at your visit was 140/90 or greater, please contact your primary care physician to follow up on this.  _______________________________________________________  If you are age 21 or older, your body mass index should be between 23-30. Your Body mass index is 37.49 kg/m. If this is out of the aforementioned range listed, please consider follow up with your Primary Care Provider.  If you are age 54 or younger, your body mass index should be between 19-25. Your Body mass index is 37.49 kg/m. If this is out of the aformentioned range listed, please consider follow up with your Primary Care Provider.   ________________________________________________________  The [facility] providers would like to encourage you to use MYCHART to communicate with providers for non-urgent requests or questions.  Due to long hold times on the telephone, sending your provider a message by [facility] may be a faster and more efficient way to get a response.  Please allow 48 business hours for a response.  Please remember that this is for non-urgent  requests.  _______________________________________________________

## 2023-12-18 ENCOUNTER — Ambulatory Visit (HOSPITAL_COMMUNITY)
Admission: EM | Admit: 2023-12-18 | Discharge: 2023-12-18 | Disposition: A | Discharge status: Home / Self Care | Attending: Emergency Medicine | Admitting: Emergency Medicine

## 2023-12-18 ENCOUNTER — Encounter (HOSPITAL_COMMUNITY): Payer: Self-pay

## 2023-12-18 DIAGNOSIS — Z3202 Encounter for pregnancy test, result negative: Secondary | ICD-10-CM | POA: Diagnosis present

## 2023-12-18 DIAGNOSIS — R109 Unspecified abdominal pain: Secondary | ICD-10-CM | POA: Insufficient documentation

## 2023-12-18 DIAGNOSIS — Z113 Encounter for screening for infections with a predominantly sexual mode of transmission: Secondary | ICD-10-CM | POA: Diagnosis present

## 2023-12-18 LAB — POCT URINALYSIS DIP (MANUAL ENTRY)
Bilirubin, UA: NEGATIVE
Glucose, UA: NEGATIVE mg/dL
Ketones, POC UA: NEGATIVE mg/dL
Leukocytes, UA: NEGATIVE
Nitrite, UA: NEGATIVE
Spec Grav, UA: >=1.03 — AB (ref 1.010–1.025)
Urobilinogen, UA: 0.2 U/dL
pH, UA: 6 (ref 5.0–8.0)

## 2023-12-18 LAB — HIV ANTIBODY (ROUTINE TESTING W REFLEX): HIV Screen 4th Generation wRfx: NONREACTIVE

## 2023-12-18 NOTE — ED Provider Notes (Signed)
 [facility]    CSN: [id number] Arrival date & time: 12/18/23  1750      History   Chief Complaint Chief Complaint  Patient presents with   Fatigue   Nausea   Abdominal Pain    HPI Heidi Estes is a 28 y.o. female.   Patient presents to clinic over concern of fatigue, nausea, lower abdominal pain and cramping for the past 2 months.  She did recently get a new sexual partner over the past 4 months.  Has been having unprotected intercourse.  Is concerned because her recent menstrual cycle was lighter than usual and dark brown blood.  Was having some cramping.  No vomiting.  No fevers.  Endorses dysuria.  Without urgency, frequency, flank pain or fevers.  The history is provided by the patient and medical records.  Abdominal Pain   Past Medical History:  Diagnosis Date   Multiple allergies    Postpartum depression    STD (female)     Patient Active Problem List   Diagnosis Date Noted   ASCUS with positive high risk HPV cervical 08/23/2022    Past Surgical History:  Procedure Laterality Date   COSMETIC SURGERY N/A    tummy tuck   WISDOM TOOTH EXTRACTION Bilateral 07/2013   x4    OB History     Gravida  3   Para  2   Term  2   Preterm  0   AB  1   Living  2      SAB  1   IAB  0   Ectopic  0   Multiple      Live Births  2            Home Medications    Prior to Admission medications   Medication Sig Start Date End Date Taking? Authorizing Provider  terconazole  (TERAZOL 7 ) 0.4 % vaginal cream Place 1 applicator vaginally at bedtime. Use for 7 days Patient not taking: Reported on 08/23/2022 08/14/22   Ndulue, Chiagoziem J, MD  ARIPiprazole (ABILIFY) 5 MG tablet Take 5 mg by mouth at bedtime. Patient not taking: Reported on 02/08/2023 12/17/22   [provider]  atomoxetine (STRATTERA) 40 MG capsule Take 40 mg by mouth daily. Patient not taking: Reported on 02/08/2023 12/16/22   [provider]  cephALEXin   (KEFLEX ) 500 MG capsule Take 1 capsule (500 mg total) by mouth 3 (three) times daily. Patient not taking: Reported on 10/17/2023 06/25/23   Carita Senior, MD  fluticasone  (FLONASE ) 50 MCG/ACT nasal spray Place 2 sprays into both nostrils in the morning and at bedtime for 15 days. Patient not taking: Reported on 12/18/2023 06/30/21 07/15/21  Elnor Hila P, DO  lidocaine  (HM LIDOCAINE  PATCH) 4 % Place 1 patch onto the skin daily. Patient not taking: Reported on 08/12/2022 05/21/22   Francesca Elsie CROME, MD  linaclotide  (LINZESS ) 72 MCG capsule Take 1 capsule (72 mcg total) by mouth daily before breakfast. Patient not taking: Reported on 12/18/2023 10/17/23 10/11/24  Beather Delon Gibson, PA  LORazepam  (ATIVAN ) 1 MG tablet Take 1 tablet (1 mg total) by mouth once as needed for up to 1 dose for anxiety (Prior to colposcopy procedure). Patient not taking: Reported on 02/08/2023 08/23/22   Richie Harlene RODES, DO  metroNIDAZOLE  (FLAGYL ) 500 MG tablet Take 1 tablet (500 mg total) by mouth 2 (two) times daily. Patient not taking: Reported on 10/17/2023 06/25/23   Carita Senior, MD  norgestimate -ethinyl estradiol  (ORTHO-CYCLEN) 0.25-35 MG-MCG tablet  Take 1 tablet by mouth daily. Patient not taking: Reported on 02/08/2023 08/12/22   Ndulue, Ethlyn PARAS, MD    Family History Family History  Problem Relation Age of Onset   Asthma Mother    Hypertension Mother    Hypertension Maternal Aunt    Liver disease Neg Hx    Colon cancer Neg Hx    Esophageal cancer Neg Hx     Social History Social History   Tobacco Use   Smoking status: Former    Types: Cigars   Smokeless tobacco: Never  Vaping Use   Vaping status: Never Used  Substance Use Topics   Alcohol use: Yes    Comment: Occassional    Drug use: No     Allergies   Nitrofurantoin  monohyd macro, Macrobid  [nitrofurantoin  monohyd macro], and Other   Review of Systems Review of Systems  Per HPI  Physical Exam Triage Vital Signs ED Triage  Vitals [12/18/23 1914]  Encounter Vitals Group     BP 110/82     Girls Systolic BP Percentile      Girls Diastolic BP Percentile      Boys Systolic BP Percentile      Boys Diastolic BP Percentile      Pulse Rate 85     Resp 14     Temp 97.9 F (36.6 C)     Temp Source Oral     SpO2 97 %     Weight      Height      Head Circumference      Peak Flow      Pain Score 4     Pain Loc      Pain Education      Exclude from Growth Chart    No data found.  Updated Vital Signs BP 110/82 (BP Location: Left Arm)   Pulse 85   Temp 97.9 F (36.6 C) (Oral)   Resp 14   LMP 12/14/2023   SpO2 97%   Visual Acuity Right Eye Distance:   Left Eye Distance:   Bilateral Distance:    Right Eye Near:   Left Eye Near:    Bilateral Near:     Physical Exam Vitals and nursing note reviewed.  Constitutional:      Appearance: She is well-developed.  HENT:     Head: Normocephalic and atraumatic.  Cardiovascular:     Rate and Rhythm: Normal rate.  Pulmonary:     Effort: Pulmonary effort is normal. No respiratory distress.  Abdominal:     General: Abdomen is flat. Bowel sounds are normal.     Palpations: Abdomen is soft.     Tenderness: There is no abdominal tenderness. There is no guarding or rebound.  Skin:    General: Skin is warm and dry.  Neurological:     General: No focal deficit present.     Mental Status: She is alert and oriented to person, place, and time.  Psychiatric:        Mood and Affect: Mood normal.        Behavior: Behavior normal.      UC Treatments / Results  Labs (all labs ordered are listed, but only abnormal results are displayed) Labs Reviewed  POCT URINALYSIS DIP (MANUAL ENTRY) - Abnormal; Notable for the following components:      Result Value   Color, UA other (*)    Clarity, UA cloudy (*)    Spec Grav, UA >=1.030 (*)    Blood, UA moderate (*)  Protein Ur, POC trace (*)    All other components within normal limits  HIV ANTIBODY (ROUTINE  TESTING W REFLEX)  RPR  POCT URINE PREGNANCY  CERVICOVAGINAL ANCILLARY ONLY    EKG   Radiology No results found.  Procedures Procedures (including critical care time)  Medications Ordered in UC Medications - No data to display  Initial Impression / Assessment and Plan / UC Course  I have reviewed the triage vital signs and the nursing notes.  Pertinent labs & imaging results that were available during my care of the patient were reviewed by me and considered in my medical decision making (see chart for details).  Vitals and triage reviewed, patient is hemodynamically stable.  Abdomen is soft and nontender with active bowel sounds.  UA does not reveal obvious infection, does not have red blood cells.  Urine pregnancy negative.  Abdomen is soft and nontender with active bowel sounds, low concern for PID at this time.  Patient mainly concerned over STIs.  Cytology swab obtained as well as HIV and syphilis screening.  Staff will contact if treatment is indicated based on results.  Plan of care, follow-up care return precautions given, no questions at this time.     Final Clinical Impressions(s) / UC Diagnoses   Final diagnoses:  Abdominal pain, unspecified abdominal location  Encounter for screening examination for sexually transmitted infection  Urine pregnancy test negative     Discharge Instructions      Your urine did not show evidence of infection.  Urine pregnancy test was negative.  You have been screened for vaginal infections and sexually transmitted infections.  Staff will contact you if treatment is indicated based on results.  Abstain from intercourse until all results have been received.  Return to clinic for any new or urgent symptoms.     ED Prescriptions   None    PDMP not reviewed this encounter.   Dreama, Delilah Mulgrew  N, FNP 12/18/23 2018

## 2023-12-18 NOTE — Discharge Instructions (Addendum)
 Your urine did not show evidence of infection.  Urine pregnancy test was negative.  You have been screened for vaginal infections and sexually transmitted infections.  Staff will contact you if treatment is indicated based on results.  Abstain from intercourse until all results have been received.  Return to clinic for any new or urgent symptoms.

## 2023-12-18 NOTE — ED Triage Notes (Signed)
 Patient reports that she has had fatigue, nause, and lwer abdominal cramping x 2 months. Patient states it has mostly been constant. Patient also reports that she normally has heavy periods, but when she started on 7/27 it was a small amount of brown.  Patient states she has been taking Advil  periodically for abdominal cramping.

## 2023-12-19 ENCOUNTER — Ambulatory Visit (HOSPITAL_COMMUNITY): Payer: Self-pay

## 2023-12-19 LAB — CERVICOVAGINAL ANCILLARY ONLY
Bacterial Vaginitis (gardnerella): NEGATIVE
Candida Glabrata: NEGATIVE
Candida Vaginitis: POSITIVE — AB
Chlamydia: NEGATIVE
Comment: NEGATIVE
Comment: NEGATIVE
Comment: NEGATIVE
Comment: NEGATIVE
Comment: NEGATIVE
Comment: NORMAL
Neisseria Gonorrhea: NEGATIVE
Trichomonas: NEGATIVE

## 2023-12-19 LAB — RPR: RPR Ser Ql: NONREACTIVE

## 2023-12-19 MED ORDER — FLUCONAZOLE 150 MG PO TABS: 150.0000 mg | ORAL_TABLET | Freq: Once | ORAL | 0 refills | Status: AC

## 2024-01-14 ENCOUNTER — Ambulatory Visit: Admitting: Physician Assistant

## 2024-02-08 IMAGING — CR DG CHEST 2V
2 series · 2 of 2 positions shown · non-contrast
Comparison: 07/14/2021

CLINICAL DATA: Cough

EXAM:
CHEST - 2 VIEW

[w chest pa]
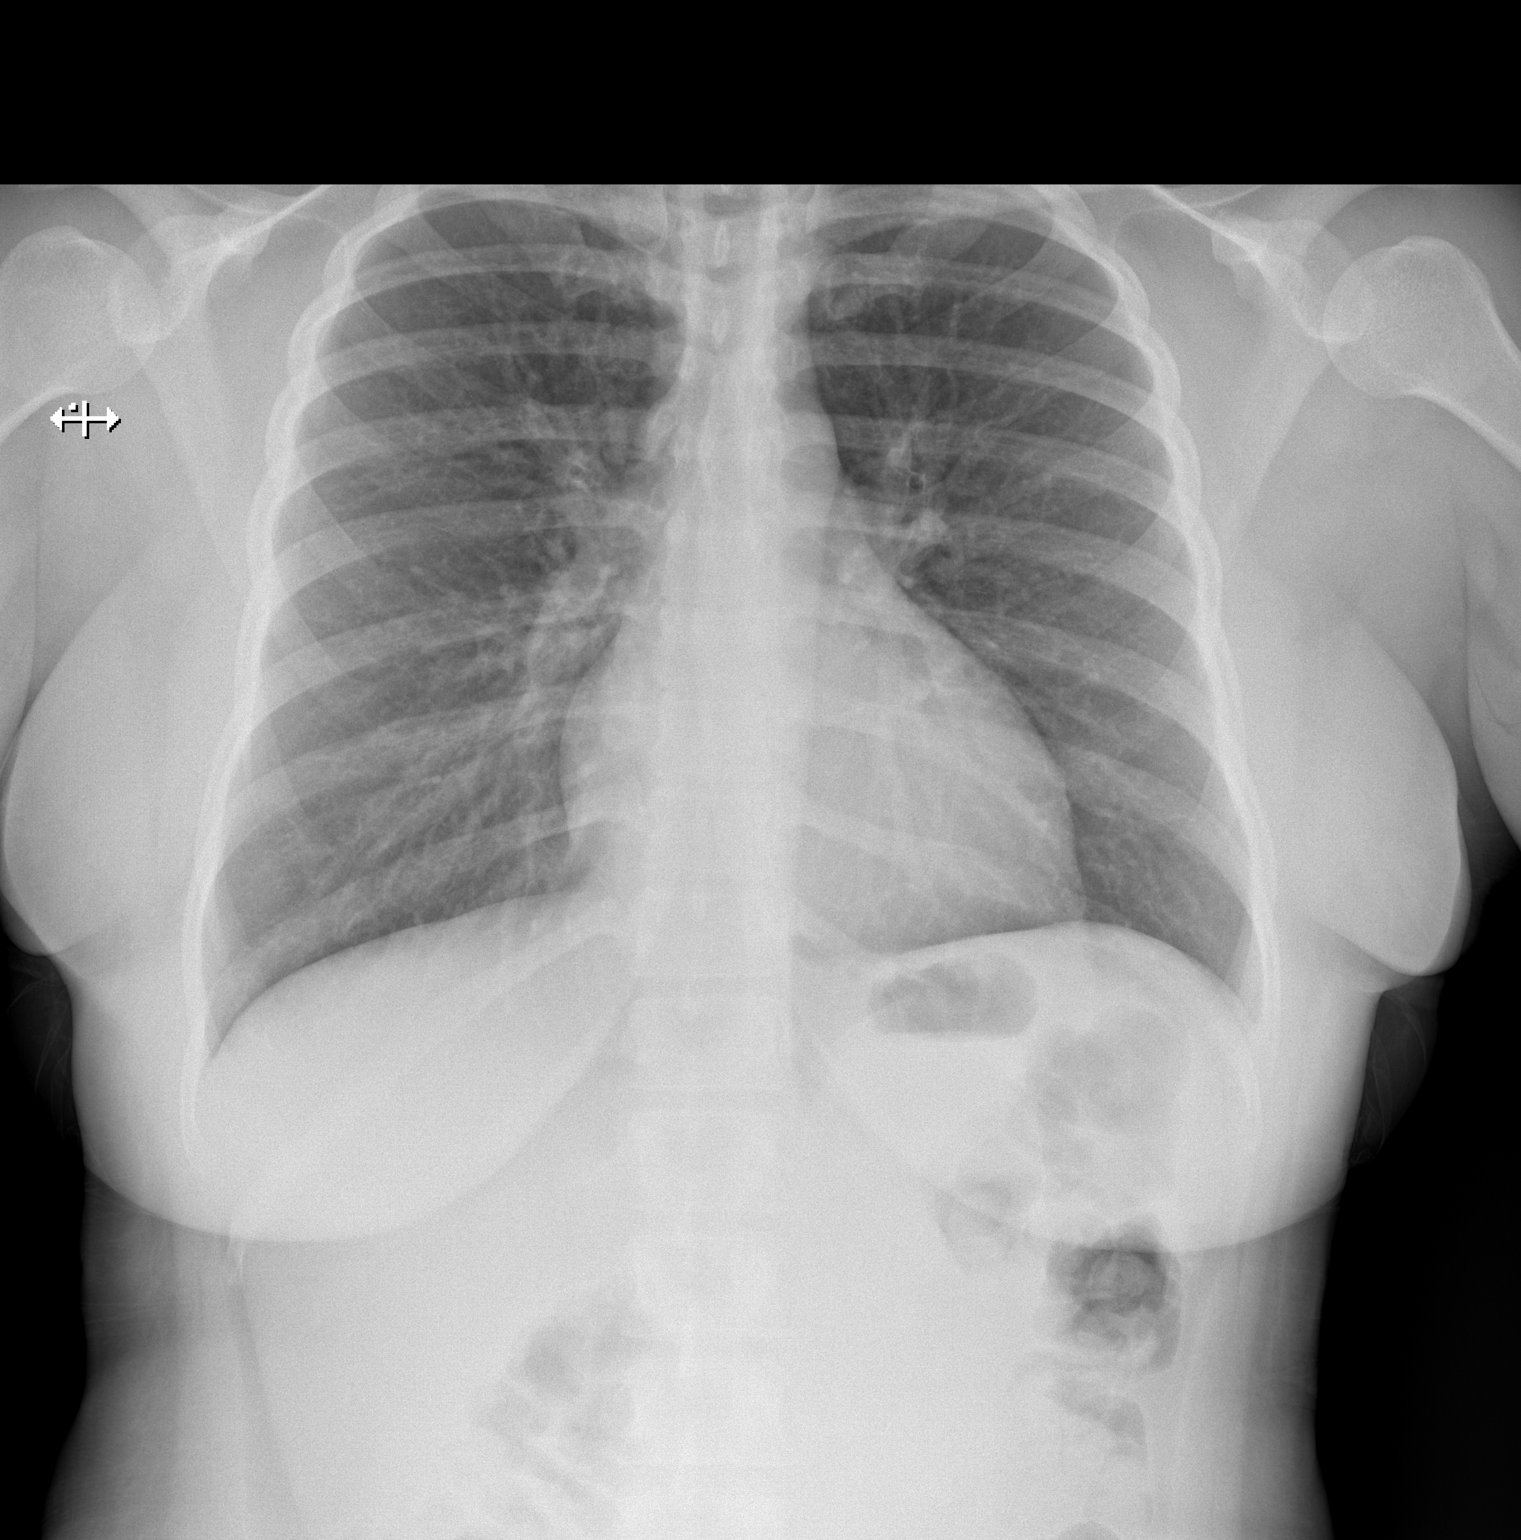

[w chest lat]
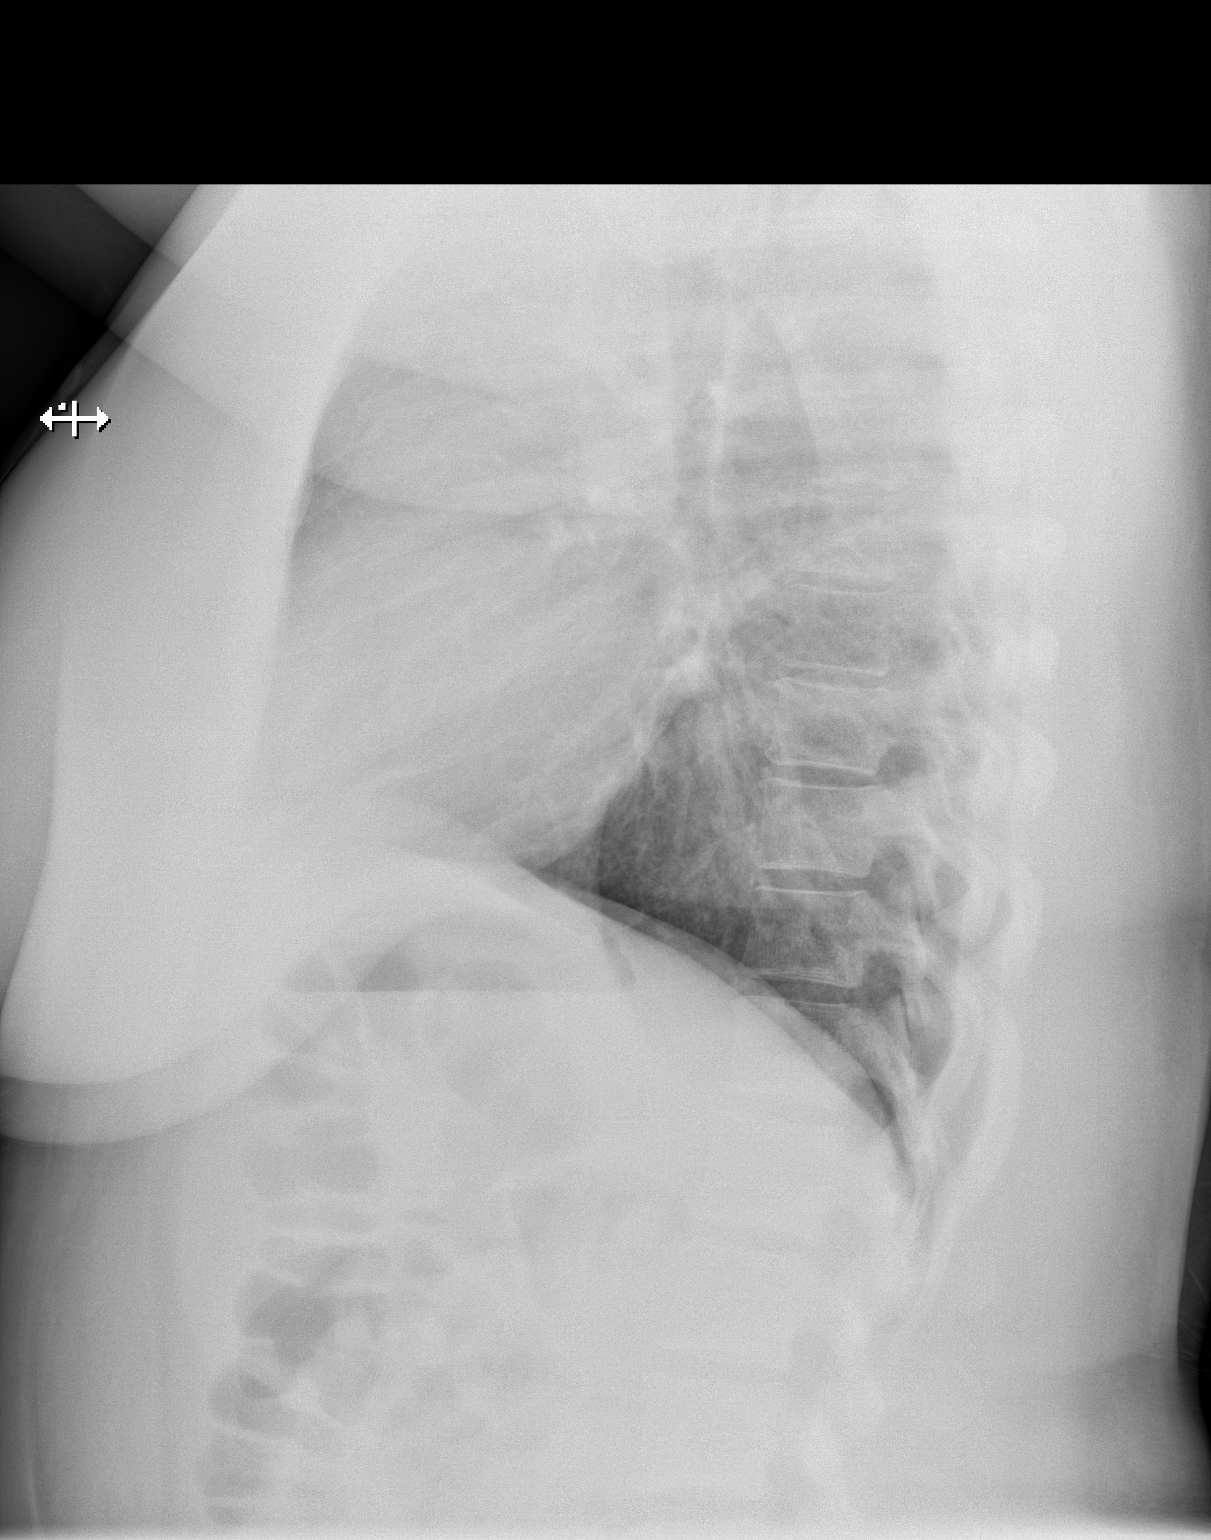

[2 of 2 positions shown; findings below may reference images not displayed]

FINDINGS: The heart size and mediastinal contours are within normal limits.
Both lungs are clear. The visualized skeletal structures are
unremarkable.
IMPRESSION: No active cardiopulmonary disease.

## 2024-02-18 ENCOUNTER — Emergency Department (HOSPITAL_COMMUNITY)
Admission: EM | Admit: 2024-02-18 | Discharge: 2024-02-18 | Disposition: A | Discharge status: Home / Self Care | Attending: Emergency Medicine | Admitting: Emergency Medicine

## 2024-02-18 ENCOUNTER — Encounter (HOSPITAL_COMMUNITY): Payer: Self-pay | Admitting: Emergency Medicine

## 2024-02-18 ENCOUNTER — Other Ambulatory Visit: Payer: Self-pay

## 2024-02-18 ENCOUNTER — Emergency Department (HOSPITAL_COMMUNITY)

## 2024-02-18 DIAGNOSIS — J069 Acute upper respiratory infection, unspecified: Secondary | ICD-10-CM | POA: Diagnosis not present

## 2024-02-18 DIAGNOSIS — Z114 Encounter for screening for human immunodeficiency virus [HIV]: Secondary | ICD-10-CM | POA: Diagnosis not present

## 2024-02-18 DIAGNOSIS — R059 Cough, unspecified: Secondary | ICD-10-CM | POA: Diagnosis present

## 2024-02-18 LAB — HIV ANTIBODY (ROUTINE TESTING W REFLEX): HIV Screen 4th Generation wRfx: NONREACTIVE

## 2024-02-18 LAB — RESP PANEL BY RT-PCR (RSV, FLU A&B, COVID)  RVPGX2
Influenza A by PCR: NEGATIVE
Influenza B by PCR: NEGATIVE
Resp Syncytial Virus by PCR: NEGATIVE
SARS Coronavirus 2 by RT PCR: NEGATIVE

## 2024-02-18 LAB — RPR: RPR Ser Ql: NONREACTIVE

## 2024-02-18 MED ORDER — BICTEGRAVIR-EMTRICITAB-TENOFOV 50-200-25 MG PO TABS: 1.0000 | ORAL_TABLET | Freq: Every day | ORAL | 0 refills | Status: AC

## 2024-02-18 MED ORDER — BICTEGRAVIR-EMTRICITAB-TENOFOV 50-200-25 MG PO PREPACK
1.0000 | ORAL_TABLET | Freq: Every day | ORAL | Status: DC
  Administered 2024-02-18: 1 via ORAL
  Filled 2024-02-18: qty 1

## 2024-02-18 MED ORDER — ONDANSETRON 4 MG PO TBDP: 4.0000 mg | ORAL_TABLET | Freq: Three times a day (TID) | ORAL | 0 refills | Status: AC | PRN

## 2024-02-18 NOTE — ED Triage Notes (Signed)
 Pt presents to the ED via POV with complaints of nasal congestion, body aches, and cough x 2 days. She notes her children are sick with a virus.  A&Ox4 at this time. Denies fevers, CP or SOB.

## 2024-02-18 NOTE — [facility] Notes (Signed)
 RNCM notified of consult and will assist as soon as she is able.

## 2024-02-18 NOTE — ED Provider Notes (Addendum)
     Upon discharge, patient requesting post-exposure HIV medications. Patient unsure if intercourse partner from yesterday has HIV, but doesn't trust them and would like to start the medication today. Patient educated that we will obtain labs today and she will need to follow up with PCP in the next 2 days for results on these labs.   Patient otherwise without vomiting, diarrhea, abdominal pain, or vaginal discharge.  I spoke with Pharmacist who was able to help walk me through ordering these medications for the patient.    Heidi Estes, [place] 02/18/24 Heidi Estes    Griselda Norris, MD 02/18/24 [phone]

## 2024-02-18 NOTE — ED Provider Notes (Signed)
 [place] [facility] AT [facility] Provider Note   CSN: [id number] Arrival date & time: 02/18/24  9875     Patient presents with: Nasal Congestion   Heidi Estes is a 28 y.o. female.   The history is provided by the patient.   Heidi Estes is a 28 y.o. female who presents to the Emergency Department complaining of sick. She presents the emergency department for evaluation of stuffy nose, headache, bodyaches, nausea and cough. Symptoms started two days ago. Overall her cough is getting better. No vomiting, dysuria, leg swelling or pain. No known medical problems. No chance of pregnancy. She does request HIV testing.     Prior to Admission medications   Medication Sig Start Date End Date Taking? Authorizing Provider  ondansetron  (ZOFRAN -ODT) 4 MG disintegrating tablet Take 1 tablet (4 mg total) by mouth every 8 (eight) hours as needed. 02/18/24  Yes Griselda Norris, MD  terconazole  (TERAZOL 7 ) 0.4 % vaginal cream Place 1 applicator vaginally at bedtime. Use for 7 days Patient not taking: Reported on 08/23/2022 08/14/22   Ndulue, Chiagoziem J, MD  ARIPiprazole (ABILIFY) 5 MG tablet Take 5 mg by mouth at bedtime. Patient not taking: Reported on 02/08/2023 12/17/22   [provider]  atomoxetine (STRATTERA) 40 MG capsule Take 40 mg by mouth daily. Patient not taking: Reported on 02/08/2023 12/16/22   [provider]  cephALEXin  (KEFLEX ) 500 MG capsule Take 1 capsule (500 mg total) by mouth 3 (three) times daily. Patient not taking: Reported on 10/17/2023 06/25/23   Carita Senior, MD  fluticasone  (FLONASE ) 50 MCG/ACT nasal spray Place 2 sprays into both nostrils in the morning and at bedtime for 15 days. Patient not taking: Reported on 12/18/2023 06/30/21 07/15/21  Elnor Hila P, DO  lidocaine  (HM LIDOCAINE  PATCH) 4 % Place 1 patch onto the skin daily. Patient not taking: Reported on 08/12/2022 05/21/22   Francesca Elsie CROME, MD  linaclotide  (LINZESS )  72 MCG capsule Take 1 capsule (72 mcg total) by mouth daily before breakfast. Patient not taking: Reported on 12/18/2023 10/17/23 10/11/24  Beather Delon Gibson, PA  LORazepam  (ATIVAN ) 1 MG tablet Take 1 tablet (1 mg total) by mouth once as needed for up to 1 dose for anxiety (Prior to colposcopy procedure). Patient not taking: Reported on 02/08/2023 08/23/22   Richie Harlene RODES, DO  metroNIDAZOLE  (FLAGYL ) 500 MG tablet Take 1 tablet (500 mg total) by mouth 2 (two) times daily. Patient not taking: Reported on 10/17/2023 06/25/23   Carita Senior, MD  norgestimate -ethinyl estradiol  (ORTHO-CYCLEN) 0.25-35 MG-MCG tablet Take 1 tablet by mouth daily. Patient not taking: Reported on 02/08/2023 08/12/22   Ndulue, Chiagoziem J, MD    Allergies: Nitrofurantoin  monohyd macro, Macrobid  [nitrofurantoin  monohyd macro], and Other    Review of Systems  All other systems reviewed and are negative.   Updated Vital Signs BP (!) 134/100 (BP Location: Right Arm)   Pulse (!) 101   Temp 98.5 F (36.9 C) (Oral)   Resp 20   Ht 5' 2 (1.575 m)   Wt 94.2 kg   LMP 02/16/2024 (Exact Date)   SpO2 100%   BMI 37.98 kg/m   Physical Exam Vitals and nursing note reviewed.  Constitutional:      Appearance: She is well-developed.  HENT:     Head: Normocephalic and atraumatic.     Nose: Congestion and rhinorrhea present.     Mouth/Throat:     Mouth: Mucous membranes are moist.  Pharynx: No posterior oropharyngeal erythema.  Cardiovascular:     Rate and Rhythm: Normal rate and regular rhythm.     Heart sounds: No murmur heard. Pulmonary:     Effort: Pulmonary effort is normal. No respiratory distress.     Breath sounds: Normal breath sounds.  Abdominal:     Palpations: Abdomen is soft.     Tenderness: There is no abdominal tenderness. There is no guarding or rebound.  Musculoskeletal:        General: No tenderness.  Skin:    General: Skin is warm and dry.  Neurological:     Mental Status: She is  alert and oriented to person, place, and time.  Psychiatric:        Behavior: Behavior normal.     (all labs ordered are listed, but only abnormal results are displayed) Labs Reviewed  RESP PANEL BY RT-PCR (RSV, FLU A&B, COVID)  [id number]  HIV ANTIBODY (ROUTINE TESTING W REFLEX)    EKG: None  Radiology: DG Chest 2 View Result Date: 02/18/2024 EXAM: 2 VIEW(S) XRAY OF THE CHEST 02/18/2024 02:18:47 AM COMPARISON: 06/25/2023 CLINICAL HISTORY: cough. Nasal congestion, cough and body aches for 2 days FINDINGS: LUNGS AND PLEURA: No focal pulmonary opacity. No pulmonary edema. No pleural effusion. No pneumothorax. HEART AND MEDIASTINUM: No acute abnormality of the cardiac and mediastinal silhouettes. BONES AND SOFT TISSUES: No acute osseous abnormality. IMPRESSION: 1. No acute abnormalities. Electronically signed by: Norman Gatlin MD 02/18/2024 02:31 AM EDT RP Workstation: [id number]     Procedures   Medications Ordered in the ED - No data to display                                  Medical Decision Making Amount and/or Complexity of Data Reviewed Radiology: ordered.  Risk Prescription drug management.   Patient without significant medical history here for evaluation of runny nose, stuffy nose, cough and congestion.. Her children are currently sick with similar symptoms. She is non-toxic appearing on evaluation with no respiratory distress. Lungs are clear. Current picture is not consistent with pneumonia, acute CHF, PE. She is negative for COVID and flu. Discussed viral URI and home care for symptomatic treatment. She is also requesting HIV testing. Will send. Discussed outpatient follow-up for URI as well as return precautions.     Final diagnoses:  Viral URI    ED Discharge Orders          Ordered    ondansetron  (ZOFRAN -ODT) 4 MG disintegrating tablet  Every 8 hours PRN        02/18/24 0415               Griselda Norris, MD 02/18/24 0425

## 2024-02-18 NOTE — Discharge Instructions (Addendum)
 As discussed, you will need to follow up with primary care on the results of today's lab tests.

## 2024-02-18 NOTE — ED Notes (Signed)
 Pt declined further labs and urine stated she only wanted prep medication. PA made aware. Pt was monitored for after intake. No reaction observed by RN.

## 2024-03-08 ENCOUNTER — Ambulatory Visit: Admitting: Physician Assistant

## 2024-03-08 NOTE — Progress Notes (Deleted)
 Chief Complaint: Follow-up constipation and bloating  HPI:    Heidi Estes is a 29 year old female, assigned to Dr. Nandigam, with a past medical history as listed below who presents to clinic today for follow-up of constipation and bloating.     06/24/23 patient seen in the ED for atypical chest pain.  At that time noted some right sided headache, left-sided chest pain, vaginal discharge and dysuria.  At that time told to follow-up with gynecologist.  CBC and BMP normal.     10/17/23 patient seen in clinic by me and at that time discussed constipation for her whole life.  At that point started on Linzess  72 mcg daily.  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 Use for 7 days (Patient not taking: Reported on 08/23/2022) 45 g 0   ARIPiprazole (ABILIFY) 5 MG tablet Take 5 mg by mouth at bedtime. (Patient not taking: Reported on 02/08/2023)     atomoxetine (STRATTERA) 40 MG capsule Take 40 mg by mouth daily. (Patient not taking: Reported on 02/08/2023)     bictegravir-emtricitabine-tenofovir AF (BIKTARVY) 50-200-25 MG TABS tablet Take 1 tablet by mouth daily. 30 tablet 0   cephALEXin  (KEFLEX ) 500 MG capsule Take 1 capsule (500 mg total) by mouth 3 (three) times daily. (Patient not taking: Reported on 10/17/2023) 21 capsule 0   fluticasone  (FLONASE ) 50 MCG/ACT nasal spray Place 2 sprays into both nostrils in the morning and at bedtime for 15 days. (Patient not taking: Reported on 12/18/2023) 1 g 0   lidocaine  (HM LIDOCAINE  PATCH) 4 % Place 1 patch onto the skin daily. (Patient not taking: Reported on 08/12/2022) 30 patch 0   linaclotide  (LINZESS ) 72 MCG  capsule Take 1 capsule (72 mcg total) by mouth daily before breakfast. (Patient not taking: Reported on 12/18/2023) 30 capsule 5   LORazepam  (ATIVAN ) 1 MG tablet Take 1 tablet (1 mg total) by mouth once as needed for up to 1 dose for anxiety (Prior to colposcopy procedure). (Patient not taking: Reported on 02/08/2023) 30 tablet 0   metroNIDAZOLE  (FLAGYL ) 500 MG tablet Take 1 tablet (500 mg total) by mouth 2 (two) times daily. (Patient not taking: Reported on 10/17/2023) 14 tablet 0   norgestimate -ethinyl estradiol  (ORTHO-CYCLEN) 0.25-35 MG-MCG tablet Take 1 tablet by mouth daily. (Patient not taking: Reported on 02/08/2023) 28 tablet 11   ondansetron  (ZOFRAN -ODT) 4 MG disintegrating tablet Take 1 tablet (4 mg total) by mouth every 8 (eight) hours as needed. 12 tablet 0   No current facility-administered medications for this visit.    Allergies as of 03/08/2024 - Review Complete 02/18/2024  Allergen Reaction Noted   Nitrofurantoin  monohyd macro Nausea And Vomiting 10/31/2014   Macrobid  [nitrofurantoin  monohyd macro] Nausea And Vomiting 10/31/2014   Other Nausea And Vomiting 10/31/2014    Family History  Problem Relation Age of Onset   Asthma Mother    Hypertension Mother    Hypertension Maternal Aunt    Liver disease Neg Hx    Colon cancer Neg Hx    Esophageal cancer Neg Hx     Social History   Socioeconomic  History   Marital status: Significant Other    Spouse name: Not on file   Number of children: 2   Years of education: Not on file   Highest education level: Not on file  Occupational History   Occupation: self employed  Tobacco Use   Smoking status: Former    Types: Cigars   Smokeless tobacco: Never  Vaping Use   Vaping status: Never Used  Substance and Sexual Activity   Alcohol use: Yes    Comment: Occassional    Drug use: No   Sexual activity: Yes    Birth control/protection: None  Other Topics Concern   Not on file  Social History Narrative   ** Merged History  Encounter **       Social Drivers of Health   Financial Resource Strain: Not on file  Food Insecurity: Not on file  Transportation Needs: Not on file  Physical Activity: Not on file  Stress: Not on file (03/27/2023)  Social Connections: Unknown (10/02/2021)   Received from [facility]   Social Network    Social Network: Not on file  Intimate Partner Violence: Unknown (08/24/2021)   Received from [facility]   HITS    Physically Hurt: Not on file    Insult or Talk Down To: Not on file    Threaten Physical Harm: Not on file    Scream or Curse: Not on file    Review of Systems:    Constitutional: No weight loss, fever, chills, weakness or fatigue HEENT: Eyes: No change in vision               Ears, Nose, Throat:  No change in hearing or congestion Skin: No rash or itching Cardiovascular: No chest pain, chest pressure or palpitations   Respiratory: No SOB or cough Gastrointestinal: See HPI and otherwise negative Genitourinary: No dysuria or change in urinary frequency Neurological: No headache, dizziness or syncope Musculoskeletal: No new muscle or joint pain Hematologic: No bleeding or bruising Psychiatric: No history of depression or anxiety    Physical Exam:  Vital signs: LMP 02/16/2024 (Exact Date)   Constitutional:   Pleasant Caucasian female appears to be in NAD, Well developed, Well nourished, alert and cooperative Head:  Normocephalic and atraumatic. Eyes:   PEERL, EOMI. No icterus. Conjunctiva pink. Ears:  Normal auditory acuity. Neck:  Supple Throat: Oral cavity and pharynx without inflammation, swelling or lesion.  Respiratory: Respirations even and unlabored. Lungs clear to auscultation bilaterally.   No wheezes, crackles, or rhonchi.  Cardiovascular: Normal S1, S2. No MRG. Regular rate and rhythm. No peripheral edema, cyanosis or pallor.  Gastrointestinal:  Soft, nondistended, nontender. No rebound or guarding. Normal bowel sounds. No appreciable masses or  hepatomegaly. Rectal:  Not performed.  Msk:  Symmetrical without gross deformities. Without edema, no deformity or joint abnormality.  Neurologic:  Alert and  oriented x4;  grossly normal neurologically.  Skin:   Dry and intact without significant lesions or rashes. Psychiatric: Oriented to person, place and time. Demonstrates good judgement and reason without abnormal affect or behaviors.  RELEVANT LABS AND IMAGING: CBC    Component Value Date/Time   WBC 4.7 06/24/2023 2034   RBC 4.57 06/24/2023 2034   HGB 13.7 06/24/2023 2034   HCT 41.8 06/24/2023 2034   PLT 227 06/24/2023 2034   MCV 91.5 06/24/2023 2034   MCH 30.0 06/24/2023 2034   MCHC 32.8 06/24/2023 2034   RDW 12.3 06/24/2023 2034   LYMPHSABS 1.9 06/24/2023 2034  MONOABS 0.4 06/24/2023 2034   EOSABS 0.1 06/24/2023 2034   BASOSABS 0.0 06/24/2023 2034    CMP     Component Value Date/Time   NA 138 06/24/2023 2034   K 4.0 06/24/2023 2034   CL 106 06/24/2023 2034   CO2 22 06/24/2023 2034   GLUCOSE 121 (H) 06/24/2023 2034   BUN 9 06/24/2023 2034   CREATININE 0.54 06/24/2023 2034   CALCIUM 9.4 06/24/2023 2034   PROT 7.2 02/08/2023 0033   ALBUMIN 3.8 02/08/2023 0033   AST 15 02/08/2023 0033   ALT 15 02/08/2023 0033   ALKPHOS 44 02/08/2023 0033   BILITOT 0.4 02/08/2023 0033   GFRNONAA >60 06/24/2023 2034   GFRAA >60 03/04/2015 1605    Assessment: 1.  Constipation and bloating:  Plan: 1. ***     Delon Failing, PA-C [facility] 03/08/2024, 10:10 AM  Cc: No ref. provider found
